# Patient Record
Sex: Female | Born: 1965
Health system: Southern US, Community
[De-identification: ages and names within clinical notes are randomized; demographics above are authoritative.]

## PROBLEM LIST (undated history)

## (undated) DIAGNOSIS — F329 Major depressive disorder, single episode, unspecified: Secondary | ICD-10-CM

## (undated) DIAGNOSIS — K219 Gastro-esophageal reflux disease without esophagitis: Secondary | ICD-10-CM

## (undated) DIAGNOSIS — K439 Ventral hernia without obstruction or gangrene: Secondary | ICD-10-CM

## (undated) DIAGNOSIS — F419 Anxiety disorder, unspecified: Secondary | ICD-10-CM

## (undated) DIAGNOSIS — J449 Chronic obstructive pulmonary disease, unspecified: Secondary | ICD-10-CM

## (undated) DIAGNOSIS — F32A Depression, unspecified: Secondary | ICD-10-CM

## (undated) DIAGNOSIS — E039 Hypothyroidism, unspecified: Secondary | ICD-10-CM

## (undated) DIAGNOSIS — L309 Dermatitis, unspecified: Secondary | ICD-10-CM

## (undated) HISTORY — DX: Anxiety disorder, unspecified: F41.9

## (undated) HISTORY — DX: Gastro-esophageal reflux disease without esophagitis: K21.9

## (undated) HISTORY — PX: TUBAL LIGATION: SHX77

## (undated) HISTORY — DX: Dermatitis, unspecified: L30.9

## (undated) HISTORY — PX: CHOLECYSTECTOMY: SHX55

## (undated) HISTORY — DX: Major depressive disorder, single episode, unspecified: F32.9

## (undated) HISTORY — DX: Depression, unspecified: F32.A

## (undated) HISTORY — DX: Hypothyroidism, unspecified: E03.9

---

## 2005-03-15 ENCOUNTER — Ambulatory Visit: Payer: Self-pay | Admitting: Internal Medicine

## 2005-04-24 ENCOUNTER — Ambulatory Visit: Payer: Self-pay | Admitting: *Deleted

## 2005-04-24 ENCOUNTER — Ambulatory Visit: Payer: Self-pay | Admitting: Internal Medicine

## 2005-05-23 ENCOUNTER — Ambulatory Visit: Payer: Self-pay | Admitting: Obstetrics & Gynecology

## 2005-05-29 ENCOUNTER — Ambulatory Visit (HOSPITAL_COMMUNITY): Admission: RE | Admit: 2005-05-29 | Discharge: 2005-05-29 | Payer: Self-pay | Admitting: Obstetrics and Gynecology

## 2005-06-20 ENCOUNTER — Ambulatory Visit: Payer: Self-pay | Admitting: Obstetrics & Gynecology

## 2005-06-26 ENCOUNTER — Ambulatory Visit (HOSPITAL_COMMUNITY): Admission: RE | Admit: 2005-06-26 | Discharge: 2005-06-26 | Payer: Self-pay | Admitting: *Deleted

## 2005-06-26 ENCOUNTER — Ambulatory Visit: Payer: Self-pay | Admitting: *Deleted

## 2005-07-10 ENCOUNTER — Ambulatory Visit (HOSPITAL_COMMUNITY): Admission: RE | Admit: 2005-07-10 | Discharge: 2005-07-10 | Payer: Self-pay | Admitting: *Deleted

## 2005-07-18 ENCOUNTER — Ambulatory Visit: Payer: Self-pay | Admitting: *Deleted

## 2005-08-09 ENCOUNTER — Ambulatory Visit: Payer: Self-pay | Admitting: Family Medicine

## 2005-09-06 ENCOUNTER — Ambulatory Visit: Payer: Self-pay | Admitting: Family Medicine

## 2005-09-26 ENCOUNTER — Ambulatory Visit: Payer: Self-pay | Admitting: *Deleted

## 2005-10-10 ENCOUNTER — Ambulatory Visit: Payer: Self-pay | Admitting: *Deleted

## 2005-10-24 ENCOUNTER — Ambulatory Visit: Payer: Self-pay | Admitting: Family Medicine

## 2005-11-14 ENCOUNTER — Ambulatory Visit: Payer: Self-pay | Admitting: Obstetrics & Gynecology

## 2005-11-21 ENCOUNTER — Inpatient Hospital Stay (HOSPITAL_COMMUNITY): Admission: AD | Admit: 2005-11-21 | Discharge: 2005-11-24 | Payer: Self-pay | Admitting: Obstetrics and Gynecology

## 2005-11-21 ENCOUNTER — Ambulatory Visit: Payer: Self-pay | Admitting: *Deleted

## 2005-11-21 ENCOUNTER — Ambulatory Visit (HOSPITAL_COMMUNITY): Admission: RE | Admit: 2005-11-21 | Discharge: 2005-11-21 | Payer: Self-pay | Admitting: *Deleted

## 2005-11-21 ENCOUNTER — Ambulatory Visit: Payer: Self-pay | Admitting: Family Medicine

## 2005-11-23 ENCOUNTER — Encounter (INDEPENDENT_AMBULATORY_CARE_PROVIDER_SITE_OTHER): Payer: Self-pay | Admitting: Specialist

## 2006-01-06 ENCOUNTER — Encounter (INDEPENDENT_AMBULATORY_CARE_PROVIDER_SITE_OTHER): Payer: Self-pay | Admitting: *Deleted

## 2006-01-06 LAB — CONVERTED CEMR LAB

## 2006-02-14 ENCOUNTER — Ambulatory Visit: Payer: Self-pay | Admitting: Family Medicine

## 2006-02-26 ENCOUNTER — Encounter: Admission: RE | Admit: 2006-02-26 | Discharge: 2006-02-26 | Payer: Self-pay | Admitting: Surgery

## 2006-04-09 ENCOUNTER — Ambulatory Visit: Payer: Self-pay | Admitting: Sports Medicine

## 2006-05-07 ENCOUNTER — Ambulatory Visit (HOSPITAL_COMMUNITY): Admission: RE | Admit: 2006-05-07 | Discharge: 2006-05-07 | Payer: Self-pay | Admitting: Surgery

## 2006-05-07 ENCOUNTER — Encounter (INDEPENDENT_AMBULATORY_CARE_PROVIDER_SITE_OTHER): Payer: Self-pay | Admitting: Specialist

## 2006-10-10 ENCOUNTER — Ambulatory Visit: Payer: Self-pay | Admitting: Family Medicine

## 2006-11-26 ENCOUNTER — Ambulatory Visit: Payer: Self-pay | Admitting: Family Medicine

## 2006-12-02 ENCOUNTER — Encounter (INDEPENDENT_AMBULATORY_CARE_PROVIDER_SITE_OTHER): Payer: Self-pay | Admitting: *Deleted

## 2006-12-02 ENCOUNTER — Encounter (INDEPENDENT_AMBULATORY_CARE_PROVIDER_SITE_OTHER): Payer: Self-pay | Admitting: Specialist

## 2006-12-02 ENCOUNTER — Ambulatory Visit: Payer: Self-pay | Admitting: Family Medicine

## 2006-12-02 LAB — CONVERTED CEMR LAB
Chlamydia, DNA Probe: NEGATIVE
GC Probe Amp, Genital: NEGATIVE
TSH: 3.692 microintl units/mL (ref 0.350–5.50)

## 2006-12-05 DIAGNOSIS — F17200 Nicotine dependence, unspecified, uncomplicated: Secondary | ICD-10-CM

## 2006-12-05 DIAGNOSIS — F339 Major depressive disorder, recurrent, unspecified: Secondary | ICD-10-CM | POA: Insufficient documentation

## 2006-12-05 DIAGNOSIS — Z720 Tobacco use: Secondary | ICD-10-CM | POA: Insufficient documentation

## 2006-12-05 DIAGNOSIS — E039 Hypothyroidism, unspecified: Secondary | ICD-10-CM | POA: Insufficient documentation

## 2006-12-06 ENCOUNTER — Encounter (INDEPENDENT_AMBULATORY_CARE_PROVIDER_SITE_OTHER): Payer: Self-pay | Admitting: *Deleted

## 2006-12-18 ENCOUNTER — Encounter (INDEPENDENT_AMBULATORY_CARE_PROVIDER_SITE_OTHER): Payer: Self-pay | Admitting: *Deleted

## 2007-01-13 ENCOUNTER — Ambulatory Visit: Payer: Self-pay | Admitting: Sports Medicine

## 2007-01-13 ENCOUNTER — Encounter (INDEPENDENT_AMBULATORY_CARE_PROVIDER_SITE_OTHER): Payer: Self-pay | Admitting: *Deleted

## 2007-01-13 DIAGNOSIS — K219 Gastro-esophageal reflux disease without esophagitis: Secondary | ICD-10-CM | POA: Insufficient documentation

## 2007-01-13 DIAGNOSIS — N951 Menopausal and female climacteric states: Secondary | ICD-10-CM | POA: Insufficient documentation

## 2007-01-13 DIAGNOSIS — F411 Generalized anxiety disorder: Secondary | ICD-10-CM | POA: Insufficient documentation

## 2007-01-13 LAB — CONVERTED CEMR LAB: FSH: 40.3 milliintl units/mL

## 2007-01-15 ENCOUNTER — Encounter (INDEPENDENT_AMBULATORY_CARE_PROVIDER_SITE_OTHER): Payer: Self-pay | Admitting: *Deleted

## 2007-02-10 ENCOUNTER — Telehealth: Payer: Self-pay | Admitting: Psychology

## 2007-02-10 ENCOUNTER — Ambulatory Visit: Payer: Self-pay | Admitting: Family Medicine

## 2007-03-21 ENCOUNTER — Encounter (INDEPENDENT_AMBULATORY_CARE_PROVIDER_SITE_OTHER): Payer: Self-pay | Admitting: *Deleted

## 2007-04-18 ENCOUNTER — Encounter (INDEPENDENT_AMBULATORY_CARE_PROVIDER_SITE_OTHER): Payer: Self-pay | Admitting: *Deleted

## 2007-05-12 ENCOUNTER — Other Ambulatory Visit: Admission: RE | Admit: 2007-05-12 | Discharge: 2007-05-12 | Payer: Self-pay | Admitting: Family Medicine

## 2007-05-12 ENCOUNTER — Ambulatory Visit: Payer: Self-pay | Admitting: Sports Medicine

## 2007-05-12 ENCOUNTER — Encounter (INDEPENDENT_AMBULATORY_CARE_PROVIDER_SITE_OTHER): Payer: Self-pay | Admitting: *Deleted

## 2007-05-12 DIAGNOSIS — G43909 Migraine, unspecified, not intractable, without status migrainosus: Secondary | ICD-10-CM | POA: Insufficient documentation

## 2007-05-12 DIAGNOSIS — R87610 Atypical squamous cells of undetermined significance on cytologic smear of cervix (ASC-US): Secondary | ICD-10-CM | POA: Insufficient documentation

## 2007-05-12 LAB — CONVERTED CEMR LAB
Chlamydia, DNA Probe: NEGATIVE
GC Probe Amp, Genital: NEGATIVE

## 2007-05-15 ENCOUNTER — Encounter (INDEPENDENT_AMBULATORY_CARE_PROVIDER_SITE_OTHER): Payer: Self-pay | Admitting: *Deleted

## 2007-05-23 ENCOUNTER — Encounter (INDEPENDENT_AMBULATORY_CARE_PROVIDER_SITE_OTHER): Payer: Self-pay | Admitting: *Deleted

## 2007-05-23 ENCOUNTER — Ambulatory Visit: Payer: Self-pay | Admitting: Family Medicine

## 2007-05-23 LAB — CONVERTED CEMR LAB: TSH: 3.344 microintl units/mL (ref 0.350–5.50)

## 2007-05-26 ENCOUNTER — Encounter (INDEPENDENT_AMBULATORY_CARE_PROVIDER_SITE_OTHER): Payer: Self-pay | Admitting: *Deleted

## 2007-06-06 ENCOUNTER — Telehealth: Payer: Self-pay | Admitting: *Deleted

## 2007-10-20 ENCOUNTER — Telehealth (INDEPENDENT_AMBULATORY_CARE_PROVIDER_SITE_OTHER): Payer: Self-pay | Admitting: *Deleted

## 2007-10-20 ENCOUNTER — Ambulatory Visit: Payer: Self-pay | Admitting: Family Medicine

## 2007-10-20 ENCOUNTER — Encounter (INDEPENDENT_AMBULATORY_CARE_PROVIDER_SITE_OTHER): Payer: Self-pay | Admitting: *Deleted

## 2007-10-20 DIAGNOSIS — M1990 Unspecified osteoarthritis, unspecified site: Secondary | ICD-10-CM | POA: Insufficient documentation

## 2007-10-24 ENCOUNTER — Encounter (INDEPENDENT_AMBULATORY_CARE_PROVIDER_SITE_OTHER): Payer: Self-pay | Admitting: *Deleted

## 2007-11-28 ENCOUNTER — Encounter (INDEPENDENT_AMBULATORY_CARE_PROVIDER_SITE_OTHER): Payer: Self-pay | Admitting: *Deleted

## 2008-04-27 ENCOUNTER — Encounter: Payer: Self-pay | Admitting: *Deleted

## 2008-04-27 ENCOUNTER — Encounter (INDEPENDENT_AMBULATORY_CARE_PROVIDER_SITE_OTHER): Payer: Self-pay | Admitting: *Deleted

## 2008-06-07 ENCOUNTER — Encounter (INDEPENDENT_AMBULATORY_CARE_PROVIDER_SITE_OTHER): Payer: Self-pay | Admitting: *Deleted

## 2008-06-10 ENCOUNTER — Ambulatory Visit: Payer: Self-pay | Admitting: Family Medicine

## 2008-06-15 ENCOUNTER — Ambulatory Visit: Payer: Self-pay | Admitting: Family Medicine

## 2008-06-15 ENCOUNTER — Encounter (INDEPENDENT_AMBULATORY_CARE_PROVIDER_SITE_OTHER): Payer: Self-pay | Admitting: Family Medicine

## 2008-06-22 ENCOUNTER — Telehealth: Payer: Self-pay | Admitting: *Deleted

## 2008-07-15 ENCOUNTER — Telehealth: Payer: Self-pay | Admitting: *Deleted

## 2008-07-16 ENCOUNTER — Encounter (INDEPENDENT_AMBULATORY_CARE_PROVIDER_SITE_OTHER): Payer: Self-pay | Admitting: Family Medicine

## 2008-07-16 ENCOUNTER — Ambulatory Visit: Payer: Self-pay | Admitting: Family Medicine

## 2008-07-16 LAB — CONVERTED CEMR LAB
HCT: 43.9 % (ref 36.0–46.0)
Hemoglobin: 14.4 g/dL (ref 12.0–15.0)
MCHC: 32.8 g/dL (ref 30.0–36.0)
MCV: 99.5 fL (ref 78.0–100.0)
Platelets: 173 10*3/uL (ref 150–400)
RBC: 4.41 M/uL (ref 3.87–5.11)
RDW: 13.7 % (ref 11.5–15.5)
WBC: 8.1 10*3/uL (ref 4.0–10.5)

## 2008-07-19 ENCOUNTER — Encounter (INDEPENDENT_AMBULATORY_CARE_PROVIDER_SITE_OTHER): Payer: Self-pay | Admitting: Family Medicine

## 2008-09-27 ENCOUNTER — Telehealth: Payer: Self-pay | Admitting: *Deleted

## 2008-09-28 ENCOUNTER — Ambulatory Visit: Payer: Self-pay | Admitting: Family Medicine

## 2008-12-01 ENCOUNTER — Telehealth: Payer: Self-pay | Admitting: Family Medicine

## 2008-12-22 ENCOUNTER — Ambulatory Visit: Payer: Self-pay | Admitting: Family Medicine

## 2009-01-25 ENCOUNTER — Ambulatory Visit: Payer: Self-pay | Admitting: Family Medicine

## 2009-01-25 ENCOUNTER — Encounter (INDEPENDENT_AMBULATORY_CARE_PROVIDER_SITE_OTHER): Payer: Self-pay | Admitting: Family Medicine

## 2009-01-27 ENCOUNTER — Ambulatory Visit: Payer: Self-pay | Admitting: Family Medicine

## 2009-01-31 ENCOUNTER — Encounter: Payer: Self-pay | Admitting: Sports Medicine

## 2009-01-31 ENCOUNTER — Ambulatory Visit: Payer: Self-pay | Admitting: Family Medicine

## 2009-03-04 ENCOUNTER — Telehealth: Payer: Self-pay | Admitting: *Deleted

## 2009-07-28 ENCOUNTER — Encounter: Payer: Self-pay | Admitting: Family Medicine

## 2009-07-28 ENCOUNTER — Ambulatory Visit: Payer: Self-pay | Admitting: Family Medicine

## 2009-07-28 DIAGNOSIS — H6060 Unspecified chronic otitis externa, unspecified ear: Secondary | ICD-10-CM

## 2009-07-28 DIAGNOSIS — H608X9 Other otitis externa, unspecified ear: Secondary | ICD-10-CM | POA: Insufficient documentation

## 2009-07-29 LAB — CONVERTED CEMR LAB
Basophils Absolute: 0 10*3/uL (ref 0.0–0.1)
Basophils Relative: 0 % (ref 0–1)
Eosinophils Absolute: 0.2 10*3/uL (ref 0.0–0.7)
Eosinophils Relative: 4 % (ref 0–5)
HCT: 46 % (ref 36.0–46.0)
Hemoglobin: 14.6 g/dL (ref 12.0–15.0)
Lymphocytes Relative: 26 % (ref 12–46)
Lymphs Abs: 1.6 10*3/uL (ref 0.7–4.0)
MCHC: 31.7 g/dL (ref 30.0–36.0)
MCV: 104.8 fL — ABNORMAL HIGH (ref 78.0–100.0)
Monocytes Absolute: 0.5 10*3/uL (ref 0.1–1.0)
Monocytes Relative: 7 % (ref 3–12)
Neutro Abs: 3.8 10*3/uL (ref 1.7–7.7)
Neutrophils Relative %: 62 % (ref 43–77)
Platelets: 197 10*3/uL (ref 150–400)
RBC: 4.39 M/uL (ref 3.87–5.11)
RDW: 13.8 % (ref 11.5–15.5)
WBC: 6.1 10*3/uL (ref 4.0–10.5)

## 2009-08-09 ENCOUNTER — Telehealth: Payer: Self-pay | Admitting: Family Medicine

## 2010-04-05 ENCOUNTER — Telehealth: Payer: Self-pay | Admitting: Family Medicine

## 2010-04-21 ENCOUNTER — Ambulatory Visit: Payer: Self-pay | Admitting: Family Medicine

## 2010-04-24 ENCOUNTER — Telehealth: Payer: Self-pay | Admitting: Family Medicine

## 2010-05-03 ENCOUNTER — Ambulatory Visit: Payer: Self-pay | Admitting: Family Medicine

## 2010-05-11 ENCOUNTER — Emergency Department (HOSPITAL_COMMUNITY): Admission: EM | Admit: 2010-05-11 | Discharge: 2010-05-11 | Payer: Self-pay | Admitting: Emergency Medicine

## 2010-05-22 ENCOUNTER — Encounter: Payer: Self-pay | Admitting: Family Medicine

## 2010-06-05 ENCOUNTER — Encounter: Payer: Self-pay | Admitting: Family Medicine

## 2010-06-09 ENCOUNTER — Encounter: Payer: Self-pay | Admitting: Family Medicine

## 2010-06-09 ENCOUNTER — Ambulatory Visit: Payer: Self-pay | Admitting: Family Medicine

## 2010-06-09 ENCOUNTER — Other Ambulatory Visit: Admission: RE | Admit: 2010-06-09 | Discharge: 2010-06-09 | Payer: Self-pay | Admitting: Family Medicine

## 2010-06-09 LAB — CONVERTED CEMR LAB
Chlamydia, DNA Probe: NEGATIVE
GC Probe Amp, Genital: NEGATIVE
Pap Smear: NEGATIVE
Whiff Test: NEGATIVE

## 2010-06-13 LAB — CONVERTED CEMR LAB
ALT: 13 units/L (ref 0–35)
AST: 17 units/L (ref 0–37)
Albumin: 3.9 g/dL (ref 3.5–5.2)
Alkaline Phosphatase: 71 units/L (ref 39–117)
BUN: 21 mg/dL (ref 6–23)
CO2: 24 meq/L (ref 19–32)
Calcium: 9 mg/dL (ref 8.4–10.5)
Chloride: 109 meq/L (ref 96–112)
Creatinine, Ser: 0.89 mg/dL (ref 0.40–1.20)
Glucose, Bld: 64 mg/dL — ABNORMAL LOW (ref 70–99)
HCT: 41.6 % (ref 36.0–46.0)
Hemoglobin: 13.3 g/dL (ref 12.0–15.0)
MCHC: 32 g/dL (ref 30.0–36.0)
MCV: 102.2 fL — ABNORMAL HIGH (ref 78.0–100.0)
Platelets: 208 10*3/uL (ref 150–400)
Potassium: 4.5 meq/L (ref 3.5–5.3)
RBC: 4.07 M/uL (ref 3.87–5.11)
RDW: 14.3 % (ref 11.5–15.5)
Sodium: 142 meq/L (ref 135–145)
TSH: 0.057 microintl units/mL — ABNORMAL LOW (ref 0.350–4.500)
Total Bilirubin: 0.2 mg/dL — ABNORMAL LOW (ref 0.3–1.2)
Total Protein: 6.5 g/dL (ref 6.0–8.3)
WBC: 5.4 10*3/uL (ref 4.0–10.5)

## 2010-06-15 ENCOUNTER — Encounter: Payer: Self-pay | Admitting: Family Medicine

## 2010-08-16 ENCOUNTER — Ambulatory Visit: Payer: Self-pay | Admitting: Family Medicine

## 2010-08-16 DIAGNOSIS — N952 Postmenopausal atrophic vaginitis: Secondary | ICD-10-CM | POA: Insufficient documentation

## 2010-08-16 DIAGNOSIS — J209 Acute bronchitis, unspecified: Secondary | ICD-10-CM | POA: Insufficient documentation

## 2010-08-29 ENCOUNTER — Ambulatory Visit: Payer: Self-pay | Admitting: Family Medicine

## 2010-09-19 ENCOUNTER — Ambulatory Visit: Payer: Self-pay

## 2010-10-13 ENCOUNTER — Telehealth: Payer: Self-pay | Admitting: Family Medicine

## 2010-11-07 NOTE — Assessment & Plan Note (Signed)
 Summary: recheck abscess/eo   Vital Signs:  Patient profile:   45 year old female Height:      63.5 inches Weight:      171.0 pounds BMI:     29.92 Temp:     98.2 degrees F Pulse rate:   76 / minute BP sitting:   107 / 70  (left arm)  Vitals Entered By: Avelina Sharps RN (January 27, 2009 2:10 PM) CC: follow up abscess Is Patient Diabetic? No Pain Assessment Patient in pain? yes     Location: right upper back Intensity: 3 Type: soreness   History of Present Illness: 62 female recently seen and had I&D abcess on back  presents for 2 day follow up of wounds.  Has not been using antibiotic nasal ointment because she hasn't bought any yet.  Has been using antibiotic soap however.  Packing was supposed to be slowly pulled out but had completely fallen out when she came.  I do not know how much packing was placed at prior I&D.  No progression of infection and no new abcesses.  Pt has Hx MRSA.  No Fevers/chills/N/V/D/C.  Habits & Providers     Tobacco Status: current     Tobacco Counseling: to quit use of tobacco products     Cigarette Packs/Day: 1.0  Allergies: No Known Drug Allergies  Review of Systems       12 point negative except as in HPI.  Physical Exam  General:  Well-developed,well-nourished,in no acute distress; alert,appropriate and cooperative throughout examination Lungs:  Normal respiratory effort, chest expands symmetrically. Lungs are clear to auscultation, no crackles or wheezes. Heart:  Normal rate and regular rhythm. S1 and S2 normal without gallop, murmur, click, rub or other extra sounds. Abdomen:  Bowel sounds positive,abdomen soft and non-tender without masses, organomegaly or hernias noted. Additional Exam:  I&D'ed abcess seen on back, less painful now for pt but still very erythematous and draining white-yellow pus when squeezed.  No tracking noted.  Informed pt that this would still need some time to drain and that I didn't want it closing so early, and  that she would need to have it re-packed.    Field block performed with  ~7 cc 1% lidocaine .  Prepped and draped in sterile fashion.  Approx 8-10 inches of 1/2 inch packing material placed easily into all 4 quadrants of wound.  Pt tolerated well.  Dressed with folded up 4x4 and tegaderm.  Handful of antibiotic ointment given to patient and she was educated on how to apply it.  She still needs to go and get the Mupirocin from the store.  She is aware and agrees.   Impression & Recommendations:  Problem # 1:  ABSCESS, SKIN (ICD-682.9) Abcess re-packed as it was still draining pus, she is to come back Monday to have  ~1/2 or 4 inches of packing material removed if wound looks good.  Her updated medication list for this problem includes:    Doxycycline  Hyclate 100 Mg Caps (Doxycycline  hyclate) .SABRA... 1 tablet by mouth two times a day x 14 days - take 30 min prior to breakfast and dinner  Orders: [facility]- Est Level  3 (00786)  Complete Medication List: 1)  Synthroid  100 Mcg Tabs (Levothyroxine  sodium) .... One tab by mouth daily 2)  Prilosec Otc 20 Mg Tbec (Omeprazole  magnesium ) .... One tab by mouth daily 3)  Voltaren 75 Mg Tbec (Diclofenac sodium) .... One tab by mouth two times a day prn 4)  Cortisporin 5-10000-1  Susp (Neomycin-polymyxin-hc) .... 4 gtt in each ear (after wicking) qid. dispense one bottle 5)  Citalopram  Hydrobromide 40 Mg Tabs (Citalopram  hydrobromide) .... Take one-half tablet daily for 2 weeks, then one tablet daily 6)  Doxycycline  Hyclate 100 Mg Caps (Doxycycline  hyclate) .SABRA.. 1 tablet by mouth two times a day x 14 days - take 30 min prior to breakfast and dinner 7)  Hibiclens 4 % Liqd (Chlorhexidine gluconate) .... Use for bathing at least twice weekly - disp 1 bottle 8)  Mupirocin 2 % Oint (Mupirocin) .... Apply to each nostril with q-tip two times a day x 5 days - disp 1 tube (qs) 9)  Tramadol Hcl 50 Mg Tabs (Tramadol hcl) .SABRA.. 1 tablet by mouth q6 hrs as needed for pain - do  not drive while taking  Patient Instructions: 1)  Great to meet you today, 2)  We repacked your wound because your previous packing fell out.   3)  I want you to continue using the antibiotic soap, and use the antibiotic ointment in your nose two times a day. 4)  Come back on MONDAY and have approximately 4 inches of your wound packing removed and the wound reinspected. 5)  Call if  you have fevers/chills. 6)  -Dr. ONEIDA.

## 2010-11-07 NOTE — Miscellaneous (Signed)
Summary: [facility] at Derm-they will not reschedule her   Clinical Lists Changes rec'd a message from the doctor's office she had an appt with. she did not keep it & is not eligible to reschedule there.Golden Circle RN  June 05, 2010 4:55 PM  Dr. Clotilde Dieter made referral,- do you think this still needs follow-up with derm? Delbert Harness MD  June 06, 2010 8:24 AM  My suspician was that this is psoriasis. If the patient is not concerned about it I would be fine with her not going. Jamie Brookes MD  June 08, 2010 12:22 PM

## 2010-11-07 NOTE — Assessment & Plan Note (Signed)
Summary: cpe with pap/kh   Vital Signs:  Patient profile:   45 year old female Height:      63.5 inches Weight:      163 pounds BMI:     28.52 Temp:     98.3 degrees F oral Pulse rate:   71 / minute BP sitting:   107 / 70  (right arm) Cuff size:   regular  Vitals Entered By: Jimmy Footman, CMA (June 09, 2010 11:33 AM) CC: cpe/ pap Is Patient Diabetic? No   Primary Care Provider:  Delbert Harness MD  CC:  cpe/ pap.  History of Present Illness: 45 yo here for annual gynecological exam  LMP: Not had for 1 year Contraception: none Regular Menses: none Hx of Anemia: in pregnancy FHx of Breast, Uterine, Cervical or Ovarian Cancer:  No Last Pap:  2008-ASCUS with HPV.  remote history of cervical biopsy- benign per patient. Hx of Abnormal Pap:  No Desires STD testing: yes- has foul smelling vaginal discharge, monogamous relationship Last Mammogram:  never had one Abnormalities on Self-exam:  No Hx of Abnormal Mammogram:  No having hot flashes, somewhat interfereing wiht sleep.  No worsening depression, vaginal dryness.  thyroid:  missed today's dose but has been compliant. No constipation, dry skin, change in hair/ nails    Habits & Providers  Alcohol-Tobacco-Diet     Tobacco Status: current     Cigarette Packs/Day: 1.0  Current Medications (verified): 1)  Synthroid 100 Mcg Tabs (Levothyroxine Sodium) .... One Tab By Mouth Daily. 2)  Citalopram Hydrobromide 40 Mg Tabs (Citalopram Hydrobromide) .... Take One Tablet Daily 3)  Vitamin B-12 2500 Mcg Subl (Cyanocobalamin) .... Once Daily 4)  Cipro Hc 0.2-1 % Susp (Ciprofloxacin-Hydrocortisone) .... 3 Drops in Each Affected Ear 2 Times A Day For 7 Days.  Give 1 Bottle 5)  Loratadine 10 Mg Tabs (Loratadine) .... Take 1 Pill Per Day ( You May To Take At Night If It Makes You Sleepy) 6)  Famotidine 20 Mg Tabs (Famotidine) .... Take 1 Pill Twice A Day For Reflux  Allergies: No Known Drug Allergies PMH-FH-SH reviewed-no changes  except otherwise noted  Social History: Daughter Octavia Heir (DOB: 2/07)  here at [facility].  Daughter (DOB 199) Melissa Dawn Nale in custody of the daughter's father.  Complicated history.  Sister living locally.  She lives in [place].  + Tobacco.  One sexual partner for past 5 years- stable.    Review of Systems      See HPI  Physical Exam  General:  Well-developed,well-nourished,in no acute distress; alert,appropriate and cooperative throughout examination Head:  Normocephalic and atraumatic without obvious abnormalities. No apparent alopecia or balding. Eyes:  No corneal or conjunctival inflammation noted Breasts:  No mass, nodules, thickening, tenderness, bulging, retraction, inflamation, nipple discharge or skin changes noted.   Lungs:  Normal respiratory effort, chest expands symmetrically. Lungs are clear to auscultation, no crackles or wheezes. Heart:  Normal rate and regular rhythm. S1 and S2 normal without gallop, murmur, click, rub or other extra sounds. Abdomen:  Bowel sounds positive,abdomen soft and non-tender without masses, organomegaly or hernias noted. Genitalia:  Pelvic Exam:        External: normal female genitalia without lesions or masses        Vagina: normal without lesions or masses        Cervix: normal without lesions or masses        Adnexa: normal bimanual exam without masses or fullness  Uterus: normal by palpation        Pap smear: performed Extremities:  no LE edema Neurologic:  alert & oriented X3, cranial nerves II-XII intact, and gait normal.   Skin:  closed comedones under arm.  None acutely inflamed.  thickened excoriated erythematous plaque on back of neck, external ears.   Psych:  Oriented X3, memory intact for recent and remote, normally interactive, good eye contact, not anxious appearing, and not depressed appearing.     Impression & Recommendations:  Problem # 1:  Gynecological examination-routine (ICD-V72.31) Hx of abnormal  pap (ASCUS with HR HPV) lost to follow-up for the past several years due to uninsured.  Advised patient if this happens again that there is [facility].  Will recheck pap today. Gave pt info for mammogram screening.  Problem # 2:  UNSPECIFIED VAGINITIS AND VULVOVAGINITIS (ICD-616.10) No evidence of BV, Yeast.  GC/Chlamydia checked.  The following medications were removed from the medication list:    Bactrim Ds 800-160 Mg Tabs (Sulfamethoxazole-trimethoprim) .Marland Kitchen... 2 tablets by mouth two times a day x 10 days    Amoxicillin 500 Mg Caps (Amoxicillin) .Marland Kitchen... Take 1 tablet every 8 hours until all tablets are gone  Orders: GC/Chlamydia-[facility] ([id number]) Wet Prep- [facility] ([id number]) [facility] - Est  40-64 yrs ([id number])  Problem # 3:  HOT FLASHES (ICD-627.2)  Patient would like to pursue herbal remedies.  Asked where a store is located.  Advised [facility] on [address]  Advised if she continues to not get goof releif of symptoms, to follow-up and we will discuss further.  Given handout today on menopause, I stressed regular exercise.  Orders: [facility] - Est  40-64 yrs (14782)  Problem # 4:  HYPOTHYROIDISM, UNSPECIFIED (ICD-244.9) Will recheck TSH.  Her updated medication list for this problem includes:    Synthroid 100 Mcg Tabs (Levothyroxine sodium) ..... One tab by mouth daily.  Orders: Comp Met-[facility] [id number]) TSH-[facility] [id number]) [facility] - Est  40-64 yrs ([id number])  Problem # 5:  Preventive Health Care (ICD-V70.0)  patient states she had a colonoscopy 5 years ago and was told she needed another in 5 years.  She does not remember name of place but she knows where it is located. She will get records to me and we will work on referral for re-screening if indiciated.  Orders: [facility] - Est  40-64 yrs ([id number])  Complete Medication List: 1)  Synthroid 100 Mcg Tabs (Levothyroxine sodium) .... One tab by mouth daily. 2)  Citalopram Hydrobromide 40 Mg Tabs (Citalopram hydrobromide) .... Take one  tablet daily 3)  Vitamin B-12 2500 Mcg Subl (Cyanocobalamin) .... Once daily 4)  Cipro Hc 0.2-1 % Susp (Ciprofloxacin-hydrocortisone) .... 3 drops in each affected ear 2 times a day for 7 days.  give 1 bottle 5)  Loratadine 10 Mg Tabs (Loratadine) .... Take 1 pill per day ( you may to take at night if it makes you sleepy) 6)  Famotidine 20 Mg Tabs (Famotidine) .... Take 1 pill twice a day for reflux  Other Orders: Pap Smear-[facility] ([id number]) CBC-[facility] (34742)  Patient Instructions: 1)  I have refilled your citalopram and Synthroi 2)  Will send you letter if lab results 3)  Make follow-up appt for 1-2 months 4)  Get records of colonoscopy 5)  see info on mammogram Prescriptions: SYNTHROID 100 MCG TABS (LEVOTHYROXINE SODIUM) One tab by mouth daily.  Needs office visit  #30 x 2   Entered and Authorized by:   Delbert Harness MD  Signed by:   Delbert Harness MD on 06/09/2010   Method used:   Electronically to        [facility] [address] (retail)       [address], [facility]       [place], [place]  [address]       Ph: [phone]       Fax: [phone]   RxID:   [id number] CITALOPRAM HYDROBROMIDE 40 MG TABS (CITALOPRAM HYDROBROMIDE) take one tablet daily  #30 x 2   Entered and Authorized by:   Delbert Harness MD   Signed by:   Delbert Harness MD on 06/09/2010   Method used:   Electronically to        [facility] [address] (retail)       [address]       [place], [place]  [address]       Ph: [phone]       Fax: [phone]   RxID:   [id number]   Laboratory Results  Date/Time Received: June 09, 2010 12:11 PM  Date/Time Reported: June 09, 2010 12:21 PM   [employer] Source: vaginal WBC/hpf: 5-10 Bacteria/hpf: 2+  Rods Clue cells/hpf: none  Negative whiff Yeast/hpf: none Trichomonas/hpf: none Comments: ...........test performed by...........Marland KitchenTerese Door, CMA     Appended Document:  cpe with pap/kh     Clinical Lists Changes  Observations: Added new observation of FAMILY HX: F: Stomach and Liver Cancer Mother: Colon Cancer Sister:  Hysterectomy-unknown reason Sister: Liver Cancer Sister- Colon Cancer currently- age 79 (06/13/2010 10:06) Added new observation of PRIMARY MD: Delbert Harness MD (06/13/2010 10:06)         Family History: F: Stomach and Liver Cancer Mother: Colon Cancer Sister:  Hysterectomy-unknown reason Sister: Liver Cancer Sister- Colon Cancer currently- age 69

## 2010-11-07 NOTE — Consult Note (Signed)
Summary: [facility]  [facility]   Imported By: Bradly Bienenstock 05/24/2010 17:27:21  _____________________________________________________________________  External Attachment:    Type:   Image     Comment:   External Document

## 2010-11-07 NOTE — Letter (Signed)
Summary: Results Follow-up Letter  [facility]  [address]   [place], [place] [address]   Phone: [phone]  Fax: [phone]    06/15/2010  [address] [place], [place]  [address]  Dear Ms. Helen Jones,   The following are the results of your recent test(s):  Test     Result     Pap Smear    Normal       Your tests for  gonorrhea, chlamydia and other bloodwork were also all normal.  Sincerely,  Delbert Harness MD [facility]           Appended Document: Results Follow-up Letter mailed.
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
## 2010-11-07 NOTE — Letter (Signed)
Summary: Handout Printed  Printed Handout:  - Abscess-Boil, Care After Surgery

## 2010-11-07 NOTE — Progress Notes (Signed)
Summary: Rx Prob   Phone Note Call from Patient Call back at [phone]   Caller: Patient Summary of Call: Pt got rx Friday for ear drops, but they are 90 dollars and pt cannot afford them.  Can something else be called in that is cheaper.  [facility] [address] [facility].  Is there something on the 4 dollar plan that can be called in. Initial call taken by: Clydell Hakim,  April 24, 2010 11:36 AM  Follow-up for Phone Call        will forward message to  Dr. Clotilde Dieter. Follow-up by: Theresia Lo RN,  April 24, 2010 11:43 AM  Additional Follow-up for Phone Call Additional follow up Details #1::        spoke with patient and worked out better plan. See office not for changes.  Additional Follow-up by: Jamie Brookes MD,  April 24, 2010 12:33 PM

## 2010-11-07 NOTE — Assessment & Plan Note (Signed)
 Summary: FU WOUND/KH   Vital Signs:  Patient profile:   45 year old Jones Height:      63.5 inches Weight:      170.9 pounds BMI:     29.91 Temp:     98.4 degrees F oral Pulse rate:   65 / minute BP sitting:   132 / 85  (left arm)  Vitals Entered By: Letitia Reusing (January 31, 2009 11:07 AM)  CC: f/u wound Is Patient Diabetic? No   History of Present Illness: Helen Jones seen by me last week for wound check comes in for follow up.  At last visit I repacked her wound with approx 1 foot of packing strip (it had fallen out too early previously).  Her wound did well and is hurting less and less each day.  No fevers/chills/n/v.  She is using the antibiotic ointment in her nares but is not yet using the hibaclens soap as she has not yet bought it.  It is still draining fluid with only minimal purulence.  Habits & Providers     Tobacco Status: current     Cigarette Packs/Day: 1.0  Allergies: No Known Drug Allergies  Review of Systems       See HPI  Physical Exam  Ears:  External ear exam shows no significant lesions or deformities.  Otoscopic examination reveals clear canals, tympanic membranes are intact bilaterally without bulging, retraction, inflammation or discharge. Hearing is grossly normal bilaterally. Lungs:  Normal respiratory effort, chest expands symmetrically. Lungs are clear to auscultation, no crackles or wheezes. Heart:  Normal rate and regular rhythm. S1 and S2 normal without gallop, murmur, click, rub or other extra sounds. Extremities:  Wound clean with minimal drainage.  Less fluctuance, improving surrounding erythema.  Only mildly TTP now.  Packing strip seen sticking out.   Impression & Recommendations:  Problem # 1:  ABSCESS, SKIN (ICD-682.9) Assessment Improved Today I pulled out approximately 5 inches of packing material and redressed wound.  Taught pt and her mother how to pull packing strip and redress wound.  They will pull the rest out in 4 days and  continue to change dressings DAILY.  She is also to use warm compresses.  Given handout with instructions on post-surgical care of abcesses.  Will call office if any fevers/chills.  Expect wound to start to close up a couple weeks after packing completely removed.  RTC as needed.  Hibaclens and antibiotic ointment to nares in an attempt to eradicate MRSA.  (Has not bought hibaclens yet because it is expensive but will have enough money to afford it on Friday).  Her updated medication list for this problem includes:    Doxycycline  Hyclate 100 Mg Caps (Doxycycline  hyclate) .SABRA... 1 tablet by mouth two times a day x 14 days - take 30 min prior to breakfast and dinner  Orders: [facility]- Est Level  3 (00786)  Complete Medication List: 1)  Synthroid  100 Mcg Tabs (Levothyroxine  sodium) .... One tab by mouth daily 2)  Prilosec Otc 20 Mg Tbec (Omeprazole  magnesium ) .... One tab by mouth daily 3)  Voltaren 75 Mg Tbec (Diclofenac sodium) .... One tab by mouth two times a day prn 4)  Cortisporin 5-10000-1 Susp (Neomycin-polymyxin-hc) .... 4 gtt in each ear (after w[place]) qid. dispense one bottle 5)  Citalopram  Hydrobromide 40 Mg Tabs (Citalopram  hydrobromide) .... Take one-half tablet daily for 2 weeks, then one tablet daily 6)  Doxycycline  Hyclate 100 Mg Caps (Doxycycline  hyclate) .SABRA.. 1 tablet by mouth two  times a day x 14 days - take 30 min prior to breakfast and dinner 7)  Hibiclens 4 % Liqd (Chlorhexidine gluconate) .... Use for bathing at least twice weekly - disp 1 bottle 8)  Mupirocin 2 % Oint (Mupirocin) .... Apply to each nostril with q-tip two times a day x 5 days - disp 1 tube (qs) 9)  Tramadol Hcl 50 Mg Tabs (Tramadol hcl) .SABRA.. 1 tablet by mouth q6 hrs as needed for pain - do not drive while taking  Patient Instructions: 1)  Great to see you today.  Your wound looks great.  I pulled about 4-5 inches out today.  There is still another 4-5 inches left in there.  I want you to change the dressings  daily as we discussed.  Use the antibiotic ointment.  I then want you to pull out the rest of the packing material in 4 days.  Continue to change the dressings daily.  Call the office if you develop any fevers/chills.  The wound will slowly close on its own over the next couple weeks.   2)  Be sure to get the hibaclens soap. 3)  -Dr. ONEIDA.

## 2010-11-07 NOTE — Assessment & Plan Note (Signed)
Summary: F/U multiple issues/KH   Vital Signs:  Patient profile:   45 year old female Weight:      164 pounds Temp:     98.1 degrees F oral Pulse rate:   60 / minute Pulse rhythm:   regular BP sitting:   105 / 66  (left arm)  Vitals Entered By: Loralee Pacas CMA (August 16, 2010 2:18 PM) CC: follow-up visit Comments cold that went away and has come back, wants to know if she can get a z-pack. she has an issue with swelling on the right side of her stomach x 2 weeks no trauma to the area.     Primary Care Alexi Geibel:  Delbert Harness MD  CC:  follow-up visit.  History of Present Illness: 45 yo here to discuss multiple complaints:  nasal congestion: 2 weeks, headaches. productive cough, dyspnea.  +purulent rhinorrhea, no fever  + smoker.  no second sickening.  left upper abdomen: soreness only when she touches it.  no pain no rash no n/v/d.  No trauma  left leg numb and achy when she sleep on that side, back hurts in the morning sometimes.  resolves withing minutes or switching sides.  No claudication, abnormal legmovements, weakness.  vaginal dryness: notices with interourse, uses lubrication, adeqauetely controls  migraines- uses goody's powder regulalry, wants to know what else she can take.    ear canal itchy:  uses bobby pins to scratch. no hearin loss, drainage.  tobacco abuse: smokes 1 ppd.  interested in quitting    Habits & Providers  Alcohol-Tobacco-Diet     Tobacco Status: current     Tobacco Counseling: to quit use of tobacco products     Cigarette Packs/Day: 1.0  Exercise-Depression-Behavior     Does Patient Exercise: no     Exercise Counseling: to improve exercise regimen     Have you felt down or hopeless? no     Have you felt little pleasure in things? no     Depression Counseling: not indicated; screening negative for depression     Seat Belt Use: always  Current Medications (verified): 1)  Synthroid 88 Mcg Tabs (Levothyroxine Sodium) .... Take One  Tablet Daily 2)  Citalopram Hydrobromide 40 Mg Tabs (Citalopram Hydrobromide) .... Take One Tablet Daily 3)  Vitamin B-12 2500 Mcg Subl (Cyanocobalamin) .... Once Daily 4)  Cipro Hc 0.2-1 % Susp (Ciprofloxacin-Hydrocortisone) .... 3 Drops in Each Affected Ear 2 Times A Day For 7 Days.  Give 1 Bottle 5)  Loratadine 10 Mg Tabs (Loratadine) .... Take 1 Pill Per Day ( You May To Take At Night If It Makes You Sleepy) 6)  Famotidine 20 Mg Tabs (Famotidine) .... Take 1 Pill Twice A Day For Reflux 7)  Azithromycin 250 Mg Tabs (Azithromycin) .... 2 Tabs On The First Day and One Tab Daily For 4 Days 8)  Hydrocortisone-Acetic Acid 1-2 % Soln (Hydrocortisone-Acetic Acid) .... Apply 5 Drops To Each Ear 3 Times Daily  Allergies: No Known Drug Allergies  Past History:  Past Surgical History: s/p BTL lap cholecystecomy PMH-FH-SH reviewed for relevance  Social History: Risk analyst Use:  always Does Patient Exercise:  no  Review of Systems      See HPI  Physical Exam  General:  Well-developed,well-nourished,in no acute distress; alert,appropriate and cooperative throughout examination Ears:  irrtated excoriated ear canals with some external dermatitis Nose:  External nasal examination shows no deformity or inflammation. Nasal mucosa are pink and moist without lesions or exudates. Mouth:  Oral mucosa and oropharynx without lesions or exudates\par Lungs:  Normal respiratory effort, chest expands symmetrically. Lungs are clear to auscultation, no crackles or wheezes. Heart:  Normal rate and regular rhythm. S1 and S2 normal without gallop, murmur, click, rub or other extra sounds. Abdomen:  Bowel sounds positive,abdomen soft  without masses, organomegaly or hernias noted. Mildy tenderin RUQ   Impression & Recommendations:  Problem # 1:  OTITIS EXTERNA, CHRONIC (ICD-380.23)  advised avoiding placing objects in ears.  Vosol for itch, may use this for external ears as well or use 1%  hydrocortisone  Orders: [facility]- Est  Level 4 (99214)  Problem # 2:  ACUTE BRONCHITIS (ICD-466.0)  Will prescribe azithromycin given 2 week duration.  Low suspicion for serious bacterial infection.  Discussed effect on smoking on frequent infections.  Patient would like to discuss smoking cessationin further detail  Her updated medication list for this problem includes:    Azithromycin 250 Mg Tabs (Azithromycin) .Marland Kitchen... 2 tabs on the first day and one tab daily for 4 days  Orders: [facility]- Est  Level 4 (04540)  Problem # 3:  VAGINITIS, ATROPHIC, POSTMENOPAUSAL (ICD-627.3)  advised lubrication for intercourse, vaginal moisturizer.  Patient to return if not improving and will discuss need for topical estrogen.  Orders: [facility]- Est  Level 4 (98119)  Problem # 4:  TOBACCO DEPENDENCE (ICD-305.1)  did not have time to counsel today.  will refer to pharm clinic.  Orders: [facility]- Est  Level 4 (99214)  Problem # 5:  MIGRAINE NOS W/O INTRACTABLE MIGRAINE (ICD-346.90)  asked patient to keep headache diary, reduce use of goody's powder and caffeine, and return for visit to discuss only headache.  Orders: [facility]- Est  Level 4 (99214)  Complete Medication List: 1)  Synthroid 88 Mcg Tabs (Levothyroxine sodium) .... Take one tablet daily 2)  Citalopram Hydrobromide 40 Mg Tabs (Citalopram hydrobromide) .... Take one tablet daily 3)  Vitamin B-12 2500 Mcg Subl (Cyanocobalamin) .... Once daily 4)  Loratadine 10 Mg Tabs (Loratadine) .... Take 1 pill per day ( you may to take at night if it makes you sleepy) 5)  Famotidine 20 Mg Tabs (Famotidine) .... Take 1 pill twice a day for reflux 6)  Azithromycin 250 Mg Tabs (Azithromycin) .... 2 tabs on the first day and one tab daily for 4 days 7)  Hydrocortisone-acetic Acid 1-2 % Soln (Hydrocortisone-acetic acid) .... Apply 5 drops to each ear 3 times daily  Patient Instructions: 1)  make appointment for Dr. Raymondo Band to talk about smoking cessation 2)  Will prescribe  azuthromycine for your cough 3)  prescribed ear drops for your itchy ear- do not put anythign in your ear- this includes bobby pins 4)  keep migraine log and make follow-up appt to discuss 5)  uise lots of lubrication- try silicone based for more vaginal dryness.  May also try vaginal lotions such as rephresh 6)  use some 1% hydrocortison cream on yoru elbow and on your ear on the outsdie Prescriptions: HYDROCORTISONE-ACETIC ACID 1-2 % SOLN (HYDROCORTISONE-ACETIC ACID) apply 5 drops to each ear 3 times daily  #1 x 1   Entered and Authorized by:   Delbert Harness MD   Signed by:   Delbert Harness MD on 08/16/2010   Method used:   Electronically to        [facility] [address] (retail)       [address]       [place], [place]  [address]       Ph: [phone]       Fax: [phone]   RxID:   [id number] AZITHROMYCIN 250 MG TABS (AZITHROMYCIN) 2 tabs on the first day and one tab daily for 4 days  #6 x 0   Entered and Authorized by:   Delbert Harness MD   Signed by:   Delbert Harness MD on 08/16/2010   Method used:   Electronically to        [facility] [address] (retail)       [address], [address]       [place], [place]  [address]       Ph: [phone]       Fax: [phone]   RxID:   [id number]    Orders Added: 1)  [facility]- Est  Level 4 [88416]

## 2010-11-07 NOTE — Assessment & Plan Note (Signed)
Summary: hearing loss?/eo   Vital Signs:  Patient profile:   45 year old female Height:      63.5 inches Weight:      162 pounds BMI:     28.35 BSA:     1.78 Temp:     98.4 degrees F Pulse rate:   59 / minute BP sitting:   101 / 65  Vitals Entered By: Jone Baseman CMA (April 21, 2010 3:49 PM) CC: ? hearing loss  Hearing Screen  20db HL: Left  500 hz: No Response 1000 hz: No Response 2000 hz: No Response 4000 hz: No Response Right  500 hz: No Response 1000 hz: No Response 2000 hz: 40db 4000 hz: No Response   Hearing Testing Entered By: Jone Baseman CMA (April 21, 2010 3:49 PM)   Primary Care Provider:  Delbert Harness MD  CC:  ? hearing loss.  History of Present Illness: [place] and was swimming while she was there. She has been back now for about a week and is having some hearing loss, some occasional Rt ear pain that is sharp and lasts seconds. She has tried taking Mucinex for her congestion and drainage but it is not helping. She failed her hearing test today in our clinic. She recounts that her Rt ear drained on her pillow [place]. She has not had any fever or chills. She sometimes hears a ringing in her ears when standing up fast ever since 4th of July. Says she was not close to fireworks.   Current Medications (verified): 1)  Synthroid 100 Mcg Tabs (Levothyroxine Sodium) .... One Tab By Mouth Daily.  Needs Office Visit 2)  Ciprodex 0.3-0.1 % Susp (Ciprofloxacin-Dexamethasone) .... 4 Drops Into Affected Ear Two Times A Day X 7 Days Disp: Quantity Sufficient 3)  Citalopram Hydrobromide 40 Mg Tabs (Citalopram Hydrobromide) .... Take One Tablet Daily 4)  Bactrim Ds 800-160 Mg Tabs (Sulfamethoxazole-Trimethoprim) .... 2 Tablets By Mouth Two Times A Day X 10 Days 5)  Vitamin B-12 2500 Mcg Subl (Cyanocobalamin) .... Once Daily 6)  Amoxicillin 500 Mg Caps (Amoxicillin) .... Take 1 Tablet Every 8 Hours  Until All Tablets Are Gone 7)  Cipro Hc 0.2-1 % Susp (Ciprofloxacin-Hydrocortisone) .... 3 Drops in Each Affected Ear 2 Times A Day For 7 Days.  Give 1 Bottle 8)  Antipyrine-Benzocaine 5.4-1.4 % Soln (Benzocaine-Antipyrine) .... Drop 2-4 Drops in Affected Ears 4 Times A Day For Pain Relief 1 Bottle  Allergies (verified): No Known Drug Allergies  Review of Systems        vitals reviewed and pertinent negatives and positives seen in HPI   Physical Exam  General:  Well-developed,well-nourished,in no acute distress; alert,appropriate and cooperative throughout examination Ears:  Hearing is decreased. She has swollen ear canals. difficult to see TM's. no drainage is seen.  Nose:  External nasal examination shows no deformity or inflammation. Nasal mucosa are pink and moist without lesions or exudates. Mouth:  Oral mucosa and oropharynx without lesions or exudates.  Teeth in good repair.   Impression & Recommendations:  Problem # 1:  OTITIS EXTERNA, CHRONIC (ICD-380.23) Assessment New Pt has very narrow ear canals. Admits to scratching her ear canals when they itch with a bobby-pin. She may be causing scaring in her canals. Planned to use Hydrocortisone drops to decrease the inflammation but it was too expensive. Changed to Cipro/Hydrocortisone drops and hoping those are cheaper. Will see the  patient back in 1 week.   Orders: [facility]- Est Level  3 (04540)  Problem # 2:  OTITIS MEDIA, SUPPURATIVE, CHRONIC (ICD-382.3) Assessment: New Pt reports drainage coming from her Rt [place] She had been swimming and has an URI. I suspect combo of AOM and otitis externa. Plan to address it from different angles. Will give Amox for 10 days, and Cipro/Hydrocortisone otic as well as antipyrine/benzocaine for the pain. Will see her back in 1 week.   Her updated medication list for this problem includes:    Bactrim Ds 800-160 Mg Tabs (Sulfamethoxazole-trimethoprim) .Marland Kitchen...  2 tablets by mouth two times a day x 10 days    Amoxicillin 500 Mg Caps (Amoxicillin) .Marland Kitchen... Take 1 tablet every 8 hours until all tablets are gone  Orders: [facility]- Est Level  3 (98119)  Complete Medication List: 1)  Synthroid 100 Mcg Tabs (Levothyroxine sodium) .... One tab by mouth daily.  needs office visit 2)  Ciprodex 0.3-0.1 % Susp (Ciprofloxacin-dexamethasone) .... 4 drops into affected ear two times a day x 7 days disp: quantity sufficient 3)  Citalopram Hydrobromide 40 Mg Tabs (Citalopram hydrobromide) .... Take one tablet daily 4)  Bactrim Ds 800-160 Mg Tabs (Sulfamethoxazole-trimethoprim) .... 2 tablets by mouth two times a day x 10 days 5)  Vitamin B-12 2500 Mcg Subl (Cyanocobalamin) .... Once daily 6)  Amoxicillin 500 Mg Caps (Amoxicillin) .... Take 1 tablet every 8 hours until all tablets are gone 7)  Cipro Hc 0.2-1 % Susp (Ciprofloxacin-hydrocortisone) .... 3 drops in each affected ear 2 times a day for 7 days.  give 1 bottle 8)  Antipyrine-benzocaine 5.4-1.4 % Soln (Benzocaine-antipyrine) .... Drop 2-4 drops in affected ears 4 times a day for pain relief 1 bottle  Other Orders: Hearing- [facility] (14782)  Patient Instructions: 1)  put the drops in the ears for the next 10 days.  2)  come back to see me on Wednesday of next week so I can check you ears to see the tympanic membranes.  Prescriptions: ANTIPYRINE-BENZOCAINE 5.4-1.4 % SOLN (BENZOCAINE-ANTIPYRINE) drop 2-4 drops in affected ears 4 times a day for pain relief 1 bottle  #1 x 0   Entered and Authorized by:   Jamie Brookes MD   Signed by:   Jamie Brookes MD on 04/24/2010   Method used:   Electronically to        [facility] [address] (retail)       [address]       [place], [place]  [address]       Ph: [phone]       Fax: [phone]   RxID:   [id number] CIPRO HC 0.2-1 % SUSP (CIPROFLOXACIN-HYDROCORTISONE) 3 drops in each affected ear 2 times a day for 7 days.  Give  1 bottle  #1 x 0   Entered and Authorized by:   Jamie Brookes MD   Signed by:   Jamie Brookes MD on 04/24/2010   Method used:   Electronically to        [facility] [address] ([facility]ail)       [address], [address]       [place], [place]  [address]       Ph: [phone]       Fax: [phone]   RxID:   [id number] AMOXICILLIN 500 MG  CAPS (AMOXICILLIN) take 1 tablet every 8 hours until all tablets are gone  #30 x 0   Entered and Authorized by:   Jamie Brookes MD   Signed by:   Jamie Brookes MD on 04/24/2010   Method used:   Electronically to        [facility] [address] ([facility]ail)       [address]       [place], [place]  [address]       Ph: [phone]       Fax: [phone]   RxID:   [id number] CITALOPRAM HYDROBROMIDE 40 MG TABS (CITALOPRAM HYDROBROMIDE) take one tablet daily  #30 x 0   Entered and Authorized by:   Jamie Brookes MD   Signed by:   Jamie Brookes MD on 04/21/2010   Method used:   Electronically to        [facility] [address] ([facility]ail)       [address], [address]       [place], [place]  [address]       Ph: [phone]       Fax: [phone]   RxID:   [id number] SYNTHROID 100 MCG TABS (LEVOTHYROXINE SODIUM) One tab by mouth daily.  Needs office visit  #30 x 0   Entered and Authorized by:   Jamie Brookes MD   Signed by:   Jamie Brookes MD on 04/21/2010   Method used:   Electronically to        [facility] [address] ([facility])       [address], [address]       [place], [place]  [address]       Ph: [phone]       Fax: [phone]   RxID:   [id number] CORTISPORIN-TC 3.12-08-08-0.5 MG/ML SUSP (NEOMYCIN-COLIST-HC-THONZONIUM) put 3 drops in each ear 4 times a day give 1 large bottle  #1 x 0   Entered and Authorized by:   Jamie Brookes MD   Signed by:   Jamie Brookes MD on 04/21/2010   Method used:    Electronically to        [facility] [address])       [address], [address]       [place], [place]  [address]       Ph: [phone]       Fax: [phone]   RxID:   [id number]

## 2010-11-07 NOTE — Progress Notes (Signed)
Summary: refill  Medications Added SYNTHROID 100 MCG TABS (LEVOTHYROXINE SODIUM) One tab by mouth daily.  Needs office visit       Phone Note Refill Request Call back at Home Phone [phone] Message from:  Patient  Refills Requested: Medication #1:  SYNTHROID 100 MCG TABS One tab by mouth daily Initial call taken by: De Nurse,  April 05, 2010 10:12 AM    New/Updated Medications: SYNTHROID 100 MCG TABS (LEVOTHYROXINE SODIUM) One tab by mouth daily.  Needs office visit Prescriptions: SYNTHROID 100 MCG TABS (LEVOTHYROXINE SODIUM) One tab by mouth daily.  Needs office visit  #30 x 0   Entered and Authorized by:   Delbert Harness MD   Signed by:   Delbert Harness MD on 04/05/2010   Method used:   Electronically to        [facility] [address] ([facility]ail)       [address], [address]       [place], [place]  [address]       Ph: [phone]       Fax: [phone]   RxID:   [id number]

## 2010-11-07 NOTE — Assessment & Plan Note (Signed)
Summary: Smoking Cessation - rx Clinic   Vital Signs:  Patient profile:   45 year old female Weight:      166.5 pounds Pulse rate:   83 / minute BP sitting:   122 / 79  (left arm)  Primary Care Provider:  Delbert Harness MD   History of Present Illness: Patient is in good spirits.  Patient started smoking when she was 45 yo and has smoked at least 1 ppd for 33 years. She has cut down from 3 ppd to 1 ppd.Patient tried Chantix in past and it helped her cut down from 3 ppd to 1/2 ppd.  Current source of stress is civil court dispute involving her daughter.  She believes that once this is out of her life, quitting smoking will be easy.  She thinks that quitting smoking will help her headaches and prevent development of COPD.   She is confident in her ability to quit smoking and is ready to quit within the next 2 weeks.          Habits & Providers  Alcohol-Tobacco-Diet     Tobacco Status: current     Cigarette Packs/Day: 2     Year Started: 1978     Pack years: 47  Current Medications (verified): 1)  Synthroid 88 Mcg Tabs (Levothyroxine Sodium) .... Take One Tablet Daily 2)  Citalopram Hydrobromide 40 Mg Tabs (Citalopram Hydrobromide) .... Take One Tablet Daily 3)  Vitamin B-12 2500 Mcg Subl (Cyanocobalamin) .... Once Daily 4)  Loratadine 10 Mg Tabs (Loratadine) .... Take 1 Pill Per Day ( You May To Take At Night If It Makes You Sleepy) 5)  Famotidine 20 Mg Tabs (Famotidine) .... Take 1 Pill Twice A Day For Reflux 6)  Hydrocortisone-Acetic Acid 1-2 % Soln (Hydrocortisone-Acetic Acid) .... Apply 5 Drops To Each Ear 3 Times Daily 7)  Bc Fast Pain Relief 650-195-33.3 Mg Pack (Aspirin-Salicylamide-Caffeine) .... Use Is 6-10 Packs Per Day  Allergies (verified): No Known Drug Allergies  Social History: Packs/Day:  2   Impression & Recommendations:  Problem # 1:  TOBACCO DEPENDENCE (ICD-305.1) Assessment Unchanged  Severe Nicotine Abuse of 33 years duration in a patient who is good  candidate for success b/c of readiness and confidence in her ability to quit.  Initiated varenicline. Counseled on purpose, proper use, and potential adverse effects, including nausea.  Cautioned regarding CNS effects.  Advised to call office if any mood changes or suicidal ideation.  Written information provided:  F/U Rx Clinic Visit:  Mid December   Total time with patient in face-to-face counseling:  25 minutes.  Patient seen with:  Linward Headland, PharmD candidate  Her updated medication list for this problem includes:    Chantix Starting Month Pak 0.5 Mg X 11 & 1 Mg X 42 Tabs (Varenicline tartrate) ..... Use as directed.    Chantix Continuing Month Pak 1 Mg Tabs (Varenicline tartrate) ..... Use as directed.  Orders: Inital Assessment Each - [facility] [id number])  Problem # 2:  MIGRAINE NOS W/O INTRACTABLE MIGRAINE (ICD-346.90) Assessment: Unchanged  Patient is currently experiencing headaches on a daily basis.  She could be suffering from combination of migraine and analgesic rebound headaches.  She currently uses 6-10 packs of BC powder daily to treat headaches.  Further work-up of headaches will be needed after patient stops smoking.  Goal is to decrease use of BC powder to prevent analgesic rebound headaches.   Her updated medication list for this problem includes:    Bc Fast  Pain Relief 650-195-33.3 Mg Pack (Aspirin-salicylamide-caffeine) ..... Use is 6-10 packs per day  Orders: Inital Assessment Each - [facility] ([address])  Complete Medication List: 1)  Synthroid 88 Mcg Tabs (Levothyroxine sodium) .... Take one tablet daily 2)  Citalopram Hydrobromide 40 Mg Tabs (Citalopram hydrobromide) .... Take one tablet daily 3)  Vitamin B-12 2500 Mcg Subl (Cyanocobalamin) .... Once daily 4)  Loratadine 10 Mg Tabs (Loratadine) .... Take 1 pill per day ( you may to take at night if it makes you sleepy) 5)  Famotidine 20 Mg Tabs (Famotidine) .... Take 1 pill twice a day for reflux 6)   Hydrocortisone-acetic Acid 1-2 % Soln (Hydrocortisone-acetic acid) .... Apply 5 drops to each ear 3 times daily 7)  Bc Fast Pain Relief 650-195-33.3 Mg Pack (Aspirin-salicylamide-caffeine) .... Use is 6-10 packs per day 8)  Chantix Starting Month Pak 0.5 Mg X 11 & 1 Mg X 42 Tabs (Varenicline tartrate) .... Use as directed. 9)  Chantix Continuing Month Pak 1 Mg Tabs (Varenicline tartrate) .... Use as directed.  Patient Instructions: 1)  Quit Date December 1st 2)  Start chantix as soon as you can pick it up.  3)  Follow up with Rx Clinic in second week fo December.  Prescriptions: CHANTIX CONTINUING MONTH PAK 1 MG TABS (VARENICLINE TARTRATE) use as directed.  #1 x 1   Entered by:   Christian Mate D   Authorized by:   Delbert Harness MD   Signed by:   Madelon Lips Pharm D on 08/29/2010   Method used:   Electronically to        [facility] [address] (retail)       [address], [address]       [place], [place]  [address]       Ph: [phone]       Fax: [phone]   RxID:   [id number] CHANTIX STARTING MONTH PAK 0.5 MG X 11 & 1 MG X 42 TABS (VARENICLINE TARTRATE) use as directed.  #1 x 0   Entered by:   Madelon Lips Pharm D   Authorized by:   Delbert Harness MD   Signed by:   Madelon Lips Pharm D on 08/29/2010   Method used:   Electronically to        [facility] [address] (retail)       [address], [address]       [place], [place]  [address]       Ph: [phone]       Fax: [phone]   RxID:   [id number] BC FAST PAIN RELIEF [phone] MG PACK (ASPIRIN-SALICYLAMIDE-CAFFEINE) Use is 6-10 packs per day  #1 x 0   Entered and Authorized by:   Christian Mate D   Signed by:   Madelon Lips Pharm D on 08/29/2010   Method used:   Historical   RxID:   [id number]    Orders Added: 1)  Inital Assessment Each - [facility] [[address]]    Tobacco Counseling   Currently uses tobacco.    Cigarettes      Year  started smoking cigarettes:      1978     Number of packs per day smoked:      2     Years smoked:            33     Packs  per year:          730     Pack Years:            58  Counseled to quit/cut down on tobacco use.   Readiness to Quit:     Very Ready Cessation Stage:     Action Target Quit Date:     09/07/2010  Quitting Barriers:      -  stress  Quitting Motivators:      -  poor health     -  children     -  healthier lifestyle  Previous Quit Attempts   Previously Tried to quit:     Yes # of Previous quit attempts:     1  Quit Methods Tried:      -  Chantix  Reason for restarting:      -  stress  Comments: to quit use of tobacco products  Smoking Cessation Plan   Readiness:     Very Ready Cessation Stage:   Action Target Quit Date:   09/07/2010 New Medications: BC FAST PAIN RELIEF 650-195-33.3 MG PACK (ASPIRIN-SALICYLAMIDE-CAFFEINE) Use is 6-10 packs per day CHANTIX STARTING MONTH PAK 0.5 MG X 11 & 1 MG X 42 TABS (VARENICLINE TARTRATE) use as directed. CHANTIX CONTINUING MONTH PAK 1 MG TABS (VARENICLINE TARTRATE) use as directed.  Medications Added this Update:  BC FAST PAIN RELIEF 650-195-33.3 MG PACK (ASPIRIN-SALICYLAMIDE-CAFFEINE) Use is 6-10 packs per day CHANTIX STARTING MONTH PAK 0.5 MG X 11 & 1 MG X 42 TABS (VARENICLINE TARTRATE) use as directed. CHANTIX CONTINUING MONTH PAK 1 MG TABS (VARENICLINE TARTRATE) use as directed.  Orders Added this Update:  Inital Assessment Each [facility] [[address]]

## 2010-11-09 NOTE — Progress Notes (Signed)
Summary: Rx   Phone Note Refill Request Call back at [phone]   Refills Requested: Medication #1:  CITALOPRAM HYDROBROMIDE 40 MG TABS take one tablet daily Initial call taken by: Knox Royalty,  October 13, 2010 1:42 PM    Prescriptions: CITALOPRAM HYDROBROMIDE 40 MG TABS (CITALOPRAM HYDROBROMIDE) take one tablet daily  #30 x 2   Entered and Authorized by:   Delbert Harness MD   Signed by:   Delbert Harness MD on 10/13/2010   Method used:   Electronically to        [facility] [address] (retail)       [address], [address]       [place], [place]  [address]       Ph: [phone]       Fax: [phone]   RxID:   [id number]

## 2011-01-11 LAB — GLUCOSE, CAPILLARY: Glucose-Capillary: 82 mg/dL (ref 70–99)

## 2011-02-03 ENCOUNTER — Other Ambulatory Visit: Payer: Self-pay | Admitting: Family Medicine

## 2011-02-03 NOTE — Telephone Encounter (Signed)
Refill request

## 2011-02-23 NOTE — Op Note (Signed)
NAMEDELAYZA, LUNGREN NO.:  [id number]   MEDICAL RECORD NO.:  [id number]          PATIENT TYPE:  INP   LOCATION:  9128                          [facility]ACILITY:  [facility]   PHYSICIAN:  Conni Elliot, M.D.DATE OF BIRTH:  11/19/1965   DATE OF PROCEDURE:  11/23/2005  DATE OF DISCHARGE:                                 OPERATIVE REPORT   PREOPERATIVE DIAGNOSIS:  Multiparity, desires permanent sterilization.   POSTOPERATIVE DIAGNOSIS:  Multiparity, desires permanent sterilization.   PROCEDURE:  Postpartum tubal ligation.   SURGEON:  Conni Elliot, M.D.   ASSISTANT:  Marc Morgans. Mayford Knife, M.D.   ANESTHESIA:  Epidural and 6 mL of local.   COMPLICATIONS:  None.   ESTIMATED BLOOD LOSS:  Minimal.   FLUIDS:  1700 mL lactated Ringer's.   INDICATIONS:  The patient is a 45 year old G19, P2-0-2-2, at 63 weeks'  gestational age, who is status post normal spontaneous vaginal delivery  yesterday.  She desires permanent sterilization.  This risks and benefits of  the procedure were discussed with the patient, including risks failure at  three to five per 1000 with increased risk for ectopic gestation if  pregnancy occurs.   FINDINGS:  Normal uterus, tubes and ovaries.   PROCEDURE:  The patient was taken to the operating room, where epidural  anesthesia was found to be adequate.  A small transverse infraumbilical skin  incision was then made with a scalpel.  The incision was carried down to the  underlying layer of fascia until the peritoneum was then identified and  entered.  The peritoneum was noted to be free of any adhesions and the  incision was then extended with Metzenbaum scissors.   The patient's right fallopian tube was then identified, brought to the  incision and grasped with Babcock clamps.  The mesosalpinx was traversed  with a 2-0 chromic on an SH needle and an approximate 3 cm section was  ligated.  Next, a free tie of 1-0 chromic was used to ligate just  inferior  to which revealed it had previously been tied off.  The Metzenbaum scissors  were used to excise that part of the tube.  Good hemostasis was noted and  the tube was returned to the abdomen.  The left fallopian tube was then  ligated in the same fashion and an approximate 3 cm segment was excised in a  similar fashion.  Excellent hemostasis was noted and the tube returned to  the abdomen.  The peritoneum was closed with an 0 chromic on a CT1.  The  fascia was closed with a 1-0 chromic on a CT1.  The subcutaneous tissue was  closed with a plain suture.  The skin was closed with 4-0 Vicryl.   The patient tolerated the procedure well.  Sponge, lap and needle counts  were correct x2.  The patient was taken to the recovery room in stable  condition.     ______________________________  Marc Morgans Mayford Knife, M.D.    ______________________________  Conni Elliot, M.D.    TLW/MEDQ  D:  11/23/2005  T:  11/23/2005  Job:  [id number]

## 2011-02-23 NOTE — Op Note (Signed)
Helen Jones, Helen Jones             ACCOUNT NO.:  [id number]   MEDICAL RECORD NO.:  [id number]          PATIENT TYPE:  OIB   LOCATION:  2550                         FACILITY:  [facility]   PHYSICIAN:  Wilmon Arms. Corliss Skains, M.D. DATE OF BIRTH:  [birth year]   DATE OF PROCEDURE:  05/07/2006  DATE OF DISCHARGE:                                 OPERATIVE REPORT   PREOPERATIVE DIAGNOSIS:  Chronic calculus cholecystitis.   POSTOPERATIVE DIAGNOSIS:  Chronic calculus cholecystitis.   PROCEDURE PERFORMED:  Laparoscopic cholecystectomy with intraoperative  cholangiogram.   SURGEON:  Wilmon Arms. Corliss Skains, M.D.   ASSISTANT:  Dr. Kendrick Ranch.   ANESTHESIA:  General endotracheal.   INDICATIONS:  The patient is a 45 year old female who presents with a 3-year  history of documented gallstones.  She did not have surgery due to the fact  that she lacked insurance.  She continues to have intermittent symptoms of  right upper quadrant pain radiating through to her back associated with  nausea.  Ultrasound confirmed marked cholelithiasis.  I strongly recommended  the patient she had elective cholecystectomy regardless of her financial  situation.   DESCRIPTION OF PROCEDURE:  The patient brought to the operating room and  placed in supine position on the operating table.  After adequate level of  general anesthesia  was obtained, the patient's abdomen was prepped with  Betadine and draped in sterile fashion.  The patient has had previous  infraumbilical incision.  This area was infiltrated 0.25% Marcaine and was  opened with a scalpel.  Dissection was carried down the fascia which was  opened vertically.  The peritoneal cavity was entered and a stay sutures of  0 Vicryl was placed around the fascial opening.  The Hasson cannula was  inserted and secured with a stay suture.  Pneumoperitoneum is obtained by  insufflating CO2 maintaining maximal pressure of 15 mmHg.  Laparoscope was  inserted and the patient was  rotated in reverse Trendelenburg position and  tilted slightly to her left.  The laparoscopic examination of the liver and  stomach showed that they were grossly normal.  A 10 mm port was placed in  the subxiphoid position, two 5 mm ports in the right upper quadrant.  The  gallbladder was grasped with a clamp and elevated over the edge of the  liver.  The gallbladder was small but seemed packed full of gallstones.  The  peritoneum around the hilum of the gallbladder was opened.  The cystic duct  was identified and was circumferentially dissected and ligated with a clip  distally.  A small opening was created on the cystic duct.  The [facility]  cholangiogram catheter was inserted and secured with a clip.  Cholangiogram  was obtained which showed good flow proximally and distally in the biliary  tree with good flow in the duodenum and no filling defects.  The catheter  was removed and the cystic duct was ligated, clipped and divided.  Two  branches of the cystic artery were also ligated, closed and divided.  Cautery was then used to remove the gallbladder from the liver bed.  The  gallbladder  was removed through the umbilical port site.  We reinspected the  gallbladder fossa.  Hemostasis was obtained with cautery.  The right upper  quadrant was then thoroughly irrigated.  The ports were removed under direct  vision as pneumoperitoneum was released.  The  umbilical fascia was closed with a stay suture.  4-0 Monocryl was used to  close skin, subcuticular fascia.  Steri-Strips and clean dressings were  applied.  The patient was then extubated and brought to recovery in stable  condition.  All sponge, instrument and needle counts correct.      Wilmon Arms. Tsuei, M.D.  Electronically Signed     MKT/MEDQ  D:  05/07/2006  T:  05/07/2006  Job:  [id number]   cc:   Angeline Slim, M.D.

## 2011-02-23 NOTE — Discharge Summary (Signed)
[name], Helen Jones             ACCOUNT NO.:  [id number]   MEDICAL RECORD NO.:  [id number]          PATIENT TYPE:  INP   LOCATION:  9128                          [facility]   PHYSICIAN:  Phil D. Okey Dupre, M.D.     DATE OF BIRTH:  1965-11-02   DATE OF ADMISSION:  11/21/2005  DATE OF DISCHARGE:  11/24/2005                                 DISCHARGE SUMMARY   ADMISSION DIAGNOSES:  1.  Intrauterine pregnancy at 36-6/7 weeks.  2.  Oligohydramnios.  3.  Induction of labor.  4.  Hypothyroidism.   DISCHARGE DIAGNOSES:  1.  Normal spontaneous vaginal delivery.  2.  Viable female with Apgars of 7 at one minute and 9 at five minutes.  3.  Blood pressure bilateral tubal ligation.  4.  Hypothyroidism.   OPERATIONS AND PROCEDURES:  BTL on November 23, 2005.   HOSPITAL COURSE:  The patient is a 45 year old gravida 4, para 1-0-2-1, at  36-6/7 weeks' estimated gestational age with a history of hypothyroidism and  oligohydramnios with Helen AFI of 5.0.  The patient was admitted for induction  of labor.  The patient was induced with Cervidil and Pitocin.  The patient  went on to normal spontaneous vaginal delivery on November 22, 2005.  The  patient had a viable female with Apgars of 7 at one minute and 9 at five  minutes.  There was no nuchal cord.  Three-vessel cord placenta was  delivered spontaneously.  There were no lacerations.  Estimated blood loss  was 350 mL.  The patient had epidural anesthesia.  The patient did undergo  bilateral tubal ligation on November 23, 2005.  The patient tolerated the  procedure well and without complication.  Mom is A positive, rubella immune,  and GBS positive.  The patient did receive penicillin in labor for GBS  prophylaxis.  Mom is bottle feeding and had a BTL for contraception.   DISCHARGE MEDICATIONS:  1.  Ibuprofen 600 mg p.o. q.6h. p.r.n. pain.  2.  Percocet 5/325, 1 p.o. q.6h. p.r.n. pain.  3.  Synthroid 100 mcg p.o. daily.   DISCHARGE ACTIVITY:   The patient is reminded nothing in vagina including sex  or tampons for 6 weeks until the postpartum visit.   DISCHARGE DIET:  Routine.   DISCHARGE LABORATORY DATA:  Hemoglobin 10.8, hematocrit 31.3.   Discharge follow up is in 6 weeks at [facility].      Benn Moulder, M.D.    ______________________________  Javier Glazier. Okey Dupre, M.D.    MR/MEDQ  D:  11/24/2005  T:  11/24/2005  Job:  [id number]

## 2011-04-13 ENCOUNTER — Other Ambulatory Visit: Payer: Self-pay | Admitting: Family Medicine

## 2011-04-13 DIAGNOSIS — F32A Depression, unspecified: Secondary | ICD-10-CM

## 2011-04-13 DIAGNOSIS — F329 Major depressive disorder, single episode, unspecified: Secondary | ICD-10-CM

## 2011-04-13 MED ORDER — CITALOPRAM HYDROBROMIDE 40 MG PO TABS: 40.0000 mg | ORAL_TABLET | Freq: Every day | ORAL | Status: DC

## 2011-04-13 NOTE — Telephone Encounter (Signed)
Consulted with Dr. Deirdre Priest and he advises ok to refill  as requested by patient .

## 2011-04-13 NOTE — Telephone Encounter (Signed)
Would like a refill of Citalopram enough to last until her MD appt next Friday.  She is out and just needs enough to last her until then.  [facility] [address], and have already called, but was told she was out of refills and needed to make an appt.

## 2011-04-20 ENCOUNTER — Encounter: Payer: Self-pay | Admitting: Family Medicine

## 2011-04-20 ENCOUNTER — Ambulatory Visit (INDEPENDENT_AMBULATORY_CARE_PROVIDER_SITE_OTHER): Payer: Self-pay | Admitting: Family Medicine

## 2011-04-20 VITALS — BP 108/75 | HR 83 | Temp 98.6°F | Ht 63.0 in | Wt 165.0 lb

## 2011-04-20 DIAGNOSIS — E663 Overweight: Secondary | ICD-10-CM

## 2011-04-20 DIAGNOSIS — F339 Major depressive disorder, recurrent, unspecified: Secondary | ICD-10-CM

## 2011-04-20 DIAGNOSIS — L259 Unspecified contact dermatitis, unspecified cause: Secondary | ICD-10-CM

## 2011-04-20 DIAGNOSIS — L309 Dermatitis, unspecified: Secondary | ICD-10-CM | POA: Insufficient documentation

## 2011-04-20 DIAGNOSIS — F3289 Other specified depressive episodes: Secondary | ICD-10-CM

## 2011-04-20 DIAGNOSIS — F32A Depression, unspecified: Secondary | ICD-10-CM

## 2011-04-20 DIAGNOSIS — F329 Major depressive disorder, single episode, unspecified: Secondary | ICD-10-CM

## 2011-04-20 DIAGNOSIS — F17200 Nicotine dependence, unspecified, uncomplicated: Secondary | ICD-10-CM

## 2011-04-20 DIAGNOSIS — E039 Hypothyroidism, unspecified: Secondary | ICD-10-CM

## 2011-04-20 DIAGNOSIS — K219 Gastro-esophageal reflux disease without esophagitis: Secondary | ICD-10-CM

## 2011-04-20 LAB — CBC
Hemoglobin: 13.8 g/dL (ref 12.0–15.0)
MCH: 31.9 pg (ref 26.0–34.0)
MCHC: 32.9 g/dL (ref 30.0–36.0)
MCV: 97 fL (ref 78.0–100.0)

## 2011-04-20 LAB — POCT GLYCOSYLATED HEMOGLOBIN (HGB A1C): Hemoglobin A1C: 5.2

## 2011-04-20 MED ORDER — FAMOTIDINE 20 MG PO TABS: 20.0000 mg | ORAL_TABLET | Freq: Every day | ORAL | Status: DC

## 2011-04-20 MED ORDER — LEVOTHYROXINE SODIUM 100 MCG PO TABS: 100.0000 ug | ORAL_TABLET | Freq: Every day | ORAL | Status: DC

## 2011-04-20 MED ORDER — CITALOPRAM HYDROBROMIDE 40 MG PO TABS: 40.0000 mg | ORAL_TABLET | Freq: Every day | ORAL | Status: DC

## 2011-04-20 NOTE — Assessment & Plan Note (Signed)
Symptoms controlled with pepcid. Will refill today.

## 2011-04-20 NOTE — Progress Notes (Signed)
  Subjective:    Patient ID: Helen Jones, female    DOB: 09/23/1966, 45 y.o.   MRN: [id number]  HPI  1. Anxiety/depression. Patient has been taking celexa for many years. She is about to lapse on her medications and needs refills. States that she "cannot be without my stress med." Denies SI/HI or other life disruption secondary to stress. No weight changes.   2. Hypothyroid. Taking her medication daily but likewise is almost out of medication. Denies palpitations, wt change, heat/cold intolerance.   3. Dry skin in ears. Has been a problem several months. Flaky dry skin in bilateral ear canals and external ear. Also some at nape of her neck. Has hx of eczema. Could not afford the ear drops prescribed in the past. Uses perfumed shampoo.   Review of Systems See HPI otherwise all other systems negative.    Objective:   Physical Exam  Vitals reviewed. Constitutional: She is oriented to person, place, and time. She appears well-developed and well-nourished. No distress.  HENT:  Head: Normocephalic and atraumatic.  Mouth/Throat: No oropharyngeal exudate.       Bilateral ext ear canals with dry flaky hyperkeratinized skin. No weeping or bleeding. Also at hairline on base of neck-generalized hyperkeratotic/dry skin, mild erythema.  Eyes: EOM are normal. Pupils are equal, round, and reactive to light.  Cardiovascular: Normal rate, regular rhythm and normal heart sounds.   No murmur heard. Pulmonary/Chest: Effort normal and breath sounds normal. No respiratory distress.  Musculoskeletal: She exhibits no edema and no tenderness.  Neurological: She is alert and oriented to person, place, and time.  Skin: She is not diaphoretic.  Psychiatric: She has a normal mood and affect.          Assessment & Plan:

## 2011-04-20 NOTE — Assessment & Plan Note (Signed)
No symptoms of dysfunction. WIll check TSH. Continue thyroxine at currently.

## 2011-04-20 NOTE — Patient Instructions (Signed)
Nice to meet you. I have refilled your medicines. Try using vaseline or cocoa butter, Eucerin or cetaphil ointment on your dry skin. You can use hydrocortisone cream on your ears.   Eczema / Atopic Dermatitis Atopic dermatitis, or eczema, is an inherited type of sensitive skin. Often people with eczema have a family history of allergies, asthma, or hay fever. It causes a red itchy rash and dry scaly skin. The itchiness may occur before the skin rash and may be very intense. It is not contagious. Eczema is generally worse during the cooler winter months and often improves with the warmth of summer. Eczema usually starts showing signs in infancy. Some children outgrow eczema, but it may last through adulthood. Flare-ups may be caused by:  Eating something or contact with something you are sensitive or allergic to.   Stress.  DIAGNOSIS The diagnosis of eczema is usually based upon symptoms and medical history. TREATMENT Eczema cannot be cured, but symptoms usually can be controlled with treatment or avoidance of allergens (things to which you are sensitive or allergic to).  Controlling the itching and scratching.   Use over-the-counter antihistamines as directed for itching. It is especially useful at night when the itching tends to be worse.   Use over-the-counter steroid creams as directed for itching.   Scratching makes the rash and itching worse and may cause impetigo (a skin infection) if fingernails are contaminated (dirty).   Keeping the skin well moisturized with creams every day. This will seal in moisture and help prevent dryness. Lotions containing alcohol and water can dry the skin and are not recommended.   Limiting exposure to allergens.   Recognizing situations that cause stress.   Developing a plan to manage stress.  HOME CARE INSTRUCTIONS  Take prescription and over-the-counter medicines as directed by your caregiver.   Do not use anything on the skin without checking  with your caregiver.   Keep baths or showers short (5 minutes) in warm (not hot) water. Use mild cleansers for bathing. You may add non-perfumed bath oil to the bath water. It is best to avoid soap and bubble bath.   Immediately after a bath or shower, when the skin is still damp, apply a moisturizing ointment to the entire body. This ointment should be a petroleum ointment. This will seal in moisture and help prevent dryness. The thicker the ointment the better. These should be unscented.   Keep fingernails cut short and wash hands often. If your child has eczema, it may be necessary to put soft gloves or mittens on your child at night.   Dress in clothes made of cotton or cotton blends. Dress lightly, as heat increases itching.   Avoid foods that may cause flare-ups. Common foods include cow's milk, peanut butter, eggs and wheat.   Keep a child with eczema away from anyone with fever blisters. The virus that causes fever blisters (herpes simplex) can cause a serious skin infection in children with eczema.  SEEK MEDICAL CARE IF:  Itching interferes with sleep.   The rash gets worse or is not better within one week following treatment.   The rash looks infected (pus or soft yellow scabs).   You or your child has an oral temperature above 102 F (38.9 C).   Your baby is older than 3 months with a rectal temperature of 100.5 F (38.1 C) or higher for more than 1 day.   The rash flares up after contact with someone who has fever blisters.  SEEK IMMEDIATE MEDICAL CARE IF:  Your baby is older than 3 months with a rectal temperature of 102 F (38.9 C) or higher.   Your baby is older than 3 months or younger with a rectal temperature of 100.4 F (38 C) or higher.  Document Released: 09/21/2000 Document Re-Released: 12/19/2009 [facility] Patient Information 2011 [place], [place].

## 2011-04-20 NOTE — Assessment & Plan Note (Signed)
Screen with a1c and LDL.

## 2011-04-20 NOTE — Assessment & Plan Note (Signed)
Improved from 3 ppd-->1ppd. Could not tolerate chantix. Not interested in pharmacologic therapy currently.

## 2011-04-20 NOTE — Assessment & Plan Note (Signed)
Continue celexa given hx of recurrent episodes. No SI/HI currently. Patient not interested in therapy at this time.

## 2011-04-20 NOTE — Assessment & Plan Note (Signed)
Bilateral ear dryness and nape of neck most consistent with chronic eczema. Advised daily petroleum-based ointment for moisture and OTC hydrocortisone cream as cheaper alternative to steroid otic drops. PATP.

## 2011-06-20 ENCOUNTER — Telehealth: Payer: Self-pay | Admitting: Family Medicine

## 2011-06-20 NOTE — Telephone Encounter (Signed)
Pt is stating that the Celexa is making her sore joints and needs something different that 's on the $4 plan.

## 2011-06-21 NOTE — Telephone Encounter (Signed)
Please notify patient she needs to make an appointment if she needs to change medications. I'm not aware that med causes joint pain.

## 2011-06-25 NOTE — Telephone Encounter (Signed)
Spoke with patient and set her up an appointment for 9/24 @ 10:15am with Dr. Cristal Ford. Patient stated that she heard some bad new this morning and is wanting medication to hold her over. After speaking with Dr. Cristal Ford, patient is going to have to come in for that visit due to her not knowing this patient.Erma Pinto

## 2011-07-02 ENCOUNTER — Ambulatory Visit (INDEPENDENT_AMBULATORY_CARE_PROVIDER_SITE_OTHER): Payer: Self-pay | Admitting: Family Medicine

## 2011-07-02 ENCOUNTER — Encounter: Payer: Self-pay | Admitting: Family Medicine

## 2011-07-02 VITALS — BP 115/77 | HR 85 | Temp 98.1°F | Ht 63.0 in | Wt 165.2 lb

## 2011-07-02 DIAGNOSIS — L219 Seborrheic dermatitis, unspecified: Secondary | ICD-10-CM | POA: Insufficient documentation

## 2011-07-02 DIAGNOSIS — F339 Major depressive disorder, recurrent, unspecified: Secondary | ICD-10-CM

## 2011-07-02 MED ORDER — KETOCONAZOLE 2 % EX SHAM: MEDICATED_SHAMPOO | CUTANEOUS | Status: AC

## 2011-07-02 MED ORDER — FLUOXETINE HCL 20 MG PO TABS: 20.0000 mg | ORAL_TABLET | Freq: Every day | ORAL | Status: DC
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
## 2011-07-02 NOTE — Assessment & Plan Note (Signed)
Moderate exacerbation. Will start with ketoconazole shampoo 3 times weekly with zinc/selenium in between. Hydrocortisone ointment on ears for itch.

## 2011-07-02 NOTE — Patient Instructions (Addendum)
I think your symptoms are due to overwhelming stress in your life. Call [facility] for therapist or call our clinic if Helen Jones is unavailable. We can try to start a different medication called prozac. Take this medicine once every day. You may not notice an effect for several weeks, but continue taking this unless you cannot tolerate it. Make an appointment in one week for follow up. Avoid taking magnesium with your thyroid medicine.  You can use ketoconazole shampoo 2-3 times weekly, then use zinc or selenium shampoo (head&shouders) on other days.  You may use hydrocortisone cream on your ears.  Seborrheic Dermatitis Seborrheic dermatitis involves pink or red skin with greasy, flaky scales. This is often found on the scalp, eyebrows, nose, bearded area, and behind or on the ears. It can also occur on the central chest. It often occurs where there are more oil (sebaceous) glands. This condition is also known as dandruff. When this condition affects a baby's scalp, it is called cradle cap. It may come and go for no known reason. It can occur in infancy or old age. CAUSES The cause is unknown. It is not the result of too little moisture or too much oil. In some people, seborrheic dermatitis flare-ups seem to be triggered by stress. It also commonly occurs in people with certain diseases such as Parkinson's disease or HIV/AIDS. SYMPTOMS  Thick scales on the scalp.   Redness on the face or in the armpits.   The skin may seem oily or dry, but moisturizers do not help.   In infants, seborrheic dermatitis appears as scaly redness that does not seem to bother the baby. In some babies, it affects only the scalp. In others, it also affects the neck creases, armpits, groin, or behind the ears.   In adults and adolescents, seborrheic dermatitis may affect only the scalp. It may look patchy or spread out, with areas of redness and flaking. Other areas commonly affected include:    Eyebrows.  Eyelids.   Forehead.   Skin behind the ears.   Outer ears.  Chest.   Armpits.   Nose creases.   Skin creases under the breasts.  Skin between the buttocks.   Groin.    Some adults and adolescents feel itching or burning in the affected areas. Others have no problems.  DIAGNOSIS Your caregiver can usually tell what the problem is by doing a physical exam. TREATMENT  Cortisone (steroid) ointments, creams, and lotions can help decrease inflammation.   Babies can be treated with baby oil to soften the scales, and then washed with baby shampoo. If this does not help, medicated shampoos should work.   Adults can also use medicated shampoos.   Your caregiver may prescribe corticosteroid cream and shampoo containing an antifungal or yeast medicine (ketoconazole). Hydrocortisone or anti-yeast cream can be rubbed directly into seborrheic dermatitis patches. Yeast does not cause seborrheic dermatitis, but it seems to add to the problem.  In infants, seborrheic dermatitis is often worst during the first year of life. It tends to disappear on its own as the child grows. However, it may return during the teenage years. In adults and adolescents, seborrheic dermatitis tends to be a long-lasting condition that comes and goes over many years. HOME CARE INSTRUCTIONS  Use prescribed medicines as directed.   In infants, do not aggressively remove the scales or flakes on the scalp with a comb or other means. This may lead to hair loss.  SEEK MEDICAL CARE IF:  The problem does not improve from the medicated shampoos, lotions, or other medicines given by your caregiver.   You have any other questions or concerns.  Document Released: 09/24/2005 Document Re-Released: 03/14/2010 [facility] Patient Information 2011 [place], [place].

## 2011-07-02 NOTE — Assessment & Plan Note (Signed)
Exacerbated with recent increase in psychosocial stress and discontinuation of celexa. PHQ-9 score is 15. Most likely her multitude of complaints are somatoform issues related to anxiety and depression instead of adverse effects of celexa since she has been taking that medication for several years. Patient preference is to switch medication, so we will start low dose prozac and follow up in one week. Counseled regarding possibility of side effects. Most important is to restart regular therapy. Pt agrees to contact [facility] to re-establish care.

## 2011-07-13 ENCOUNTER — Ambulatory Visit: Payer: Self-pay | Admitting: Family Medicine

## 2011-07-23 ENCOUNTER — Ambulatory Visit: Payer: Self-pay | Admitting: Family Medicine

## 2011-12-01 ENCOUNTER — Encounter (HOSPITAL_COMMUNITY): Payer: Self-pay | Admitting: Emergency Medicine

## 2011-12-01 ENCOUNTER — Emergency Department (HOSPITAL_COMMUNITY)
Admission: EM | Admit: 2011-12-01 | Discharge: 2011-12-01 | Disposition: A | Discharge status: Home / Self Care | Payer: Self-pay | Attending: Emergency Medicine | Admitting: Emergency Medicine

## 2011-12-01 DIAGNOSIS — Z79899 Other long term (current) drug therapy: Secondary | ICD-10-CM | POA: Insufficient documentation

## 2011-12-01 DIAGNOSIS — E039 Hypothyroidism, unspecified: Secondary | ICD-10-CM | POA: Insufficient documentation

## 2011-12-01 DIAGNOSIS — L239 Allergic contact dermatitis, unspecified cause: Secondary | ICD-10-CM

## 2011-12-01 DIAGNOSIS — F329 Major depressive disorder, single episode, unspecified: Secondary | ICD-10-CM | POA: Insufficient documentation

## 2011-12-01 DIAGNOSIS — R221 Localized swelling, mass and lump, neck: Secondary | ICD-10-CM | POA: Insufficient documentation

## 2011-12-01 DIAGNOSIS — F3289 Other specified depressive episodes: Secondary | ICD-10-CM | POA: Insufficient documentation

## 2011-12-01 DIAGNOSIS — K219 Gastro-esophageal reflux disease without esophagitis: Secondary | ICD-10-CM | POA: Insufficient documentation

## 2011-12-01 DIAGNOSIS — R220 Localized swelling, mass and lump, head: Secondary | ICD-10-CM | POA: Insufficient documentation

## 2011-12-01 DIAGNOSIS — L259 Unspecified contact dermatitis, unspecified cause: Secondary | ICD-10-CM | POA: Insufficient documentation

## 2011-12-01 DIAGNOSIS — F17200 Nicotine dependence, unspecified, uncomplicated: Secondary | ICD-10-CM | POA: Insufficient documentation

## 2011-12-01 MED ORDER — HYDROXYZINE HCL 25 MG PO TABS
50.0000 mg | ORAL_TABLET | Freq: Once | ORAL | Status: AC
  Administered 2011-12-01: 50 mg via ORAL
  Filled 2011-12-01: qty 2

## 2011-12-01 MED ORDER — HYDROXYZINE HCL 25 MG PO TABS: 25.0000 mg | ORAL_TABLET | Freq: Four times a day (QID) | ORAL | Status: AC

## 2011-12-01 MED ORDER — PREDNISONE 20 MG PO TABS: ORAL_TABLET | ORAL | Status: AC

## 2011-12-01 MED ORDER — FAMOTIDINE 20 MG PO TABS
20.0000 mg | ORAL_TABLET | Freq: Once | ORAL | Status: AC
  Administered 2011-12-01: 20 mg via ORAL
  Filled 2011-12-01: qty 1

## 2011-12-01 MED ORDER — FAMOTIDINE 20 MG PO TABS: 20.0000 mg | ORAL_TABLET | Freq: Two times a day (BID) | ORAL | Status: DC

## 2011-12-01 MED ORDER — PREDNISONE 20 MG PO TABS
60.0000 mg | ORAL_TABLET | Freq: Once | ORAL | Status: AC
  Administered 2011-12-01: 60 mg via ORAL
  Filled 2011-12-01: qty 3

## 2011-12-01 NOTE — Discharge Instructions (Signed)
Contact Dermatitis Contact dermatitis is a reaction to certain substances that touch the skin. Contact dermatitis can be either irritant contact dermatitis or allergic contact dermatitis. Irritant contact dermatitis does not require previous exposure to the substance for a reaction to occur. Allergic contact dermatitis only occurs if you have been exposed to the substance before. Upon a repeat exposure, your body reacts to the substance.   CAUSES   Many substances can cause contact dermatitis. Irritant dermatitis is most commonly caused by repeated exposure to mildly irritating substances, such as:  Makeup.     Soaps.    Detergents.    Bleaches.    Acids.    Metal salts, such as nickel.  Allergic contact dermatitis is most commonly caused by exposure to:  Poisonous plants.     Chemicals (deodorants, shampoos).     Jewelry.    Latex.    Neomycin in triple antibiotic cream.     Preservatives in products, including clothing.  SYMPTOMS   The area of skin that is exposed may develop:  Dryness or flaking.     Redness.    Cracks.    Itching.    Pain or a burning sensation.     Blisters.  With allergic contact dermatitis, there may also be swelling in areas such as the eyelids, mouth, or genitals.   DIAGNOSIS   Your caregiver can usually tell what the problem is by doing a physical exam. In cases where the cause is uncertain and an allergic contact dermatitis is suspected, a patch skin test may be performed to help determine the cause of your dermatitis. TREATMENT Treatment includes protecting the skin from further contact with the irritating substance by avoiding that substance if possible. Barrier creams, powders, and gloves may be helpful. Your caregiver may also recommend:  Steroid creams or ointments applied 2 times daily. For best results, soak the rash area in cool water for 20 minutes. Then apply the medicine. Cover the area with a plastic wrap. You can store the  steroid cream in the refrigerator for a "chilly" effect on your rash. That may decrease itching. Oral steroid medicines may be needed in more severe cases.     Antibiotics or antibacterial ointments if a skin infection is present.     Antihistamine lotion or an antihistamine taken by mouth to ease itching.     Lubricants to keep moisture in your skin.     Burow's solution to reduce redness and soreness or to dry a weeping rash. Mix one packet or tablet of solution in 2 cups cool water. Dip a clean washcloth in the mixture, wring it out a bit, and put it on the affected area. Leave the cloth in place for 30 minutes. Do this as often as possible throughout the day.     Taking several cornstarch or baking soda baths daily if the area is too large to cover with a washcloth.  Harsh chemicals, such as alkalis or acids, can cause skin damage that is like a burn. You should flush your skin for 15 to 20 minutes with cold water after such an exposure. You should also seek immediate medical care after exposure. Bandages (dressings), antibiotics, and pain medicine may be needed for severely irritated skin.   HOME CARE INSTRUCTIONS  Avoid the substance that caused your reaction.     Keep the area of skin that is affected away from hot water, soap, sunlight, chemicals, acidic substances, or anything else that would irritate your skin.       Do not scratch the rash. Scratching may cause the rash to become infected.     You may take cool baths to help stop the itching.     Only take over-the-counter or prescription medicines as directed by your caregiver.     See your caregiver for follow-up care as directed to make sure your skin is healing properly.  SEEK MEDICAL CARE IF:    Your condition is not better after 3 days of treatment.     You seem to be getting worse.     You see signs of infection such as swelling, tenderness, redness, soreness, or warmth in the affected area.     You have any problems  related to your medicines.  Document Released: 09/21/2000 Document Revised: 06/06/2011 Document Reviewed: 02/27/2011 [employer] Patient Information 2012 [employer]. 

## 2011-12-01 NOTE — ED Notes (Signed)
Pt. Stated, I dyed my hair 2 weeks ago and then i started putting DAWN on it to lighten it and i started breaking out around my hair and my neck. Starts with ears and goes all the around to the crown of my head

## 2011-12-01 NOTE — ED Provider Notes (Signed)
History     CSN: [id number]  Arrival date & time 12/01/11  1349   First MD Initiated Contact with Patient 12/01/11 1432      Chief Complaint  Patient presents with  . Allergic Reaction    (Consider location/radiation/quality/duration/timing/severity/associated sxs/prior treatment) Patient is a 46 y.o. female presenting with allergic reaction. The history is provided by the patient. No language interpreter was used.  Allergic Reaction The primary symptoms are  rash. The primary symptoms do not include shortness of breath, cough, abdominal pain, nausea, vomiting, dizziness, palpitations or angioedema. The current episode started more than 2 days ago (1 week ago). The problem has been gradually worsening. This is a new problem.  The rash began more than 1 week ago. The rash appears on the scalp and neck. The pain associated with the rash is moderate. The rash is associated with blisters, itching and weeping.  Associated with: soap and hair dye. Significant symptoms also include itching. Significant symptoms that are not present include rhinorrhea.    Past Medical History  Diagnosis Date  . Depression   . GERD (gastroesophageal reflux disease)   . Hypothyroid   . Eczema   . Anxiety     Past Surgical History  Procedure Date  . Tubal ligation   . Cholecystectomy     No family history on file.  History  Substance Use Topics  . Smoking status: Current Everyday Smoker -- 1.0 packs/day  . Smokeless tobacco: Not on file  . Alcohol Use: No    OB History    Grav Para Term Preterm Abortions TAB SAB Ect Mult Living                  Review of Systems  Constitutional: Negative for fever, chills, activity change, appetite change and fatigue.  HENT: Positive for facial swelling. Negative for congestion, sore throat, rhinorrhea, neck pain and neck stiffness.   Respiratory: Negative for cough and shortness of breath.   Cardiovascular: Negative for chest pain and palpitations.    Gastrointestinal: Negative for nausea, vomiting and abdominal pain.  Genitourinary: Negative for dysuria, urgency, frequency and flank pain.  Musculoskeletal: Negative for myalgias, back pain and arthralgias.  Skin: Positive for itching, rash and wound.  Neurological: Negative for dizziness, weakness, light-headedness, numbness and headaches.  All other systems reviewed and are negative.    Allergies  Review of patient's allergies indicates no known allergies.  Home Medications   Current Outpatient Rx  Name Route Sig Dispense Refill  . ASPIRIN-SALICYLAMIDE-CAFFEINE 650-195-33.3 MG PO PACK Oral Take 6-10 packets by mouth daily.      Marland Kitchen DIPHENHYDRAMINE HCL 25 MG PO TABS Oral Take 25 mg by mouth every 6 (six) hours as needed. For allergies    . FAMOTIDINE 20 MG PO TABS Oral Take 20 mg by mouth daily.    Marland Kitchen LEVOTHYROXINE SODIUM 100 MCG PO TABS Oral Take 100 mcg by mouth daily.    Marland Kitchen FISH OIL 1200 MG PO CAPS Oral Take 1,200 mg by mouth 2 (two) times daily.    Marland Kitchen PYRIDOXINE HCL 100 MG PO TABS Oral Take 100 mg by mouth daily.      Marland Kitchen FAMOTIDINE 20 MG PO TABS Oral Take 1 tablet (20 mg total) by mouth 2 (two) times daily. 30 tablet 0  . HYDROXYZINE HCL 25 MG PO TABS Oral Take 1 tablet (25 mg total) by mouth every 6 (six) hours. 30 tablet 0  . PREDNISONE 20 MG PO TABS  3 Tabs PO  Days 1-3, then 2 tabs PO Days 4-6, then 1 tab PO Day 7-9, then Half Tab PO Day 10-12 20 tablet 0    BP 115/73  Pulse 85  Temp(Src) 98.4 F (36.9 C) (Oral)  Resp 20  SpO2 100%  Physical Exam  Nursing note and vitals reviewed. Constitutional: She is oriented to person, place, and time. She appears well-developed and well-nourished. No distress.  HENT:  Head: Normocephalic and atraumatic.  Mouth/Throat: Oropharynx is clear and moist. No oropharyngeal exudate.  Eyes: Conjunctivae and EOM are normal. Pupils are equal, round, and reactive to light.  Neck: Normal range of motion.  Cardiovascular: Normal rate, regular  rhythm, normal heart sounds and intact distal pulses.  Exam reveals no gallop and no friction rub.   No murmur heard. Pulmonary/Chest: Effort normal and breath sounds normal. No respiratory distress. She has no wheezes.  Abdominal: Soft. Bowel sounds are normal. There is no tenderness.  Musculoskeletal: Normal range of motion. She exhibits no tenderness.  Neurological: She is alert and oriented to person, place, and time. No cranial nerve deficit.  Skin: Skin is warm and dry. Rash noted.       Erythematous macular papular rash to the posterior neck, occiput, postauricular areas bilaterally. There is some associated swelling and weeping. No evidence of infection    ED Course  Procedures (including critical care time)  Labs Reviewed - No data to display No results found.   1. Allergic dermatitis       MDM  Allergic dermatitis secondary to hair dye and dawn dish soap. Instructed to refrain from any substances. Will be placed on a prolonged prednisone taper, Vistaril for her right wrist, Pepcid. Instructed to followup with her primary care physician in one week for reassessment.        Dayton Bailiff, MD 12/01/11 1515

## 2011-12-13 ENCOUNTER — Emergency Department (HOSPITAL_COMMUNITY)
Admission: EM | Admit: 2011-12-13 | Discharge: 2011-12-13 | Disposition: A | Discharge status: Home / Self Care | Payer: Self-pay | Attending: Emergency Medicine | Admitting: Emergency Medicine

## 2011-12-13 ENCOUNTER — Encounter (HOSPITAL_COMMUNITY): Payer: Self-pay

## 2011-12-13 DIAGNOSIS — F341 Dysthymic disorder: Secondary | ICD-10-CM | POA: Insufficient documentation

## 2011-12-13 DIAGNOSIS — Z79899 Other long term (current) drug therapy: Secondary | ICD-10-CM | POA: Insufficient documentation

## 2011-12-13 DIAGNOSIS — E039 Hypothyroidism, unspecified: Secondary | ICD-10-CM | POA: Insufficient documentation

## 2011-12-13 DIAGNOSIS — L0291 Cutaneous abscess, unspecified: Secondary | ICD-10-CM

## 2011-12-13 DIAGNOSIS — L408 Other psoriasis: Secondary | ICD-10-CM

## 2011-12-13 DIAGNOSIS — J701 Chronic and other pulmonary manifestations due to radiation: Secondary | ICD-10-CM | POA: Insufficient documentation

## 2011-12-13 DIAGNOSIS — L02219 Cutaneous abscess of trunk, unspecified: Secondary | ICD-10-CM | POA: Insufficient documentation

## 2011-12-13 MED ORDER — LIDOCAINE HCL 2 % IJ SOLN
10.0000 mL | Freq: Once | INTRAMUSCULAR | Status: AC
  Administered 2011-12-13: 200 mg via INTRADERMAL

## 2011-12-13 MED ORDER — OXYCODONE-ACETAMINOPHEN 5-325 MG PO TABS
2.0000 | ORAL_TABLET | Freq: Once | ORAL | Status: AC
  Administered 2011-12-13: 2 via ORAL
  Filled 2011-12-13: qty 2

## 2011-12-13 MED ORDER — SULFAMETHOXAZOLE-TRIMETHOPRIM 800-160 MG PO TABS: 1.0000 | ORAL_TABLET | Freq: Two times a day (BID) | ORAL | Status: AC

## 2011-12-13 MED ORDER — OXYCODONE-ACETAMINOPHEN 5-325 MG PO TABS: ORAL_TABLET | ORAL | Status: AC

## 2011-12-13 NOTE — ED Notes (Signed)
Pt presents with onset of multiple abscesses, skin lesions after being placed on medication on 2/24.  Pt was seen then for possible reaction to hair dye.  Pt reports after beginning medication, multiple lesions noted to generalized areas.

## 2011-12-13 NOTE — ED Notes (Signed)
PA at the bedside to I&D

## 2011-12-13 NOTE — ED Provider Notes (Signed)
History     CSN: [id number]  Arrival date & time 12/13/11  0940   None     Chief Complaint  Patient presents with  . Abscess    (Consider location/radiation/quality/duration/timing/severity/associated sxs/prior treatment) Patient is a 46 y.o. female presenting with abscess.  Abscess    Patient presents to ER complaining of a few day hx of gradual onset skin infections across her back stating that she was seen in ER on 2/24 after having an allergic reaction on her scalp after washing her hair with dawn detergent and was prescribed prednisone, atatax, and pepcid and states that a few days after starting medications she noticed pustules on her back. Patient states she takes no medications on a regular basis. Symptoms were gradual onset and associated with "some white pus drainage from some of the popped bumps." she denies associated fevers, chills, nausea, vomiting or abdominal pain. Patient states she has been taking OTC pain meds for discomfort with lesions with some relief. Patient states she has hx of MRSA skin infection in the past.   Past Medical History  Diagnosis Date  . Depression   . GERD (gastroesophageal reflux disease)   . Hypothyroid   . Eczema   . Anxiety     Past Surgical History  Procedure Date  . Tubal ligation   . Cholecystectomy     No family history on file.  History  Substance Use Topics  . Smoking status: Current Everyday Smoker -- 1.0 packs/day  . Smokeless tobacco: Not on file  . Alcohol Use: No    OB History    Grav Para Term Preterm Abortions TAB SAB Ect Mult Living                  Review of Systems  All other systems reviewed and are negative.    Allergies  Review of patient's allergies indicates no known allergies.  Home Medications   Current Outpatient Rx  Name Route Sig Dispense Refill  . ASPIRIN-SALICYLAMIDE-CAFFEINE 650-195-33.3 MG PO PACK Oral Take 6-10 packets by mouth daily.      Marland Kitchen DIPHENHYDRAMINE HCL 25 MG PO TABS Oral  Take 25 mg by mouth every 6 (six) hours as needed. For allergies    . FAMOTIDINE 20 MG PO TABS Oral Take 20 mg by mouth 2 (two) times daily.     Marland Kitchen HYDROXYZINE HCL 25 MG PO TABS Oral Take 25 mg by mouth every 6 (six) hours as needed.    Marland Kitchen LEVOTHYROXINE SODIUM 100 MCG PO TABS Oral Take 100 mcg by mouth daily.    Marland Kitchen FISH OIL 1200 MG PO CAPS Oral Take 1,200 mg by mouth 2 (two) times daily.    Marland Kitchen PREDNISONE 20 MG PO TABS Oral Take 20 mg by mouth daily. Take 3 tablets for 3 days, then take 2 tablets for 3 days, then take 1 tablet for 3 days and then take 1/2 a tablet for 3 days.    Marland Kitchen PYRIDOXINE HCL 100 MG PO TABS Oral Take 100 mg by mouth daily.      . OXYCODONE-ACETAMINOPHEN 5-325 MG PO TABS  Take 1-2 tabs every 4 hours as needed for pain. 15 tablet 0  . SULFAMETHOXAZOLE-TRIMETHOPRIM 800-160 MG PO TABS Oral Take 1 tablet by mouth 2 (two) times daily. 14 tablet 0    BP 112/65  Pulse 96  Temp(Src) 99.4 F (37.4 C) (Oral)  Resp 20  SpO2 98%  Physical Exam  Nursing note and vitals reviewed. Constitutional: She is oriented  to person, place, and time. She appears well-developed and well-nourished. No distress.  HENT:  Head: Normocephalic and atraumatic.  Eyes: Conjunctivae are normal.  Neck: Normal range of motion. Neck supple.  Cardiovascular: Normal rate, regular rhythm, normal heart sounds and intact distal pulses.  Exam reveals no gallop and no friction rub.   No murmur heard. Pulmonary/Chest: Effort normal and breath sounds normal. No respiratory distress. She has no wheezes. She has no rales. She exhibits no tenderness.  Abdominal: Soft. Bowel sounds are normal. She exhibits no distension and no mass. There is no tenderness. There is no rebound and no guarding.  Musculoskeletal: Normal range of motion. She exhibits no edema and no tenderness.  Neurological: She is alert and oriented to person, place, and time.  Skin: Skin is warm and dry. No rash noted. She is not diaphoretic. There is  erythema.       Multiple scattered pustules along posterior back with mild TTP and an area of faint erythema of right lower back with moderate TTP and 3x3 area of induration. No crepitous.   Psychiatric: She has a normal mood and affect.    ED Course  Procedures (including critical care time)  PO percocet  INCISION AND DRAINAGE Performed by: Jenness Corner Consent: Verbal consent obtained. Risks and benefits: risks, benefits and alternatives were discussed Type: abscess  Body area: right lower back  Anesthesia: local infiltration  Local anesthetic: lidocaine 2% with epinephrine  Anesthetic total: 10 ml  Complexity: complex Blunt dissection to break up loculations  Drainage: purulent  Drainage amount: small amount of blood mixed purulent drainage  Packing material: none  Patient tolerance: Patient tolerated the procedure well with no immediate complications.     Labs Reviewed - No data to display No results found.   1. Abscess of skin   2. Psoriasis with pustules       MDM  Numerous pustules and one abscess that was drained without complication but no signs of cellulitis. denies fevers, chills, n/v. Given the diffuse area of pustules and patient stating hx of MRSA, she is agreeable to following up with a dermatologist for further management but to return to ER for changing or worsening of symptoms.         Jenness Corner, [place] 12/13/11 1157

## 2011-12-13 NOTE — ED Provider Notes (Signed)
Medical screening examination/treatment/procedure(s) were performed by non-physician practitioner and as supervising physician I was immediately available for consultation/collaboration.   Gerhard Munch, MD 12/13/11 [phone]

## 2011-12-13 NOTE — Discharge Instructions (Signed)
Apply warm compresses to areas of redness throughout the day for 15 minutes at a time. Take antibiotic and completion. Take percocet as directed, as needed for pain but do not drive or operate machinery with pain medication use. Alternate with ibuprofen use for additional relief. Follow up with Dermatology in the next week for recheck of skin infection as well as recheck of inflamed scalp. However, return to emergency department for emergent changing or worsening symptoms to include but no limited to increasing redness, drainage, fevers.  Abscess An abscess (boil or furuncle) is an infected area that contains a collection of pus.  SYMPTOMS Signs and symptoms of an abscess include pain, tenderness, redness, or hardness. You may feel a moveable soft area under your skin. An abscess can occur anywhere in the body.  TREATMENT  A surgical cut (incision) may be made over your abscess to drain the pus. Gauze may be packed into the space or a drain may be looped through the abscess cavity (pocket). This provides a drain that will allow the cavity to heal from the inside outwards. The abscess may be painful for a few days, but should feel much better if it was drained.  Your abscess, if seen early, may not have localized and may not have been drained. If not, another appointment may be required if it does not get better on its own or with medications. HOME CARE INSTRUCTIONS   Only take over-the-counter or prescription medicines for pain, discomfort, or fever as directed by your caregiver.   Take your antibiotics as directed if they were prescribed. Finish them even if you start to feel better.   Keep the skin and clothes clean around your abscess.   If the abscess was drained, you will need to use gauze dressing to collect any draining pus. Dressings will typically need to be changed 3 or more times a day.   The infection may spread by skin contact with others. Avoid skin contact as much as possible.    Practice good hygiene. This includes regular hand washing, cover any draining skin lesions, and do not share personal care items.   If you participate in sports, do not share athletic equipment, towels, whirlpools, or personal care items. Shower after every practice or tournament.   If a draining area cannot be adequately covered:   Do not participate in sports.   Children should not participate in day care until the wound has healed or drainage stops.   If your caregiver has given you a follow-up appointment, it is very important to keep that appointment. Not keeping the appointment could result in a much worse infection, chronic or permanent injury, pain, and disability. If there is any problem keeping the appointment, you must call back to this facility for assistance.  SEEK MEDICAL CARE IF:   You develop increased pain, swelling, redness, drainage, or bleeding in the wound site.   You develop signs of generalized infection including muscle aches, chills, fever, or a general ill feeling.   You have an oral temperature above 102 F (38.9 C).  MAKE SURE YOU:   Understand these instructions.   Will watch your condition.   Will get help right away if you are not doing well or get worse.  Document Released: 07/04/2005 Document Revised: 09/13/2011 Document Reviewed: 04/27/2008 [facility] Patient Information 2012 [place], [place].

## 2011-12-25 ENCOUNTER — Encounter: Payer: Self-pay | Admitting: Family Medicine

## 2011-12-25 ENCOUNTER — Ambulatory Visit (INDEPENDENT_AMBULATORY_CARE_PROVIDER_SITE_OTHER): Payer: Self-pay | Admitting: Family Medicine

## 2011-12-25 VITALS — BP 122/88 | HR 81 | Temp 98.7°F | Ht 63.0 in | Wt 157.0 lb

## 2011-12-25 DIAGNOSIS — A4902 Methicillin resistant Staphylococcus aureus infection, unspecified site: Secondary | ICD-10-CM

## 2011-12-25 DIAGNOSIS — L259 Unspecified contact dermatitis, unspecified cause: Secondary | ICD-10-CM

## 2011-12-25 DIAGNOSIS — L309 Dermatitis, unspecified: Secondary | ICD-10-CM

## 2011-12-25 NOTE — Assessment & Plan Note (Addendum)
Pt presents for follow-up of skin infection on day 4 of 10 day course of oral doxycycline initiated by [facility] ED.  Nodules seem to be improving as there is minimal tenderness and drainage.  Pt was instructed to finish antibiotic.  She will follow-up with her PCP, Dr. Cristal Ford, on April 1st to reassess symptoms.  Pt was given a note stating that she may return to work.  Instructed patient to keep clean and dry dressings on her wounds until they heal completely.  Patient reports improvement in area of soft tissue infection since starting on doxycycline.  Still with some fluctuant areas that appear ready to drain spontaneously.  No worrisome signs for systemic infection.  To complete 10-day course of oral doxy, may consider I&D if fail to drain spontaneously.  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 Note given to her for return to work effective immediately.  JB

## 2011-12-25 NOTE — Assessment & Plan Note (Addendum)
Pt was given clobetasol propionate cream for affected areas (mainly scalp and face) by [facility] last week.  Pt reports improvement since application of cream.  Pt will follow-up with [facility] in one week.  Contacted Nelva Bush, Child psychotherapist, to possibly arrange dermatology appt in [place] as a direct referral may not be useful since patient has no insurance.  Reviewed note and agree with above. Patient lacks insurance coverage; [employer] will not make it more likely for her to see Dermatology in [place].  Will route to CSW for additional consideration of additional resources.  Patient states she believes she would be eligible for Medicaid from financial standpoint. JB

## 2011-12-25 NOTE — Progress Notes (Signed)
  Subjective:    Patient ID: Helen Jones, female    DOB: 1966/07/11, 46 y.o.   MRN: [id number]  HPI Helen Jones is a 46 yo F with PMHx of eczema and GERD who presents for evaluation of MRSA and eczema.  For her MRSA, pt was started on a 10 day course of oral doxycycline on 3/16 in the ED.  Pt reports that abscesses are improving since starting the antibiotics.  She denies pain, numbness, or tingling, but reports pruritis around the areas.  She reports that the lesions are not draining or bleeding.  Denies f/n/v/d.    Pt has a history of eczema.  Current exacerbation was provoked by hair dye and other new products.  She reports that she saw [facility] last week and was given clobetasol propionate cream for affected areas.  She reports that her scalp is improving- denies discharge or bleeding.  Reports pruritis.   She reports that she is not allowed to return to work without a note from her doctor.   Patient seen and examined by me, accompanied by medical student Gerlene Fee MS3 [facility].  Patient seen as acute visit for follow up of recent MRSA skin infection over L lower back, as well as atopic dermatitis/eczema on scalp and R earlobe.  States that she has been improving since starting doxycycline from ED; was seen by dermatologist at [facility] and has been noticing improvement in scalp/earlobe pruritus and flaking since using it.  ROS: Denies fevers or chills; does have some pruritus over the area of soft tissue infection.    Review of Systems  All other systems reviewed and are negative.      Objective:   Physical Exam  Constitutional: She appears well-developed and well-nourished.  HENT:  Right Ear: There is tenderness.       Right external ear is very erythematous and tender to touch. Scalp is erythematous and scaly with no crusting, non-bloody, no drainage.  Neck: Normal range of motion.  Cardiovascular: Normal rate, regular rhythm and normal heart sounds.  Exam reveals no gallop  and no friction rub.   No murmur heard. Pulmonary/Chest: Effort normal and breath sounds normal. No respiratory distress. She has no wheezes. She has no rales.  Skin: Skin is warm and dry.       Pt has several non-tender, non-bloody suppurative nodules most concentrated on left lower back and flank and right shoulder and chest.  Lesions blanch.   Blood pressure 122/88, pulse 81, temperature 98.7 F (37.1 C), temperature source Oral, height 5\' 3"  (1.6 m), weight 157 lb (71.215 kg).      Assessment & Plan:

## 2011-12-25 NOTE — Patient Instructions (Signed)
Thank you for visiting [place] today.   Continue taking all of your antibiotics until they are gone. Please attend your appointment with Dr. Cristal Ford in April.

## 2012-01-07 ENCOUNTER — Ambulatory Visit (INDEPENDENT_AMBULATORY_CARE_PROVIDER_SITE_OTHER): Payer: Self-pay | Admitting: Family Medicine

## 2012-01-07 ENCOUNTER — Encounter: Payer: Self-pay | Admitting: Family Medicine

## 2012-01-07 VITALS — BP 121/77 | HR 90 | Temp 98.5°F | Ht 63.0 in | Wt 157.8 lb

## 2012-01-07 DIAGNOSIS — A4902 Methicillin resistant Staphylococcus aureus infection, unspecified site: Secondary | ICD-10-CM

## 2012-01-07 DIAGNOSIS — L309 Dermatitis, unspecified: Secondary | ICD-10-CM

## 2012-01-07 DIAGNOSIS — L259 Unspecified contact dermatitis, unspecified cause: Secondary | ICD-10-CM

## 2012-01-07 MED ORDER — FLUOCINOLONE ACETONIDE 0.01 % EX OIL: TOPICAL_OIL | CUTANEOUS | Status: DC

## 2012-01-07 NOTE — Patient Instructions (Signed)
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 Looks like infection has cleared, now just the reaction to allergies is left.  You may use clobetasol ointment on the itchy areas on legs/back. Monitor for signs of infection such as swelling, pain, oozing and go to doctor if this returns. You can start using steroid oil on scalp for itching. Make an appointment in 1-2 weeks for check up.  Community-Associated MRSA CA-MRSA stands for community-associated methicillin-resistant Staphylococcus aureus. MRSA is a type of bacteria that is resistant to some common antibiotics. It can cause infections in the skin and many other places in the body. Staphylococcus aureus, often called "staph," is a bacteria that normally lives on the skin or in the nose. Staph on the surface of the skin or in the nose does not cause problems. However, if the staph enters the body through a cut, wound, or break in the skin, an infection can happen. Up until recently, infections with the MRSA type of staph mainly occurred in hospitals and other healthcare settings. There are now increasing problems with MRSA infections in the community as well. Infections with MRSA may be very serious or even life-threatening. CA-MRSA is becoming more common. It is known to spread in crowded settings, in jails and prisons, and in situations where there is close skin-to-skin contact, such as during sporting events or in locker rooms. MRSA can be spread through shared items, such as children's toys, razors, towels, or sports equipment.  CAUSES All staph, including MRSA, are normally harmless unless they enter the body through a scratch, cut, or wound, such as with surgery. All staph, including MRSA, can be spread from person-to-person by touching contaminated objects or through direct contact.  MRSA now causes illness in people who have not been in hospitals or other healthcare facilities. Cases of MRSA diseases in the community have been associated with:     Recent antibiotic use.   Sharing contaminated towels or clothes.   Having active skin diseases.   Participating in contact sports.   Living in crowded settings.   Intravenous (IV) drug use.   Community-associated MRSA infections are usually skin infections, but may cause other severe illnesses.   Staph bacteria are one of the most common causes of skin infection. However, they are also a common cause of pneumonia, bone or joint infections, and bloodstream infections.  DIAGNOSIS Diagnosis of MRSA is done by cultures of fluid samples that may come from:  Swabs taken from cuts or wounds in infected areas.   Nasal swabs.   Saliva or deep cough specimens from the lungs (sputum).   Urine.   Blood.  Many people are "colonized" with MRSA but have no signs of infection. This means that people carry the MRSA germ on their skin or in their nose and may never develop MRSA infection.  TREATMENT  Treatment varies and is based on how serious, how deep, or how extensive the infection is. For example:  Some skin infections, such as a small boil or abscess, may be treated by draining yellowish-white fluid (pus) from the site of the infection.   Deeper or more widespread soft tissue infections are usually treated with surgery to drain pus and with antibiotic medicine given by vein or by mouth. This may be recommended even if you are pregnant.   Serious infections may require a hospital stay.  If antibiotics are given, they may be needed for several weeks. PREVENTION Because many people are colonized with staph, including MRSA, preventing the spread of the bacteria  from person-to-person is most important. The best way to prevent the spread of bacteria and other germs is through proper hand washing or by using alcohol-based hand disinfectants. The following are other ways to help prevent MRSA infection within community settings.   Wash your hands frequently with soap and water for at least 15  seconds. Otherwise, use alcohol-based hand disinfectants when soap and water is not available.   Make sure people who live with you wash their hands often, too.   Do not share personal items. For example, avoid sharing razors and other personal hygiene items, towels, clothing, and athletic equipment.   Wash and dry your clothes and bedding at the warmest temperatures recommended on the labels.   Keep wounds covered. Pus from infected sores may contain MRSA and other bacteria. Keep cuts and abrasions clean and covered with germ-free (sterile), dry bandages until they are healed.   If you have a wound that appears infected, ask your caregiver if a culture for MRSA and other bacteria should be done.   If you are breastfeeding, talk to your caregiver about MRSA. You may be asked to temporarily stop breastfeeding.  HOME CARE INSTRUCTIONS   Take your antibiotics as directed. Finish them even if you start to feel better.   Avoid close contact with those around you as much as possible. Do not use towels, razors, toothbrushes, bedding, or other items that will be used by others.   To fight the infection, follow your caregiver's instructions for wound care. Wash your hands before and after changing your bandages.   If you have an intravascular device, such as a catheter, make sure you know how to care for it.   Be sure to tell any healthcare providers that you have MRSA so they are aware of your infection.  SEEK IMMEDIATE MEDICAL CARE IF:  The infection appears to be getting worse. Signs include:   Increased warmth, redness, or tenderness around the wound site.   A red line that extends from the infection site.   A dark color in the area around the infection.   Wound drainage that is tan, yellow, or green.   A bad smell coming from the wound.   You feel sick to your stomach (nauseous) and throw up (vomit) or cannot keep medicine down.   You have a fever.   Your baby is older than 3  months with a rectal temperature of 102 F (38.9 C) or higher.   Your baby is 15 months old or younger with a rectal temperature of 100.4 F (38 C) or higher.   You have difficulty breathing.  MAKE SURE YOU:   Understand these instructions.   Will watch your condition.   Will get help right away if you are not doing well or get worse.  Document Released: 12/28/2005 Document Revised: 09/13/2011 Document Reviewed: 12/28/2010 [facility] Patient Information 2012 [place], [place].

## 2012-01-08 ENCOUNTER — Telehealth: Payer: Self-pay | Admitting: Clinical

## 2012-01-08 NOTE — Telephone Encounter (Signed)
Clinical Child psychotherapist (CSW) received referral to assist pt in applying for Medicaid. CSW unable to reach pt on the phone numbers listed on her face sheet. CSW will try again tomorrow.  Theresia Bough, MSW, Theresia Majors [phone]

## 2012-01-08 NOTE — Telephone Encounter (Signed)
Message copied by Theresia Bough on Tue Jan 08, 2012  3:02 PM ------      Message from: Durwin Reges      Created: Tue Jan 08, 2012 12:52 PM      Regarding: ?medicaid       This patient is uninsured and may need help applying for medicaid if eligible (she isn't certain and neither am I). She has been told she would be eligible if she didn't live with her boyfriend.             Ultimate need is for dermatology referral which wouldn't be feasible with orange card or without insurance.             If you have any advice, I could also pass that along to her. Thanks so much!

## 2012-01-08 NOTE — Assessment & Plan Note (Addendum)
It appears as though infection has cleared-residual skin changes are atopic/psoriatic. No further signs of infection as the abscess, pustules, pain have resolved with doxycycline x 10 days. Have discussed hand washing with antibacterial soap, protection of skin. Patient to monitor for increased pain, swelling, fever and f/u if symptoms return.

## 2012-01-08 NOTE — Telephone Encounter (Signed)
Clinical Child psychotherapist (CSW) spoke with pt regarding applying for Medicaid. Pt states she previously had Medicaid however now does not because her daughter is now on dtr father insurance and was told she would not qualify for Medicaid. CSW informed pt that she was most likely receiving Medicaid through her daughter and most likely will not qualify now unless she has a disability. Pt was understanding and appreciative of CSW information. CSW provided pt with contact numbers to the [facility] of [employer].gov to explore low cost insurance agencies. No further CSW needs addressed.  Theresia Bough, MSW, Theresia Majors [phone]

## 2012-01-08 NOTE — Progress Notes (Signed)
  Subjective:    Patient ID: Helen Jones, female    DOB: November 24, 1965, 46 y.o.   MRN: [id number]  HPI  1. MRSA boil/folliculitis. Evaluated by [facility], Dermatology at [facility] and at [facility] 2 weeks ago.  Boil on left back was incised/drained in ED on 3/7. Has now finished a prolonged course of doxycycline one week ago. Patient's pain and oozing/weeping have resolved. No new lesions. Denies tenderness, fever, malaise, fatigue, nausea.   2. Allergic dermatitis/eczema. Initially presented with severely pruritic, thickened crusty skin on scalp, face, torso which have improved from previously with steroid ointment. Believed to be allergic reaction to hair dye and dawn soap she was using to shampoo her hair when seen at [facility]. Also has several scattered plaques on back and arms. [facility] prescribed clobetasol ointment that cleared up several patches including on her face and ears. Most bothersome now are some similar patches on anterior thigh and low back surrounding the area of previous folliculitis. Severely itchy. Denies pain, oozing, weeping, bleeding. Patient cannot afford the $80 copay to return to [facility] derm clinic for follow up.   Review of Systems See HPI otherwise negative. Lives with boyfriend in [place] currently. Smoking cigarettes, but has cut down.     Objective:   Physical Exam  Vitals reviewed. Constitutional: She is oriented to person, place, and time. She appears well-developed and well-nourished. No distress.  HENT:  Right Ear: External ear normal.  Left Ear: External ear normal.  Mouth/Throat: Oropharynx is clear and moist.       Scaly, dry thickened patches surrounding hairline bilateral temporal areas and scattered on scalp. Patient observed to scratch areas and pick at them.   Neck: Neck supple.  Cardiovascular: Normal rate and regular rhythm.   Pulmonary/Chest: Effort normal.  Musculoskeletal: She exhibits no edema.  Neurological: She is alert and oriented to person,  place, and time. Coordination normal.  Skin:       Left lumbar back has large thickened erythematous scaly plaque ~10x10 cm. Mild crust noted. No pustules or vesicles. Healing incision site, no underlying abscess palpated. Tiny area of non-tender scar tissue adjacent to incision site. Excoriations noted.  Bilateral anterior thighs with small erythematous maculopapular lesions, coalescing to form larger plaque. Excoriations. Nontender, no pustules.   Psychiatric: She has a normal mood and affect.      Assessment & Plan:

## 2012-01-08 NOTE — Assessment & Plan Note (Signed)
Severe itching with plaque formation. This may be caused by allergic dermatitis vs severe atopy vs psoriasis. Since this has resolved in some areas with clobetasol, have advised patient to use on her back and thighs which are causing the greatest pruritis currently. For scalp, will try topical fluocinolone oil qhs. Will contact SW to assess if patient has medicaid eligibility, as dermatology f/u may not be financially feasible currently. Follow up in one week at [facility].

## 2012-01-22 ENCOUNTER — Ambulatory Visit (INDEPENDENT_AMBULATORY_CARE_PROVIDER_SITE_OTHER): Payer: Self-pay | Admitting: Family Medicine

## 2012-01-22 DIAGNOSIS — L309 Dermatitis, unspecified: Secondary | ICD-10-CM

## 2012-01-22 DIAGNOSIS — B86 Scabies: Secondary | ICD-10-CM

## 2012-01-22 DIAGNOSIS — L259 Unspecified contact dermatitis, unspecified cause: Secondary | ICD-10-CM

## 2012-01-22 MED ORDER — FLUOCINOLONE ACETONIDE 0.01 % EX OIL: TOPICAL_OIL | CUTANEOUS | Status: DC

## 2012-01-22 MED ORDER — HYDROXYZINE HCL 25 MG PO TABS: 25.0000 mg | ORAL_TABLET | Freq: Four times a day (QID) | ORAL | Status: DC | PRN

## 2012-01-22 MED ORDER — PERMETHRIN 5 % EX CREA: TOPICAL_CREAM | Freq: Once | CUTANEOUS | Status: AC

## 2012-01-22 NOTE — Patient Instructions (Signed)
I think your skin is dry and reactive due to chlorine in water. Stop using the extra shock treatments. I think you have scabies also. Maybe from hotel or other contact. Use permethrin ointment on body, leave on 8 hours Decontaminate house linens after washing. Use hot water. Make an appointment for follow-up if not improved in few days.  Scabies Scabies are small bugs (mites) that burrow under the skin and cause red bumps and severe itching. These bugs can only be seen with a microscope. Scabies are highly contagious. They can spread easily from person to person by direct contact. They are also spread through sharing clothing or linens that have the scabies mites living in them. It is not unusual for an entire family to become infected through shared towels, clothing, or bedding.  HOME CARE INSTRUCTIONS   Your caregiver may prescribe a cream or lotion to kill the mites. If this cream is prescribed; massage the cream into the entire area of the body from the neck to the bottom of both feet. Also massage the cream into the scalp and face if your child is less than 9 year old. Avoid the eyes and mouth.   Leave the cream on for 8 to12 hours. Do not wash your hands after application. Your child should bathe or shower after the 8 to 12 hour application period. Sometimes it is helpful to apply the cream to your child at right before bedtime.   One treatment is usually effective and will eliminate approximately 95% of infestations. For severe cases, your caregiver may decide to repeat the treatment in 1 week. Everyone in your household should be treated with one application of the cream.   New rashes or burrows should not appear after successful treatment within 24 to 48 hours; however the itching and rash may last for 2 to 4 weeks after successful treatment. If your symptoms persist longer than this, see your caregiver.   Your caregiver also may prescribe a medication to help with the itching or to help  the rash go away more quickly.   Scabies can live on clothing or linens for up to 3 days. Your entire child's recently used clothing, towels, stuffed toys, and bed linens should be washed in hot water and then dried in a dryer for at least 20 minutes on high heat. Items that cannot be washed should be enclosed in a plastic bag for at least 3 days.   To help relieve itching, bathe your child in a cool bath or apply cool washcloths to the affected areas.   Your child may return to school after treatment with the prescribed cream.  SEEK MEDICAL CARE IF:   The itching persists longer than 4 weeks after treatment.   The rash spreads or becomes infected (the area has red blisters or yellow-tan crust).  Document Released: 09/24/2005 Document Revised: 09/13/2011 Document Reviewed: 02/02/2009 [facility] Patient Information 2012 [place], [place].

## 2012-01-23 NOTE — Assessment & Plan Note (Signed)
Appears to have new onset pruritic rash consistent with scabies after a vacation and exposure to hotel. Will treat as such with permethrin cream. Counseled regarding household decontamination. Followup if not improved in one week. Hydroxyzine when necessary for itching.
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
## 2012-01-23 NOTE — Assessment & Plan Note (Signed)
The largest eczematous/psoriatic-appearing component has improved with steroid treatment clobetasol. No sign overlying superinfection currently. Suspect patient's flare recently has been due to contact with hair dye and repeated chlorine shock treatments of water. Would like for patient to return to Dermatologist given severity and large area of involvement, however she is limited financially.

## 2012-04-21 ENCOUNTER — Other Ambulatory Visit: Payer: Self-pay | Admitting: Family Medicine

## 2012-04-21 MED ORDER — FAMOTIDINE 20 MG PO TABS: 20.0000 mg | ORAL_TABLET | Freq: Two times a day (BID) | ORAL | Status: DC

## 2012-04-23 ENCOUNTER — Other Ambulatory Visit: Payer: Self-pay | Admitting: *Deleted

## 2012-04-24 MED ORDER — LEVOTHYROXINE SODIUM 100 MCG PO TABS: 100.0000 ug | ORAL_TABLET | Freq: Every day | ORAL | Status: DC

## 2012-09-23 ENCOUNTER — Encounter: Payer: Self-pay | Admitting: Family Medicine

## 2012-09-23 ENCOUNTER — Ambulatory Visit (INDEPENDENT_AMBULATORY_CARE_PROVIDER_SITE_OTHER): Payer: Self-pay | Admitting: Family Medicine

## 2012-09-23 VITALS — BP 100/68 | HR 87 | Temp 99.6°F | Ht 63.0 in | Wt 164.0 lb

## 2012-09-23 DIAGNOSIS — J988 Other specified respiratory disorders: Secondary | ICD-10-CM | POA: Insufficient documentation

## 2012-09-23 DIAGNOSIS — B9789 Other viral agents as the cause of diseases classified elsewhere: Secondary | ICD-10-CM

## 2012-09-23 MED ORDER — ONDANSETRON HCL 4 MG PO TABS: 4.0000 mg | ORAL_TABLET | Freq: Three times a day (TID) | ORAL | Status: DC | PRN

## 2012-09-23 MED ORDER — ACETAMINOPHEN-CODEINE #3 300-30 MG PO TABS: 1.0000 | ORAL_TABLET | Freq: Every evening | ORAL | Status: DC | PRN

## 2012-09-23 NOTE — Assessment & Plan Note (Signed)
History and exam consistent with Viral URI.  No concerning findings on lung exam.  Will rx cough medication and zofran, discussed symptomatic care.

## 2012-09-23 NOTE — Progress Notes (Signed)
  Subjective:    Patient ID: Helen Jones, female    DOB: 07/15/1966, 46 y.o.   MRN: [id number]  HPI  Helen Jones comes in with cough, subjective fevers, nasal congestion, nausea and vomiting that started on Friday.  Her daughter is also sick.  She has been taking some Claritin for it with minimal improvement. She denies any chest pain or shortness of breath, denies wheezing.    Review of Systems Pertinent items in HPI    Objective:   Physical Exam BP 100/68  Pulse 87  Temp 99.6 F (37.6 C) (Oral)  Ht 5\' 3"  (1.6 m)  Wt 164 lb (74.39 kg)  BMI 29.05 kg/m2 General appearance: alert, cooperative and no distress Ears: normal TM's and external ear canals both ears Nose: mild congestion Throat: lips, mucosa, and tongue normal; teeth and gums normal Neck: no adenopathy and supple, symmetrical, trachea midline Lungs: clear to auscultation bilaterally Heart: regular rate and rhythm, S1, S2 normal, no murmur, click, rub or gallop Extremities: extremities normal, atraumatic, no cyanosis or edema       Assessment & Plan:

## 2012-09-23 NOTE — Patient Instructions (Signed)
I am sorry you are not feeling well.  You have a viral illness, please try the zofran for nausea and tylenol with codeine for cough.  Be sure to get lots of rest and drink plenty of fluids.

## 2013-04-25 ENCOUNTER — Other Ambulatory Visit: Payer: Self-pay | Admitting: Family Medicine

## 2013-05-31 ENCOUNTER — Emergency Department: Payer: Self-pay | Admitting: Emergency Medicine

## 2013-05-31 ENCOUNTER — Ambulatory Visit: Payer: Self-pay | Admitting: Orthopedic Surgery

## 2013-07-27 ENCOUNTER — Telehealth: Payer: Self-pay | Admitting: Family Medicine

## 2013-07-27 MED ORDER — FAMOTIDINE 20 MG PO TABS: ORAL_TABLET | ORAL | Status: DC

## 2013-07-27 NOTE — Telephone Encounter (Signed)
Fax received for refill authorization. Reordered through [employer]. - RBG

## 2013-09-25 ENCOUNTER — Other Ambulatory Visit: Payer: Self-pay | Admitting: Family Medicine

## 2013-09-25 MED ORDER — FAMOTIDINE 20 MG PO TABS: ORAL_TABLET | ORAL | Status: DC

## 2013-12-25 ENCOUNTER — Other Ambulatory Visit (HOSPITAL_COMMUNITY)
Admission: RE | Admit: 2013-12-25 | Discharge: 2013-12-25 | Disposition: A | Discharge status: Home / Self Care | Payer: No Typology Code available for payment source | Source: Ambulatory Visit | Attending: Family Medicine | Admitting: Family Medicine

## 2013-12-25 ENCOUNTER — Encounter: Payer: Self-pay | Admitting: Family Medicine

## 2013-12-25 ENCOUNTER — Encounter: Payer: Self-pay | Admitting: Gastroenterology

## 2013-12-25 ENCOUNTER — Ambulatory Visit (INDEPENDENT_AMBULATORY_CARE_PROVIDER_SITE_OTHER): Payer: No Typology Code available for payment source | Admitting: Family Medicine

## 2013-12-25 VITALS — BP 137/93 | HR 76 | Temp 97.6°F | Ht 62.5 in | Wt 164.6 lb

## 2013-12-25 DIAGNOSIS — R350 Frequency of micturition: Secondary | ICD-10-CM

## 2013-12-25 DIAGNOSIS — E039 Hypothyroidism, unspecified: Secondary | ICD-10-CM

## 2013-12-25 DIAGNOSIS — Z01419 Encounter for gynecological examination (general) (routine) without abnormal findings: Secondary | ICD-10-CM | POA: Insufficient documentation

## 2013-12-25 DIAGNOSIS — R1310 Dysphagia, unspecified: Secondary | ICD-10-CM

## 2013-12-25 DIAGNOSIS — N898 Other specified noninflammatory disorders of vagina: Secondary | ICD-10-CM

## 2013-12-25 DIAGNOSIS — L219 Seborrheic dermatitis, unspecified: Secondary | ICD-10-CM

## 2013-12-25 DIAGNOSIS — F17200 Nicotine dependence, unspecified, uncomplicated: Secondary | ICD-10-CM

## 2013-12-25 DIAGNOSIS — L259 Unspecified contact dermatitis, unspecified cause: Secondary | ICD-10-CM

## 2013-12-25 DIAGNOSIS — K219 Gastro-esophageal reflux disease without esophagitis: Secondary | ICD-10-CM

## 2013-12-25 DIAGNOSIS — J309 Allergic rhinitis, unspecified: Secondary | ICD-10-CM | POA: Insufficient documentation

## 2013-12-25 DIAGNOSIS — Z124 Encounter for screening for malignant neoplasm of cervix: Secondary | ICD-10-CM

## 2013-12-25 DIAGNOSIS — F339 Major depressive disorder, recurrent, unspecified: Secondary | ICD-10-CM

## 2013-12-25 DIAGNOSIS — L309 Dermatitis, unspecified: Secondary | ICD-10-CM

## 2013-12-25 LAB — POCT URINALYSIS DIPSTICK
BILIRUBIN UA: NEGATIVE
GLUCOSE UA: NEGATIVE
Ketones, UA: NEGATIVE
Leukocytes, UA: NEGATIVE
NITRITE UA: NEGATIVE
Protein, UA: NEGATIVE
RBC UA: NEGATIVE
SPEC GRAV UA: 1.01
UROBILINOGEN UA: 0.2
pH, UA: 6

## 2013-12-25 LAB — POCT WET PREP (WET MOUNT): Clue Cells Wet Prep Whiff POC: NEGATIVE

## 2013-12-25 LAB — LDL CHOLESTEROL, DIRECT: LDL DIRECT: 153 mg/dL — AB

## 2013-12-25 MED ORDER — BUPROPION HCL ER (XL) 150 MG PO TB24: 150.0000 mg | ORAL_TABLET | Freq: Every day | ORAL | Status: DC

## 2013-12-25 MED ORDER — CETIRIZINE HCL 10 MG PO TABS: 10.0000 mg | ORAL_TABLET | Freq: Every day | ORAL | Status: DC

## 2013-12-25 MED ORDER — OMEPRAZOLE 20 MG PO CPDR: 20.0000 mg | DELAYED_RELEASE_CAPSULE | Freq: Every day | ORAL | Status: DC

## 2013-12-25 MED ORDER — LEVOTHYROXINE SODIUM 100 MCG PO TABS: ORAL_TABLET | ORAL | Status: DC

## 2013-12-25 NOTE — Assessment & Plan Note (Signed)
Has not been treated for years, but recognizes that she has had a depressed mood recently, exacerbated by social stressors including father of her 50 year old daughter. Start wellbutrin, reviewed SEs and warning about SI. F/u 1 month.

## 2013-12-25 NOTE — Progress Notes (Signed)
Patient ID: Helen Jones, female   DOB: [birth year], 48 y.o.   MRN: [id number]   Subjective:  HPI:   Helen Jones is a 48 y.o. female with a history of hypothyroidism, tobacco use, allergic rhinitis here for follow up and vaginal discharge.   She reports a scant dark vaginal discharge for the past several weeks. It does not look like blood and does not have an odor. She does not douche. She is post-menopausal and not sexually active. She denies itching or pain.    She has been out of her synthroid for several months and could not afford to refill it. Her insurance recently became effective, so she is now able to afford medications.She desires refills of many medications. She has taken zantac for reflux for decades. Her reflux has been getting worse and she has had episodes of food getting caught in her chest after swallowing without pain or difficulty. She denies weight loss, but thinks she has had night sweats. She has never had an EGD.   She has an extensive smoking history and desires to quit smoking. She is motivated by her sister who recently had a heart attack. She has dyspnea on exertion after walking to the mailbox and back but denies chest pain. She does not exercise. She denies orthopnea, PND, edema. She has a chronic cough in the AM productive of white sputum. She thinks she will be able to quit this time because of her motivation.   She has many stressors in her life including her 108 year old daughter who lives with her father  Review of Systems:  Per HPI. All other systems reviewed and are negative.    Past Medical History: Patient Active Problem List   Diagnosis Date Noted  . Viral respiratory illness 09/23/2012  . Scabies 01/22/2012  . MRSA (methicillin resistant Staphylococcus aureus) infection 12/25/2011  . Seborrheic dermatitis 07/02/2011  . Overweight 04/20/2011  . Eczema 04/20/2011  . VAGINITIS, ATROPHIC, POSTMENOPAUSAL 08/16/2010  . OSTEOARTHRITIS, SACROILIAC  JOINT 10/20/2007  . MIGRAINE NOS W/O INTRACTABLE MIGRAINE 05/12/2007  . ABFND, PAP SMEAR, CERVIX, AS-CUS 05/12/2007  . ANXIETY STATE NOS 01/13/2007  . GERD 01/13/2007  . HOT FLASHES 01/13/2007  . HYPOTHYROIDISM, UNSPECIFIED 12/05/2006  . DEPRESSION, MAJOR, RECURRENT 12/05/2006  . TOBACCO DEPENDENCE 12/05/2006    Medications: reviewed and updated Current Outpatient Prescriptions  Medication Sig Dispense Refill  . buPROPion (WELLBUTRIN XL) 150 MG 24 hr tablet Take 1 tablet (150 mg total) by mouth daily.  30 tablet  5  . cetirizine (ZYRTEC) 10 MG tablet Take 1 tablet (10 mg total) by mouth daily.  30 tablet  11  . diphenhydrAMINE (BENADRYL) 25 MG tablet Take 25 mg by mouth every 6 (six) hours as needed. For allergies      . fluocinolone (FLUOCINOLONE ACETONIDE SCALP) 0.01 % external oil Apply topically to scalp at night. Cover with shower cap and wash in the morning.  120 mL  1  . levothyroxine (SYNTHROID, LEVOTHROID) 100 MCG tablet TAKE ONE TABLET BY MOUTH EVERY DAY  90 tablet  3  . omeprazole (PRILOSEC) 20 MG capsule Take 1 capsule (20 mg total) by mouth daily.  30 capsule  3   No current facility-administered medications for this visit.    Objective:  Physical Exam: BP 137/93  Pulse 76  Temp(Src) 97.6 F (36.4 C) (Oral)  Ht 5' 2.5" (1.588 m)  Wt 164 lb 9.6 oz (74.662 kg)  BMI 29.61 kg/m2  Gen:  48 y.o. female  in NAD HEENT: MMM, oropharynx clear, anicteric sclerae CV: RRR, no MRG, no JVD Resp: Non-labored, CTAB, no wheezes noted Abd: Soft, NTND, BS present, no guarding or organomegaly MSK: No edema noted, full ROM Neuro: Alert and oriented, speech normal Psych: Depressed mood and congruent affect, no SI, HI. Well-groomed.  Pelvic exam: normal external genitalia, vulva, vagina, cervix, uterus and adnexa, PAP: Pap smear done today, exam chaperoned by Helen Jones.    Assessment:     SHOLONDA JOBST is a 48 y.o. female here for follow up, smoking cessation, pap  smear, and dysphagia.     Plan:     See problem list for problem-specific plans.

## 2013-12-25 NOTE — Assessment & Plan Note (Signed)
Has been on pepcid and zantac in the past, but still with symptoms and now dysphagia. Reviewed lifestyle modifications and starting PPI.

## 2013-12-25 NOTE — Assessment & Plan Note (Signed)
Has not taken synthroid in 3 months. Will restart at previous dose and recheck TSH in about 6 weeks. No symptoms.

## 2013-12-25 NOTE — Patient Instructions (Signed)
Thank you for coming in today!  We are checking some labs today, and I will call you if they are abnormal. If you do not hear from me by phone or letter in 2 weeks, please call us as I may have been unable to reach you.   As you leave, make an appointment to follow up with me in 1 month to discuss smoking cessation. We will do fasting labs at that appointment, so please do not eat 8 hours before the appointment.  - Bring all medications in a bag to your visits. - START taking zytrec everyday for allergies - RESTART synthroid daily for hypothyroidism - START taking wellbutrin daily to help with smoking cessation - KEEP using the prescription cream on your ears.  Take care and seek immediate care sooner if you develop any concerns.  Please feel free to call with any questions or concerns at any time, at [phone]. - Dr. Bonner Puna

## 2013-12-25 NOTE — Assessment & Plan Note (Signed)
Start zytrec.

## 2013-12-25 NOTE — Assessment & Plan Note (Signed)
Motivated to quit today. Did not have time to fully counsel, but encouraged efforts and started wellbutrin, which should also help with depressed mood. F/u in 1 month for OV to discuss quitting.

## 2014-01-06 ENCOUNTER — Telehealth: Payer: Self-pay | Admitting: Family Medicine

## 2014-01-06 MED ORDER — ATORVASTATIN CALCIUM 40 MG PO TABS: 40.0000 mg | ORAL_TABLET | Freq: Every day | ORAL | Status: DC

## 2014-01-06 NOTE — Telephone Encounter (Signed)
-   LDL elevated, so will start statin.  - Pap smear normal.

## 2014-02-09 ENCOUNTER — Encounter: Payer: Self-pay | Admitting: Family Medicine

## 2014-02-09 ENCOUNTER — Ambulatory Visit (INDEPENDENT_AMBULATORY_CARE_PROVIDER_SITE_OTHER): Payer: No Typology Code available for payment source | Admitting: Family Medicine

## 2014-02-09 VITALS — BP 123/79 | HR 77 | Temp 98.1°F | Ht 63.0 in | Wt 168.7 lb

## 2014-02-09 DIAGNOSIS — E039 Hypothyroidism, unspecified: Secondary | ICD-10-CM

## 2014-02-09 DIAGNOSIS — Z23 Encounter for immunization: Secondary | ICD-10-CM

## 2014-02-09 DIAGNOSIS — E559 Vitamin D deficiency, unspecified: Secondary | ICD-10-CM

## 2014-02-09 DIAGNOSIS — F17200 Nicotine dependence, unspecified, uncomplicated: Secondary | ICD-10-CM

## 2014-02-09 LAB — TSH: TSH: 0.902 u[IU]/mL (ref 0.350–4.500)

## 2014-02-09 MED ORDER — NICOTINE 7 MG/24HR TD PT24: 7.0000 mg | MEDICATED_PATCH | Freq: Every day | TRANSDERMAL | Status: DC

## 2014-02-09 NOTE — Progress Notes (Signed)
Patient ID: Helen Jones, female   DOB: [birth year], 48 y.o.   MRN: [id number]   Subjective:  HPI:   Helen Jones is a 48 y.o. female with a history of hypothyroidism, GERD, hyperlipidemia and cigarette smoking here for follow up.  [name] reports improved symptoms of GERD while taking the PPI but still feels that food intermittently gets stuck in her chest. No trouble swallowing or pain swallowing. No weight loss, fever, chills, night sweats.   She has a 70 pack-year history (2 ppd x 35 years) but has been slowly cutting down since 8 years ago when her daughter was born 68 years ago. Currently smoking 10 cigarettes per day. She has tried to quit cold [place], with chantix, and with nicotine gum, but has never quit for more than 1 day. She thinks she has a better than 50/50 chance of quitting with assistance. She is most interested in the nicotine patch.   No one else smokes in her family and she smokes outside the house away from her children.   Denies history of seizures.  Review of Systems:  Per HPI. All other systems reviewed and are negative.    Past Medical History: Patient Active Problem List   Diagnosis Date Noted  . Allergic rhinitis 12/25/2013  . Viral respiratory illness 09/23/2012  . Scabies 01/22/2012  . MRSA (methicillin resistant Staphylococcus aureus) infection 12/25/2011  . Seborrheic dermatitis 07/02/2011  . Overweight 04/20/2011  . Eczema 04/20/2011  . VAGINITIS, ATROPHIC, POSTMENOPAUSAL 08/16/2010  . OSTEOARTHRITIS, SACROILIAC JOINT 10/20/2007  . MIGRAINE NOS W/O INTRACTABLE MIGRAINE 05/12/2007  . ABFND, PAP SMEAR, CERVIX, AS-CUS 05/12/2007  . ANXIETY STATE NOS 01/13/2007  . GERD 01/13/2007  . HOT FLASHES 01/13/2007  . HYPOTHYROIDISM, UNSPECIFIED 12/05/2006  . DEPRESSION, MAJOR, RECURRENT 12/05/2006  . TOBACCO DEPENDENCE 12/05/2006    Medications: reviewed and updated Current Outpatient Prescriptions  Medication Sig Dispense Refill  .  atorvastatin (LIPITOR) 40 MG tablet Take 1 tablet (40 mg total) by mouth daily.  90 tablet  3  . buPROPion (WELLBUTRIN XL) 150 MG 24 hr tablet Take 1 tablet (150 mg total) by mouth daily.  30 tablet  5  . cetirizine (ZYRTEC) 10 MG tablet Take 1 tablet (10 mg total) by mouth daily.  30 tablet  11  . levothyroxine (SYNTHROID, LEVOTHROID) 100 MCG tablet TAKE ONE TABLET BY MOUTH EVERY DAY  90 tablet  3  . omeprazole (PRILOSEC) 20 MG capsule Take 1 capsule (20 mg total) by mouth daily.  30 capsule  3  . diphenhydrAMINE (BENADRYL) 25 MG tablet Take 25 mg by mouth every 6 (six) hours as needed. For allergies      . fluocinolone (FLUOCINOLONE ACETONIDE SCALP) 0.01 % external oil Apply topically to scalp at night. Cover with shower cap and wash in the morning.  120 mL  1  . nicotine (NICODERM CQ - DOSED IN MG/24 HR) 7 mg/24hr patch Place 1 patch (7 mg total) onto the skin daily.  28 patch  1   No current facility-administered medications for this visit.    Objective:  Physical Exam: BP 123/79  Pulse 77  Temp(Src) 98.1 F (36.7 C) (Oral)  Ht 5\' 3"  (1.6 m)  Wt 168 lb 11.2 oz (76.522 kg)  BMI 29.89 kg/m2  Gen: Pleasant 48 y.o. female in NAD HEENT: MMM, EOMI, PERRL, anicteric sclerae CV: RRR, no MRG, no JVD Resp: Non-labored, CTAB, no wheezes noted, no clubbing or acrocyanosis.  Abd: Soft, NTND, BS present, no guarding  or organomegaly MSK: No edema noted, full ROM Neuro: Alert and oriented, speech normal    Assessment:     JERICA CREEGAN is a 48 y.o. female here for assistance with tobacco cessation.     Plan:     See problem list for problem-specific plans.

## 2014-02-09 NOTE — Assessment & Plan Note (Signed)
Many unsuccessful attempts but no ongoing triggers. Initiated nicoderm CQ patches 7mg . Suspect this is slightly under-dosed but pt will start and try to wean cigarettes/day slowly. Continuing wellbutrin. Patient counseled on potential adverse effects, including insomnia, and potential change in mood. Close follow up.

## 2014-02-09 NOTE — Patient Instructions (Signed)
Thank you for coming in today!  We are checking some labs today, and I will call you if they are abnormal. If you do not hear from me by phone or letter in 2 weeks, please call us as I may have been unable to reach you.   - Get the procedure done on Thursday.  - Start Lipitor 40mg  daily.  - Start applying the patch once a day and cut down your cigarette intake as you can.  - As you leave, make an appointment to follow up with me in 3-4 weeks.   Take care and seek immediate care sooner if you develop any concerns.  Please feel free to call with any questions or concerns at any time, at [phone]. - Dr. Bonner Puna

## 2014-02-10 LAB — VITAMIN D 25 HYDROXY (VIT D DEFICIENCY, FRACTURES): VIT D 25 HYDROXY: 24 ng/mL — AB (ref 30–89)

## 2014-02-11 ENCOUNTER — Ambulatory Visit (INDEPENDENT_AMBULATORY_CARE_PROVIDER_SITE_OTHER): Payer: No Typology Code available for payment source | Admitting: Gastroenterology

## 2014-02-11 ENCOUNTER — Encounter: Payer: Self-pay | Admitting: Gastroenterology

## 2014-02-11 ENCOUNTER — Telehealth: Payer: Self-pay | Admitting: Family Medicine

## 2014-02-11 VITALS — BP 120/80 | HR 86 | Ht 63.0 in | Wt 171.6 lb

## 2014-02-11 DIAGNOSIS — R1319 Other dysphagia: Secondary | ICD-10-CM

## 2014-02-11 DIAGNOSIS — E559 Vitamin D deficiency, unspecified: Secondary | ICD-10-CM | POA: Insufficient documentation

## 2014-02-11 DIAGNOSIS — K219 Gastro-esophageal reflux disease without esophagitis: Secondary | ICD-10-CM

## 2014-02-11 DIAGNOSIS — Z1211 Encounter for screening for malignant neoplasm of colon: Secondary | ICD-10-CM

## 2014-02-11 MED ORDER — VITAMIN D (ERGOCALCIFEROL) 1.25 MG (50000 UNIT) PO CAPS: 50000.0000 [IU] | ORAL_CAPSULE | ORAL | Status: DC

## 2014-02-11 NOTE — Telephone Encounter (Signed)
TSH wnl Vit D <30  Rx ergocalciferol 50k units q week x 12 weeks. Recheck levels in 3 months.

## 2014-02-11 NOTE — Progress Notes (Signed)
    History of Present Illness: This is a 48 year old female is chronic GERD. She relates occasional nighttime reflux and regurgitation. Symptoms have been under good control on daily Prilosec. She has had intermittent solid food dysphagia for about the past year. She notes problems with meats and breads. The symptoms have not improved on Prilosec. She states she had a colonoscopy in 1998 [facility] was unclear as to the reason for the exam and findings. Denies weight loss, abdominal pain, constipation, diarrhea, change in stool caliber, melena, hematochezia, nausea, vomiting, chest pain.  Review of Systems: Pertinent positive and negative review of systems were noted in the above HPI section. All other review of systems were otherwise negative.  Current Medications, Allergies, Past Medical History, Past Surgical History, Family History and Social History were reviewed in [employer] record.  Physical Exam: General: Well developed , well nourished, no acute distress Head: Normocephalic and atraumatic Eyes:  sclerae anicteric, EOMI Ears: Normal auditory acuity Mouth: No deformity or lesions, dentures Neck: Supple, no masses or thyromegaly Lungs: Clear throughout to auscultation Heart: Regular rate and rhythm; no murmurs, rubs or bruits Abdomen: Soft, non tender and non distended. No masses, hepatosplenomegaly or hernias noted. Normal Bowel sounds Musculoskeletal: Symmetrical with no gross deformities  Skin: No lesions on visible extremities Pulses:  Normal pulses noted Extremities: No clubbing, cyanosis, edema or deformities noted Neurological: Alert oriented x 4, grossly nonfocal Cervical Nodes:  No significant cervical adenopathy Inguinal Nodes: No significant inguinal adenopathy Psychological:  Alert and cooperative. Normal mood and affect  Assessment and Recommendations:  1. Dysphagia and GERD. Suspect an esophageal stricture. Continue omeprazole and antireflux  measures. Schedule BA esophagram and EGD. The risks, benefits, and alternatives to endoscopy with possible biopsy and possible dilation were discussed with the patient and they consent to proceed.   2. CRC, average risk. Screening colonoscopy at age 29.

## 2014-02-11 NOTE — Assessment & Plan Note (Signed)
Rx ergocalciferol 50k units q week x 12 weeks. Recheck levels in 3 months.

## 2014-02-11 NOTE — Patient Instructions (Signed)
You will be due for a recall colonoscopy in 08/2014. We will send you a reminder in the mail when it gets closer to that time.  You have been scheduled for an endoscopy with propofol. Please follow written instructions given to you at your visit today. If you use inhalers (even only as needed), please bring them with you on the day of your procedure. Your physician has requested that you go to www.startemmi.com and enter the access code given to you at your visit today. This web site gives a general overview about your procedure. However, you should still follow specific instructions given to you by our office regarding your preparation for the procedure.  You have been scheduled for a Barium Esophogram with tablet at [facility] (1st floor of the hospital) on 02/19/14 at 9:30 am. Please arrive 15 minutes prior to your appointment for registration. Make certain not to have anything to eat or drink 6 hours prior to your test. If you need to reschedule for any reason, please contact radiology at [phone] to do so. __________________________________________________________________ A barium swallow is an examination that concentrates on views of the esophagus. This tends to be a double contrast exam (barium and two liquids which, when combined, create a gas to distend the wall of the oesophagus) or single contrast (non-ionic iodine based). The study is usually tailored to your symptoms so a good history is essential. Attention is paid during the study to the form, structure and configuration of the esophagus, looking for functional disorders (such as aspiration, dysphagia, achalasia, motility and reflux) EXAMINATION You may be asked to change into a gown, depending on the type of swallow being performed. A radiologist and radiographer will perform the procedure. The radiologist will advise you of the type of contrast selected for your procedure and direct you during the exam. You will be asked to  stand, sit or lie in several different positions and to hold a small amount of fluid in your mouth before being asked to swallow while the imaging is performed .In some instances you may be asked to swallow barium coated marshmallows to assess the motility of a solid food bolus. The exam can be recorded as a digital or video fluoroscopy procedure. POST PROCEDURE It will take 1-2 days for the barium to pass through your system. To facilitate this, it is important, unless otherwise directed, to increase your fluids for the next 24-48hrs and to resume your normal diet.  This test typically takes about 30 minutes to perform. __________________________________________________________________________________  Diet for Gastroesophageal Reflux Disease, Adult Reflux (acid reflux) is when acid from your stomach flows up into the esophagus. When acid comes in contact with the esophagus, the acid causes irritation and soreness (inflammation) in the esophagus. When reflux happens often or so severely that it causes damage to the esophagus, it is called gastroesophageal reflux disease (GERD). Nutrition therapy can help ease the discomfort of GERD. FOODS OR DRINKS TO AVOID OR LIMIT  Smoking or chewing tobacco. Nicotine is one of the most potent stimulants to acid production in the gastrointestinal tract.  Caffeinated and decaffeinated coffee and black tea.  Regular or low-calorie carbonated beverages or energy drinks (caffeine-free carbonated beverages are allowed).   Strong spices, such as black pepper, white pepper, red pepper, cayenne, curry powder, and chili powder.  Peppermint or spearmint.  Chocolate.  High-fat foods, including meats and fried foods. Extra added fats including oils, butter, salad dressings, and nuts. Limit these to less than 8 tsp per  day.  Fruits and vegetables if they are not tolerated, such as citrus fruits or tomatoes.  Alcohol.  Any food that seems to aggravate your  condition. If you have questions regarding your diet, call your caregiver or a registered dietitian. OTHER THINGS THAT MAY HELP GERD INCLUDE:   Eating your meals slowly, in a relaxed setting.  Eating 5 to 6 small meals per day instead of 3 large meals.  Eliminating food for a period of time if it causes distress.  Not lying down until 3 hours after eating a meal.  Keeping the head of your bed raised 6 to 9 inches (15 to 23 cm) by using a foam wedge or blocks under the legs of the bed. Lying flat may make symptoms worse.  Being physically active. Weight loss may be helpful in reducing reflux in overweight or obese adults.  Wear loose fitting clothing EXAMPLE MEAL PLAN This meal plan is approximately 2,000 calories based on [email or web address] meal planning guidelines. Breakfast   cup cooked oatmeal.  1 cup strawberries.  1 cup low-fat milk.  1 oz almonds. Snack  1 cup cucumber slices.  6 oz yogurt (made from low-fat or fat-free milk). Lunch  2 slice whole-wheat bread.  2 oz sliced [place].  2 tsp mayonnaise.  1 cup blueberries.  1 cup snap peas. Snack  6 whole-wheat crackers.  1 oz string cheese. Dinner   cup brown rice.  1 cup mixed veggies.  1 tsp olive oil.  3 oz grilled fish. Document Released: 09/24/2005 Document Revised: 12/17/2011 Document Reviewed: 08/10/2011 [facility] Patient Information 2014 [place], [place].  CC: Vance Gather, MD

## 2014-02-18 ENCOUNTER — Encounter: Payer: Self-pay | Admitting: Gastroenterology

## 2014-02-19 ENCOUNTER — Ambulatory Visit (HOSPITAL_COMMUNITY): Admission: RE | Admit: 2014-02-19 | Payer: No Typology Code available for payment source | Source: Ambulatory Visit

## 2014-03-02 ENCOUNTER — Ambulatory Visit: Payer: No Typology Code available for payment source | Admitting: Family Medicine

## 2014-04-02 ENCOUNTER — Encounter: Payer: No Typology Code available for payment source | Admitting: Gastroenterology

## 2014-06-15 ENCOUNTER — Encounter: Payer: Self-pay | Admitting: Gastroenterology

## 2014-12-15 ENCOUNTER — Telehealth: Payer: Self-pay | Admitting: Family Medicine

## 2014-12-15 NOTE — Telephone Encounter (Signed)
Helen Jones needs to follow up for a general medical exam now that she is 8.

## 2015-01-10 ENCOUNTER — Encounter: Payer: Self-pay | Admitting: Gastroenterology

## 2015-01-28 NOTE — Consult Note (Signed)
PATIENT NAME:  Helen Jones, LOCOCO MR#:  [id number] DATE OF BIRTH:  November 07, 1965  DATE OF CONSULTATION:  05/31/2013  CONSULTING PHYSICIAN:  Mali E. Zeinab Rodwell, MD  REASON FOR CONSULTATION:  Left wrist pain.   HISTORY OF PRESENT ILLNESS:  This is a 49 year old female who fell and sustained an injury to her left wrist. She presented to the Emergency Room with pain and deformity. She rated her pain as an 8 out of 10, sharp in nature, exacerbated by movement of the wrist and relieved by rest. She did not injure her other 3 extremities.   PAST MEDICAL HISTORY:  GERD, hypothyroidism.   PAST SURGICAL HISTORY:  Cholecystectomy, tubal ligation.   MEDICATIONS:  None.   ALLERGIES:  No known drug allergies.   SOCIAL HISTORY:  The patient denies any alcohol or tobacco use.   FAMILY HISTORY:  Noncontributory.   REVIEW OF SYSTEMS:  No chest pain. No shortness of breath.   PHYSICAL EXAMINATION: GENERAL:  The patient is well-appearing, well-nourished, no acute distress.  EXTREMITIES: Examination of the left wrist reveals overlying skin to be intact, without erythema or ecchymosis. There is some soft tissue swelling. There is deformity present. She has tenderness to palpation at her wrist. There is gross instability at the fracture site. She is able to wiggle her fingers. Some decreased range of motion. She does have good strength. She has intact sensation distally. Good distal pulses. Examination of the right wrist was performed in a similar manner, and was free of any abnormalities.   IMAGES:  X-rays of the left wrist were obtained and reviewed, which reveal evidence of a displaced and angulated extra-articular distal radius fracture.   IMPRESSION:   A 49 year old female with a left distal radius fracture.   PLAN:  The patient will undergo closed reduction in the Emergency Department. She will follow up with Orthopedics in the office for further care.   PROCEDURE:  The patient was seen and identified.  Timeout was performed. The patient was given sedation in the form of propofol by the Emergency Room physicians. Reduction maneuver was utilized to improve the alignment of the distal radius fracture. A sugar tong splint was applied. Post-reduction radiographs reveal improved height and inclination. However, there is still some residual displacement and dorsal angulation. The patient can be nonweightbearing to the left upper extremity, and she will follow up with Orthopedics next week for recommendations on nonoperative versus operative treatment.     ____________________________ Mali E. Lyrika Souders, MD ces:mr D: 05/31/2013 16:15:21 ET T: 05/31/2013 18:50:33 ET JOB#: [id number]  cc: Mali E. Jonique Kulig, MD, <Dictator> Mali E Shaneca Orne MD ELECTRONICALLY SIGNED 05/31/2013 19:18

## 2015-02-04 ENCOUNTER — Encounter: Payer: Self-pay | Admitting: Family Medicine

## 2015-02-04 ENCOUNTER — Ambulatory Visit (INDEPENDENT_AMBULATORY_CARE_PROVIDER_SITE_OTHER): Payer: No Typology Code available for payment source | Admitting: Family Medicine

## 2015-02-04 VITALS — BP 123/80 | HR 78 | Temp 98.5°F | Wt 158.0 lb

## 2015-02-04 DIAGNOSIS — J309 Allergic rhinitis, unspecified: Secondary | ICD-10-CM | POA: Diagnosis not present

## 2015-02-04 MED ORDER — FLUTICASONE PROPIONATE 50 MCG/ACT NA SUSP: 2.0000 | Freq: Every day | NASAL | Status: DC

## 2015-02-04 MED ORDER — FEXOFENADINE HCL 180 MG PO TABS: 180.0000 mg | ORAL_TABLET | Freq: Every day | ORAL | Status: DC

## 2015-02-04 NOTE — Progress Notes (Signed)
    Subjective   Helen Jones is a 49 y.o. female that presents for a same day visit  1. Congestion: Symptoms started three weeks ago with chest congestion but head congestion started this week. Symptoms include sneezing, rhinorrhea and coughing. She has been taking Mucinex and benadryl, however benadryl has been making her sleepy. She has been using generic acetaminophen-diphenhydramine-phenylephrine which has been helping No fevers. She has some chills and fatigue. No sick contacts.   ROS Per HPI  History  Substance Use Topics  . Smoking status: Current Every Day Smoker -- 0.50 packs/day    Types: Cigarettes  . Smokeless tobacco: Not on file  . Alcohol Use: No    No Known Allergies  Objective   BP 123/80 mmHg  Pulse 78  Temp(Src) 98.5 F (36.9 C) (Oral)  Wt 158 lb (71.668 kg)  General: well appearing female, in no distress HEENT: TMs not clearly visualized secondary to anatomy. Nares patent. Oropharynx clear and mildly erythematous. No cervical lymphadenopathy  Assessment and Plan   Meds ordered this encounter  Medications  . fexofenadine (ALLEGRA) 180 MG tablet    Sig: Take 1 tablet (180 mg total) by mouth daily.    Dispense:  90 tablet    Refill:  3  . fluticasone (FLONASE) 50 MCG/ACT nasal spray    Sig: Place 2 sprays into both nostrils daily.    Dispense:  16 g    Refill:  6    Allergic rhinitis: possible also has a mild viral infection, however, symptoms more likely related to allergies/post nasal drip  Allegra  Flonase

## 2015-02-04 NOTE — Patient Instructions (Signed)
Thank you for coming to see me today. It was a pleasure. Today we talked about:   Allergic rhinitis: I think your symptoms are consistent with allergies. Please Stop taking your over the counter medication and instead start taking the Allegra and Flonase.   Please make an appointment to see Dr. Bonner Puna for follow-up.  If you have any questions or concerns, please do not hesitate to call the office at [phone].  Sincerely,  Cordelia Poche, MD   Fluticasone nasal spray (Flonase) What is this medicine? FLUTICASONE (floo TIK a sone) is a corticosteroid. This medicine is used to treat the symptoms of allergies like sneezing, itchy red eyes, and itchy, runny, or stuffy nose. This medicine may be used for other purposes; ask your health care provider or pharmacist if you have questions. COMMON BRAND NAME(S): Flonase, Flonase Allergy Relief, Veramyst What should I tell my health care provider before I take this medicine? They need to know if you have any of these conditions: -infection, like tuberculosis, herpes, or fungal infection -recent surgery on nose or sinuses -taking corticosteroid by mouth -an unusual or allergic reaction to fluticasone, steroids, other medicines, foods, dyes, or preservatives -pregnant or trying to get pregnant -breast-feeding How should I use this medicine? This medicine is for use in the nose. Follow the directions on your product or prescription label. This medicine works best if used at regular intervals. Do not use more often than directed. Make sure that you are using your nasal spray correctly. After 6 months of daily use without a prescription, talk to your doctor or health care professional before using it for a longer time. Ask your doctor or health care professional if you have any questions. Talk to your pediatrician regarding the use of this medicine in children. While this drug may be used for children as young as 42 years old for selected conditions,  precautions do apply. After 2 months of daily use without a prescription in a child, talk to your pediatrician before using it for a longer time. Overdosage: If you think you have taken too much of this medicine contact a poison control center or emergency room at once. NOTE: This medicine is only for you. Do not share this medicine with others. What if I miss a dose? If you miss a dose, use it as soon as you remember. If it is almost time for your next dose, use only that dose and continue with your regular schedule. Do not use double or extra doses. What may interact with this medicine? -ketoconazole -metyrapone -some medicines for HIV -vaccines This list may not describe all possible interactions. Give your health care provider a list of all the medicines, herbs, non-prescription drugs, or dietary supplements you use. Also tell them if you smoke, drink alcohol, or use illegal drugs. Some items may interact with your medicine. What should I watch for while using this medicine? Visit your doctor or health care professional for regular checks on your progress. Some symptoms may improve within 12 hours after starting use. Check with your doctor or health care professional if there is no improvement in your condition after 3 weeks of use. Do not come in contact with people who have chickenpox or the measles while you are taking this medicine. If you do, call your doctor right away. What side effects may I notice from receiving this medicine? Side effects that you should report to your doctor or health care professional as soon as possible: -allergic reactions like skin  rash, itching or hives, swelling of the face, lips, or tongue -changes in vision -flu-like symptoms -white patches or sores in the mouth or nose Side effects that usually do not require medical attention (report to your doctor or health care professional if they continue or are bothersome): -burning or irritation inside the nose or  throat -cough -headache -nosebleed -unusual taste or smell This list may not describe all possible side effects. Call your doctor for medical advice about side effects. You may report side effects to FDA at 1-800-FDA-1088. Where should I keep my medicine? Keep out of the reach of children. Store at room temperature between 15 and 30 degrees C (59 and 86 degrees F). Throw away any unused medicine after the expiration date. NOTE: This sheet is a summary. It may not cover all possible information. If you have questions about this medicine, talk to your doctor, pharmacist, or health care provider.  2015, [employer] Standard. (2014-01-14 15:55:20)

## 2015-02-04 NOTE — Assessment & Plan Note (Signed)
   Allegra 180mg  daily  Flonase

## 2015-02-16 ENCOUNTER — Telehealth: Payer: Self-pay | Admitting: Family Medicine

## 2015-02-16 DIAGNOSIS — Z1239 Encounter for other screening for malignant neoplasm of breast: Secondary | ICD-10-CM

## 2015-02-16 NOTE — Telephone Encounter (Signed)
I called a couple times and left a voice message for her to call back. When she does, please let her know the following:   Please schedule an appointment for general examination. We need to follow your blood pressure, vitamin D levels, and general health. I have also ordered a mammogram for you to get done at [facility] now that you're 48. This is very important to screen for breast cancer.   Thank you.

## 2015-03-02 ENCOUNTER — Ambulatory Visit: Payer: No Typology Code available for payment source | Admitting: Family Medicine

## 2015-03-03 ENCOUNTER — Other Ambulatory Visit: Payer: Self-pay | Admitting: Family Medicine

## 2015-03-17 ENCOUNTER — Ambulatory Visit (HOSPITAL_COMMUNITY): Payer: No Typology Code available for payment source

## 2015-03-18 ENCOUNTER — Ambulatory Visit (HOSPITAL_COMMUNITY): Payer: No Typology Code available for payment source

## 2015-03-25 ENCOUNTER — Ambulatory Visit (HOSPITAL_COMMUNITY): Payer: No Typology Code available for payment source | Attending: Family Medicine

## 2015-05-26 ENCOUNTER — Other Ambulatory Visit: Payer: Self-pay | Admitting: Family Medicine

## 2015-05-26 NOTE — Telephone Encounter (Signed)
Please have pt schedule an appointment with me. It's been > 1 year and we need to check her thyroid.

## 2015-05-27 ENCOUNTER — Encounter: Payer: Self-pay | Admitting: *Deleted

## 2015-05-27 NOTE — Telephone Encounter (Signed)
Mailed letter to pt today to schedule FU appt with PCP. Braxton Weisbecker, CMA.

## 2015-07-13 ENCOUNTER — Encounter: Payer: No Typology Code available for payment source | Admitting: Family Medicine

## 2015-07-18 ENCOUNTER — Ambulatory Visit (INDEPENDENT_AMBULATORY_CARE_PROVIDER_SITE_OTHER): Payer: No Typology Code available for payment source | Admitting: Family Medicine

## 2015-07-18 ENCOUNTER — Encounter: Payer: Self-pay | Admitting: Family Medicine

## 2015-07-18 ENCOUNTER — Other Ambulatory Visit: Payer: Self-pay | Admitting: Family Medicine

## 2015-07-18 VITALS — BP 129/86 | HR 71 | Temp 98.1°F | Wt 153.6 lb

## 2015-07-18 DIAGNOSIS — E663 Overweight: Secondary | ICD-10-CM | POA: Diagnosis not present

## 2015-07-18 DIAGNOSIS — Z1239 Encounter for other screening for malignant neoplasm of breast: Secondary | ICD-10-CM | POA: Diagnosis not present

## 2015-07-18 DIAGNOSIS — Z5181 Encounter for therapeutic drug level monitoring: Secondary | ICD-10-CM

## 2015-07-18 DIAGNOSIS — Z1159 Encounter for screening for other viral diseases: Secondary | ICD-10-CM

## 2015-07-18 DIAGNOSIS — Z1231 Encounter for screening mammogram for malignant neoplasm of breast: Secondary | ICD-10-CM

## 2015-07-18 DIAGNOSIS — Z1211 Encounter for screening for malignant neoplasm of colon: Secondary | ICD-10-CM

## 2015-07-18 DIAGNOSIS — E7800 Pure hypercholesterolemia, unspecified: Secondary | ICD-10-CM

## 2015-07-18 DIAGNOSIS — E039 Hypothyroidism, unspecified: Secondary | ICD-10-CM

## 2015-07-18 DIAGNOSIS — E559 Vitamin D deficiency, unspecified: Secondary | ICD-10-CM

## 2015-07-18 DIAGNOSIS — F17200 Nicotine dependence, unspecified, uncomplicated: Secondary | ICD-10-CM

## 2015-07-18 LAB — COMPREHENSIVE METABOLIC PANEL
ALBUMIN: 4.5 g/dL (ref 3.6–5.1)
ALK PHOS: 93 U/L (ref 33–130)
ALT: 12 U/L (ref 6–29)
AST: 17 U/L (ref 10–35)
BILIRUBIN TOTAL: 0.2 mg/dL (ref 0.2–1.2)
BUN: 19 mg/dL (ref 7–25)
CHLORIDE: 105 mmol/L (ref 98–110)
CO2: 27 mmol/L (ref 20–31)
CREATININE: 0.94 mg/dL (ref 0.50–1.05)
Calcium: 10.1 mg/dL (ref 8.6–10.4)
Glucose, Bld: 77 mg/dL (ref 65–99)
Potassium: 4.5 mmol/L (ref 3.5–5.3)
SODIUM: 141 mmol/L (ref 135–146)
TOTAL PROTEIN: 7.5 g/dL (ref 6.1–8.1)

## 2015-07-18 LAB — CBC
HCT: 43.5 % (ref 36.0–46.0)
Hemoglobin: 14.8 g/dL (ref 12.0–15.0)
MCH: 32.5 pg (ref 26.0–34.0)
MCHC: 34 g/dL (ref 30.0–36.0)
MCV: 95.6 fL (ref 78.0–100.0)
MPV: 12 fL (ref 8.6–12.4)
PLATELETS: 247 10*3/uL (ref 150–400)
RBC: 4.55 MIL/uL (ref 3.87–5.11)
RDW: 13.1 % (ref 11.5–15.5)
WBC: 7.3 10*3/uL (ref 4.0–10.5)

## 2015-07-18 LAB — POCT GLYCOSYLATED HEMOGLOBIN (HGB A1C): HEMOGLOBIN A1C: 5.2

## 2015-07-18 LAB — HEPATITIS C ANTIBODY: HCV AB: NEGATIVE

## 2015-07-18 MED ORDER — ROSUVASTATIN CALCIUM 10 MG PO TABS: 10.0000 mg | ORAL_TABLET | Freq: Every day | ORAL | Status: DC

## 2015-07-18 NOTE — Progress Notes (Signed)
Subjective: Helen Jones is a 49 y.o. female accompanied by her daughter, presenting for annual physical exam  She has no complaints at this time. Reports she stopped taking lipitor because it made her muscles hurt. She tried taking it every other day but still had problems. She has had no problems taking synthroid and reports 100% compliance. She took all the vitamin D capsules but has not had these in some time.   She reports wheezing intermittently for the past year or so and some associated non-exertional chest tightness though denies this at this time. She takes no inhalers and has not had pulmonary function testing, though has a long smoking history.   - ROS: Denies fever, chills, weight loss, changes in vision or hearing, headache, cough, sore throat, chest pain, palpitations, shortness of breath, abdominal pain, nausea, vomiting, changes in bowel habits, blood in stool, change in bladder habits, myalgias, arthralgias, and rash.  - She smokes roughly 1/2 ppd (sometimes more, sometimes less depending on her stress level) and is somewhat interested in quitting though she would like to table discussions for interventions til a follow up visit. She has tried chantix twice before with some improvement in cravings but no complete cessation. She has also tried nicotine patches.  - Pap is up todate (ASC-US on problem list but normal pap smears documented 2007, 2011, 2015. Next due with cotesting in 2018.  - Mammogram is due, has scheduled this twice but no-showed - Colonoscopy is due  Objective: BP 129/86 mmHg  Pulse 71  Temp(Src) 98.1 F (36.7 C) (Oral)  Wt 153 lb 9.6 oz (69.673 kg) Gen: Overweight, pleasant, well-appearing 49 y.o.female in NAD HEENT: Normocephalic, sclerae/conjunctivae clear, PERRL, MMM, posterior oropharynx clear, fair dentition Neck: neck supple, no masses appreciated; thyroid not enlarged  Pulm: Non-labored; CTAB, + prolonged expiration and faint, scattered wheezes.   CV: Regular rate, no murmur appreciated; distal pulses intact/symmetric; no LE edema GI: + BS; soft, non-tender, non-distended, no HSM, no hernia GU: Deferred Lymph: No cervical, supraclavicular, or axillary lymphadenopathy Skin: No rashes, wounds, ulcers MSK: Normal gait and station; no digital clubbing/cyanosis, no frank joint deformity/effusion, full active ROM, no point muscle/bony tenderness in spine Neuro: CN II-XII without deficits, sensation intact to light touch, patellar DTRs 2+ bilaterally Psych: A&Ox3, mood euthymic with broad affect, intact recent and remote memory, good judgment, good insight, speech clear and coherent  Assessment/Plan: Helen Jones is a 49 y.o. female here for annual healthcare maintenance.  Hypothyroidism Recheck TSH. Denies typical symptoms of sub or supra therapeutic dosing.   TOBACCO DEPENDENCE Still interested in quitting and reports she will schedule a follow up to discuss interventions, though she has tried wellbutrin, chantix, and NRT.   Vitamin D deficiency Recheck vitamin D after repletion doses last year. Also reviewed intake of fatty fishes.

## 2015-07-18 NOTE — Patient Instructions (Addendum)
Thank you for coming in today!  We are checking some labs today, and we'll call you if they are abnormal. If you do not hear from me by phone or letter in 2 weeks, please call us as we may have been unable to reach you.   - Bring all medications in a bag to your visits. - Schedule your mammogram and colonoscopy.  - Consider stopping smoking and we'll talk about this at your next visit.  - START taking crestor once a da for cholesterol. - Schedule another visit to get pulmonary function testing performed in our pharmacy clinic.   Our clinic's number is [phone]. Feel free to call any time with questions or concerns. We will answer any questions after hours with our 24-hour emergency line at that number as well.   - Dr. Bonner Puna  Health Maintenance, Female Adopting a healthy lifestyle and getting preventive care can go a long way to promote health and wellness. Talk with your health care provider about what schedule of regular examinations is right for you. This is a good chance for you to check in with your provider about disease prevention and staying healthy. In between checkups, there are plenty of things you can do on your own. Experts have done a lot of research about which lifestyle changes and preventive measures are most likely to keep you healthy. Ask your health care provider for more information. WEIGHT AND DIET  Eat a healthy diet  Be sure to include plenty of vegetables, fruits, low-fat dairy products, and lean protein.  Do not eat a lot of foods high in solid fats, added sugars, or salt.  Get regular exercise. This is one of the most important things you can do for your health.  Most adults should exercise for at least 150 minutes each week. The exercise should increase your heart rate and make you sweat (moderate-intensity exercise).  Most adults should also do strengthening exercises at least twice a week. This is in addition to the moderate-intensity exercise.  Maintain  a healthy weight  Body mass index (BMI) is a measurement that can be used to identify possible weight problems. It estimates body fat based on height and weight. Your health care provider can help determine your BMI and help you achieve or maintain a healthy weight.  For females 12 years of age and older:   A BMI below 18.5 is considered underweight.  A BMI of 18.5 to 24.9 is normal.  A BMI of 25 to 29.9 is considered overweight.  A BMI of 30 and above is considered obese.  Watch levels of cholesterol and blood lipids  You should start having your blood tested for lipids and cholesterol at 51 years of age, then have this test every 5 years.  You may need to have your cholesterol levels checked more often if:  Your lipid or cholesterol levels are high.  You are older than 51 years of age.  You are at high risk for heart disease.  CANCER SCREENING   Lung Cancer  Lung cancer screening is recommended for adults 57-34 years old who are at high risk for lung cancer because of a history of smoking.  A yearly low-dose CT scan of the lungs is recommended for people who:  Currently smoke.  Have quit within the past 15 years.  Have at least a 30-pack-year history of smoking. A pack year is smoking an average of one pack of cigarettes a day for 1 year.  Yearly screening should  continue until it has been 15 years since you quit.  Yearly screening should stop if you develop a health problem that would prevent you from having lung cancer treatment.  Breast Cancer  Practice breast self-awareness. This means understanding how your breasts normally appear and feel.  It also means doing regular breast self-exams. Let your health care provider know about any changes, no matter how small.  If you are in your 20s or 30s, you should have a clinical breast exam (CBE) by a health care provider every 1-3 years as part of a regular health exam.  If you are 56 or older, have a CBE every  year. Also consider having a breast X-ray (mammogram) every year.  If you have a family history of breast cancer, talk to your health care provider about genetic screening.  If you are at high risk for breast cancer, talk to your health care provider about having an MRI and a mammogram every year.  Breast cancer gene (BRCA) assessment is recommended for women who have family members with BRCA-related cancers. BRCA-related cancers include:  Breast.  Ovarian.  Tubal.  Peritoneal cancers.  Results of the assessment will determine the need for genetic counseling and BRCA1 and BRCA2 testing. Cervical Cancer Your health care provider may recommend that you be screened regularly for cancer of the pelvic organs (ovaries, uterus, and vagina). This screening involves a pelvic examination, including checking for microscopic changes to the surface of your cervix (Pap test). You may be encouraged to have this screening done every 3 years, beginning at age 10.  For women ages 28-65, health care providers may recommend pelvic exams and Pap testing every 3 years, or they may recommend the Pap and pelvic exam, combined with testing for human papilloma virus (HPV), every 5 years. Some types of HPV increase your risk of cervical cancer. Testing for HPV may also be done on women of any age with unclear Pap test results.  Other health care providers may not recommend any screening for nonpregnant women who are considered low risk for pelvic cancer and who do not have symptoms. Ask your health care provider if a screening pelvic exam is right for you.  If you have had past treatment for cervical cancer or a condition that could lead to cancer, you need Pap tests and screening for cancer for at least 20 years after your treatment. If Pap tests have been discontinued, your risk factors (such as having a new sexual partner) need to be reassessed to determine if screening should resume. Some women have medical  problems that increase the chance of getting cervical cancer. In these cases, your health care provider may recommend more frequent screening and Pap tests. Colorectal Cancer  This type of cancer can be detected and often prevented.  Routine colorectal cancer screening usually begins at 51 years of age and continues through 51 years of age.  Your health care provider may recommend screening at an earlier age if you have risk factors for colon cancer.  Your health care provider may also recommend using home test kits to check for hidden blood in the stool.  A small camera at the end of a tube can be used to examine your colon directly (sigmoidoscopy or colonoscopy). This is done to check for the earliest forms of colorectal cancer.  Routine screening usually begins at age 27.  Direct examination of the colon should be repeated every 5-10 years through 51 years of age. However, you may need to  be screened more often if early forms of precancerous polyps or small growths are found. Skin Cancer  Check your skin from head to toe regularly.  Tell your health care provider about any new moles or changes in moles, especially if there is a change in a mole's shape or color.  Also tell your health care provider if you have a mole that is larger than the size of a pencil eraser.  Always use sunscreen. Apply sunscreen liberally and repeatedly throughout the day.  Protect yourself by wearing long sleeves, pants, a wide-brimmed hat, and sunglasses whenever you are outside. HEART DISEASE, DIABETES, AND HIGH BLOOD PRESSURE   High blood pressure causes heart disease and increases the risk of stroke. High blood pressure is more likely to develop in:  People who have blood pressure in the high end of the normal range (130-139/85-89 mm Hg).  People who are overweight or obese.  People who are African American.  If you are 73-22 years of age, have your blood pressure checked every 3-5 years. If you  are 56 years of age or older, have your blood pressure checked every year. You should have your blood pressure measured twice--once when you are at a hospital or clinic, and once when you are not at a hospital or clinic. Record the average of the two measurements. To check your blood pressure when you are not at a hospital or clinic, you can use:  An automated blood pressure machine at a pharmacy.  A home blood pressure monitor.  If you are between 57 years and 25 years old, ask your health care provider if you should take aspirin to prevent strokes.  Have regular diabetes screenings. This involves taking a blood sample to check your fasting blood sugar level.  If you are at a normal weight and have a low risk for diabetes, have this test once every three years after 51 years of age.  If you are overweight and have a high risk for diabetes, consider being tested at a younger age or more often. PREVENTING INFECTION  Hepatitis B  If you have a higher risk for hepatitis B, you should be screened for this virus. You are considered at high risk for hepatitis B if:  You were born in a country where hepatitis B is common. Ask your health care provider which countries are considered high risk.  Your parents were born in a high-risk country, and you have not been immunized against hepatitis B (hepatitis B vaccine).  You have HIV or AIDS.  You use needles to inject street drugs.  You live with someone who has hepatitis B.  You have had sex with someone who has hepatitis B.  You get hemodialysis treatment.  You take certain medicines for conditions, including cancer, organ transplantation, and autoimmune conditions. Hepatitis C  Blood testing is recommended for:  Everyone born from [birth year] through 1967.  Anyone with known risk factors for hepatitis C. Sexually transmitted infections (STIs)  You should be screened for sexually transmitted infections (STIs) including gonorrhea and chlamydia  if:  You are sexually active and are younger than 51 years of age.  You are older than 51 years of age and your health care provider tells you that you are at risk for this type of infection.  Your sexual activity has changed since you were last screened and you are at an increased risk for chlamydia or gonorrhea. Ask your health care provider if you are at risk.  If you  do not have HIV, but are at risk, it may be recommended that you take a prescription medicine daily to prevent HIV infection. This is called pre-exposure prophylaxis (PrEP). You are considered at risk if:  You are sexually active and do not regularly use condoms or know the HIV status of your partner(s).  You take drugs by injection.  You are sexually active with a partner who has HIV. Talk with your health care provider about whether you are at high risk of being infected with HIV. If you choose to begin PrEP, you should first be tested for HIV. You should then be tested every 3 months for as long as you are taking PrEP.  PREGNANCY   If you are premenopausal and you may become pregnant, ask your health care provider about preconception counseling.  If you may become pregnant, take 400 to 800 micrograms (mcg) of folic acid every day.  If you want to prevent pregnancy, talk to your health care provider about birth control (contraception). OSTEOPOROSIS AND MENOPAUSE   Osteoporosis is a disease in which the bones lose minerals and strength with aging. This can result in serious bone fractures. Your risk for osteoporosis can be identified using a bone density scan.  If you are 40 years of age or older, or if you are at risk for osteoporosis and fractures, ask your health care provider if you should be screened.  Ask your health care provider whether you should take a calcium or vitamin D supplement to lower your risk for osteoporosis.  Menopause may have certain physical symptoms and risks.  Hormone replacement therapy  may reduce some of these symptoms and risks. Talk to your health care provider about whether hormone replacement therapy is right for you.  HOME CARE INSTRUCTIONS   Schedule regular health, dental, and eye exams.  Stay current with your immunizations.   Do not use any tobacco products including cigarettes, chewing tobacco, or electronic cigarettes.  If you are pregnant, do not drink alcohol.  If you are breastfeeding, limit how much and how often you drink alcohol.  Limit alcohol intake to no more than 1 drink per day for nonpregnant women. One drink equals 12 ounces of beer, 5 ounces of wine, or 1 ounces of hard liquor.  Do not use street drugs.  Do not share needles.  Ask your health care provider for help if you need support or information about quitting drugs.  Tell your health care provider if you often feel depressed.  Tell your health care provider if you have ever been abused or do not feel safe at home.   This information is not intended to replace advice given to you by your health care provider. Make sure you discuss any questions you have with your health care provider.   Document Released: 04/09/2011 Document Revised: 10/15/2014 Document Reviewed: 08/26/2013 Elsevier Interactive Patient Education [employer].  Colonoscopy A colonoscopy is an exam to look at the entire large intestine (colon). This exam can help find problems such as tumors, polyps, inflammation, and areas of bleeding. The exam takes about 1 hour.  LET [facility] CARE PROVIDER KNOW ABOUT:   Any allergies you have.  All medicines you are taking, including vitamins, herbs, eye drops, creams, and over-the-counter medicines.  Previous problems you or members of your family have had with the use of anesthetics.  Any blood disorders you have.  Previous surgeries you have had.  Medical conditions you have. RISKS AND COMPLICATIONS  Generally, this  is a safe procedure. However, as with any  procedure, complications can occur. Possible complications include:  Bleeding.  Tearing or rupture of the colon wall.  Reaction to medicines given during the exam.  Infection (rare). BEFORE THE PROCEDURE   Ask your health care provider about changing or stopping your regular medicines.  You may be prescribed an oral bowel prep. This involves drinking a large amount of medicated liquid, starting the day before your procedure. The liquid will cause you to have multiple loose stools until your stool is almost clear or light green. This cleans out your colon in preparation for the procedure.  Do not eat or drink anything else once you have started the bowel prep, unless your health care provider tells you it is safe to do so.  Arrange for someone to drive you home after the procedure. PROCEDURE   You will be given medicine to help you relax (sedative).  You will lie on your side with your knees bent.  A long, flexible tube with a light and camera on the end (colonoscope) will be inserted through the rectum and into the colon. The camera sends video back to a computer screen as it moves through the colon. The colonoscope also releases carbon dioxide gas to inflate the colon. This helps your health care provider see the area better.  During the exam, your health care provider may take a small tissue sample (biopsy) to be examined under a microscope if any abnormalities are found.  The exam is finished when the entire colon has been viewed. AFTER THE PROCEDURE   Do not drive for 24 hours after the exam.  You may have a small amount of blood in your stool.  You may pass moderate amounts of gas and have mild abdominal cramping or bloating. This is caused by the gas used to inflate your colon during the exam.  Ask when your test results will be ready and how you will get your results. Make sure you get your test results.   This information is not intended to replace advice given to you  by your health care provider. Make sure you discuss any questions you have with your health care provider.   Document Released: 09/21/2000 Document Revised: 07/15/2013 Document Reviewed: 06/01/2013 Elsevier Interactive Patient Education [employer].     Why follow it? Research shows. . Those who follow the Mediterranean diet have a reduced risk of heart disease  . The diet is associated with a reduced incidence of Parkinson's and Alzheimer's diseases . People following the diet may have longer life expectancies and lower rates of chronic diseases  . The Dietary Guidelines for Americans recommends the Mediterranean diet as an eating plan to promote health and prevent disease  What Is the Mediterranean Diet?  . Healthy eating plan based on typical foods and recipes of Mediterranean-style cooking . The diet is primarily a plant based diet; these foods should make up a majority of meals   Starches - Plant based foods should make up a majority of meals - They are an important sources of vitamins, minerals, energy, antioxidants, and fiber - Choose whole grains, foods high in fiber and minimally processed items  - Typical grain sources include wheat, oats, barley, corn, brown rice, bulgar, farro, millet, polenta, couscous  - Various types of beans include chickpeas, lentils, fava beans, black beans, white beans   Fruits  Veggies - Large quantities of antioxidant rich fruits & veggies; 6 or more servings  -  Vegetables can be eaten raw or lightly drizzled with oil and cooked  - Vegetables common to the traditional Mediterranean Diet include: artichokes, arugula, beets, broccoli, brussel sprouts, cabbage, carrots, celery, collard greens, cucumbers, eggplant, kale, leeks, lemons, lettuce, mushrooms, okra, onions, peas, peppers, potatoes, pumpkin, radishes, rutabaga, shallots, spinach, sweet potatoes, turnips, zucchini - Fruits common to the Mediterranean Diet include: apples, apricots, avocados,  cherries, clementines, dates, figs, grapefruits, grapes, melons, nectarines, oranges, peaches, pears, pomegranates, strawberries, tangerines  Fats - Replace butter and margarine with healthy oils, such as olive oil, canola oil, and tahini  - Limit nuts to no more than a handful a day  - Nuts include walnuts, almonds, pecans, pistachios, pine nuts  - Limit or avoid candied, honey roasted or heavily salted nuts - Olives are central to the [employer] - can be eaten whole or used in a variety of dishes   Meats Protein - Limiting red meat: no more than a few times a month - When eating red meat: choose lean cuts and keep the portion to the size of deck of cards - Eggs: approx. 0 to 4 times a week  - Fish and lean poultry: at least 2 a week  - Healthy protein sources include, chicken, [place], lean beef, lamb - Increase intake of seafood such as tuna, salmon, trout, mackerel, shrimp, scallops - Avoid or limit high fat processed meats such as sausage and bacon  Dairy - Include moderate amounts of low fat dairy products  - Focus on healthy dairy such as fat free yogurt, skim milk, low or reduced fat cheese - Limit dairy products higher in fat such as whole or 2% milk, cheese, ice cream  Alcohol - Moderate amounts of red wine is ok  - No more than 5 oz daily for women (all ages) and men older than age 45  - No more than 10 oz of wine daily for men younger than 56  Other - Limit sweets and other desserts  - Use herbs and spices instead of salt to flavor foods  - Herbs and spices common to the traditional Mediterranean Diet include: basil, bay leaves, chives, cloves, cumin, fennel, garlic, lavender, marjoram, mint, oregano, parsley, pepper, rosemary, sage, savory, sumac, tarragon, thyme   It's not just a diet, it's a lifestyle:  . The Mediterranean diet includes lifestyle factors typical of those in the region  . Foods, drinks and meals are best eaten with others and savored . Daily physical  activity is important for overall good health . This could be strenuous exercise like running and aerobics . This could also be more leisurely activities such as walking, housework, yard-work, or taking the stairs . Moderation is the key; a balanced and healthy diet accommodates most foods and drinks . Consider portion sizes and frequency of consumption of certain foods   Meal Ideas & Options:  . Breakfast:  o Whole wheat toast or whole wheat English muffins with peanut butter & hard boiled egg o Steel cut oats topped with apples & cinnamon and skim milk  o Fresh fruit: banana, strawberries, melon, berries, peaches  o Smoothies: strawberries, bananas, greek yogurt, peanut butter o Low fat greek yogurt with blueberries and granola  o Egg white omelet with spinach and mushrooms o Breakfast couscous: whole wheat couscous, apricots, skim milk, cranberries  . Sandwiches:  o Hummus and grilled vegetables (peppers, zucchini, squash) on whole wheat bread   o Grilled chicken on whole wheat pita with lettuce, tomatoes, cucumbers or tzatziki  o Tuna salad on whole wheat bread: tuna salad made with greek yogurt, olives, red peppers, capers, green onions o Garlic rosemary lamb pita: lamb sauted with garlic, rosemary, salt & pepper; add lettuce, cucumber, greek yogurt to pita - flavor with lemon juice and black pepper  . Seafood:  o Mediterranean grilled salmon, seasoned with garlic, basil, parsley, lemon juice and black pepper o Shrimp, lemon, and spinach whole-grain pasta salad made with low fat greek yogurt  o Seared scallops with lemon orzo  o Seared tuna steaks seasoned salt, pepper, coriander topped with tomato mixture of olives, tomatoes, olive oil, minced garlic, parsley, green onions and cappers  . Meats:  o Herbed greek chicken salad with kalamata olives, cucumber, feta  o Red bell peppers stuffed with spinach, bulgur, lean ground beef (or lentils) & topped with feta   o Kebabs: skewers of  chicken, tomatoes, onions, zucchini, squash  o [place] burgers: made with red onions, mint, dill, lemon juice, feta cheese topped with roasted red peppers . Vegetarian o Cucumber salad: cucumbers, artichoke hearts, celery, red onion, feta cheese, tossed in olive oil & lemon juice  o Hummus and whole grain pita points with a greek salad (lettuce, tomato, feta, olives, cucumbers, red onion) o Lentil soup with celery, carrots made with vegetable broth, garlic, salt and pepper  o Tabouli salad: parsley, bulgur, mint, scallions, cucumbers, tomato, radishes, lemon juice, olive oil, salt and pepper.

## 2015-07-18 NOTE — Assessment & Plan Note (Signed)
Recheck vitamin D after repletion doses last year. Also reviewed intake of fatty fishes.

## 2015-07-18 NOTE — Assessment & Plan Note (Signed)
Recheck TSH. Denies typical symptoms of sub or supra therapeutic dosing.

## 2015-07-18 NOTE — Assessment & Plan Note (Signed)
Still interested in quitting and reports she will schedule a follow up to discuss interventions, though she has tried wellbutrin, chantix, and NRT.

## 2015-07-19 ENCOUNTER — Telehealth: Payer: Self-pay | Admitting: *Deleted

## 2015-07-19 ENCOUNTER — Telehealth: Payer: Self-pay | Admitting: Gastroenterology

## 2015-07-19 ENCOUNTER — Encounter: Payer: Self-pay | Admitting: Family Medicine

## 2015-07-19 DIAGNOSIS — E7800 Pure hypercholesterolemia, unspecified: Secondary | ICD-10-CM | POA: Insufficient documentation

## 2015-07-19 LAB — TSH: TSH: 5.663 u[IU]/mL — ABNORMAL HIGH (ref 0.350–4.500)

## 2015-07-19 LAB — VITAMIN D 25 HYDROXY (VIT D DEFICIENCY, FRACTURES): Vit D, 25-Hydroxy: 27 ng/mL — ABNORMAL LOW (ref 30–100)

## 2015-07-19 MED ORDER — VITAMIN D (ERGOCALCIFEROL) 1.25 MG (50000 UNIT) PO CAPS: 50000.0000 [IU] | ORAL_CAPSULE | ORAL | Status: DC

## 2015-07-19 NOTE — Telephone Encounter (Signed)
Completed and returned form to [facility]

## 2015-07-19 NOTE — Telephone Encounter (Signed)
Prior Authorization received from [employer] for [employer].  PA form placed in provider box for completion. Derl Barrow, RN

## 2015-07-19 NOTE — Telephone Encounter (Signed)
Colonoscopy triage °

## 2015-07-20 NOTE — Telephone Encounter (Signed)
PA faxed to [facility] for reviewing.  Derl Barrow, RN

## 2015-07-21 ENCOUNTER — Telehealth: Payer: Self-pay | Admitting: Family Medicine

## 2015-07-21 MED ORDER — PRAVASTATIN SODIUM 40 MG PO TABS: 40.0000 mg | ORAL_TABLET | Freq: Every day | ORAL | Status: DC

## 2015-07-21 NOTE — Telephone Encounter (Signed)
Called pt, LM with Darryl [place] for pt to give [place] a call. If pt calls, please give her the information below or have her speak with Nehemiah Settle or April. Ottis Stain, CMA

## 2015-07-21 NOTE — Telephone Encounter (Signed)
Prior Authorization for Crestor 10 mg denied via [place].  Patient must have tried and failed two statins first.  Only one statin was listed as an allergic/intolerance to atorvastatin (Liptor) 40 mg.  Alternatives are: pravastatin, simvastatin or lovastatin. Derl Barrow, RN

## 2015-07-21 NOTE — Telephone Encounter (Signed)
Received communication from [employer] [place] of rejection of request for crestor generic. Use of this requires failure of 2 (not just lipitor as in her case) statins. I am concerned for drug interactions with simvastatin and will therefore try pravastatin for a month a follow up with her concerning myalgias.

## 2015-07-21 NOTE — Telephone Encounter (Signed)
Pt is returning Shari's call. I read her Dr. Hadley Pen message. She is going to pick up the Pravastatin. Please call patient since she might have additional questions. jw

## 2015-07-22 ENCOUNTER — Ambulatory Visit: Payer: No Typology Code available for payment source | Admitting: Pharmacist

## 2015-07-22 ENCOUNTER — Other Ambulatory Visit: Payer: Self-pay | Admitting: Family Medicine

## 2015-07-22 NOTE — Telephone Encounter (Signed)
Left message for pt to call us back. Wanted to check to see if she had any further questions. Ottis Stain, CMA

## 2015-07-26 NOTE — Telephone Encounter (Signed)
LVM for pt to return my call.

## 2015-07-28 ENCOUNTER — Ambulatory Visit: Payer: No Typology Code available for payment source | Admitting: Pharmacist

## 2015-07-29 ENCOUNTER — Ambulatory Visit: Payer: No Typology Code available for payment source | Admitting: Family Medicine

## 2015-07-29 ENCOUNTER — Ambulatory Visit: Admission: RE | Admit: 2015-07-29 | Payer: No Typology Code available for payment source | Source: Ambulatory Visit

## 2015-08-01 ENCOUNTER — Other Ambulatory Visit (HOSPITAL_COMMUNITY)
Admission: RE | Admit: 2015-08-01 | Discharge: 2015-08-01 | Disposition: A | Discharge status: Home / Self Care | Payer: No Typology Code available for payment source | Source: Ambulatory Visit | Attending: Family Medicine | Admitting: Family Medicine

## 2015-08-01 ENCOUNTER — Ambulatory Visit (INDEPENDENT_AMBULATORY_CARE_PROVIDER_SITE_OTHER): Payer: No Typology Code available for payment source | Admitting: Family Medicine

## 2015-08-01 ENCOUNTER — Encounter: Payer: Self-pay | Admitting: Pharmacist

## 2015-08-01 ENCOUNTER — Encounter: Payer: Self-pay | Admitting: Family Medicine

## 2015-08-01 ENCOUNTER — Ambulatory Visit (INDEPENDENT_AMBULATORY_CARE_PROVIDER_SITE_OTHER): Payer: No Typology Code available for payment source | Admitting: Pharmacist

## 2015-08-01 VITALS — BP 124/77 | HR 69 | Temp 98.0°F | Ht 63.0 in | Wt 157.0 lb

## 2015-08-01 DIAGNOSIS — N898 Other specified noninflammatory disorders of vagina: Secondary | ICD-10-CM

## 2015-08-01 DIAGNOSIS — F17200 Nicotine dependence, unspecified, uncomplicated: Secondary | ICD-10-CM

## 2015-08-01 DIAGNOSIS — Z113 Encounter for screening for infections with a predominantly sexual mode of transmission: Secondary | ICD-10-CM | POA: Insufficient documentation

## 2015-08-01 DIAGNOSIS — E039 Hypothyroidism, unspecified: Secondary | ICD-10-CM | POA: Diagnosis not present

## 2015-08-01 DIAGNOSIS — K219 Gastro-esophageal reflux disease without esophagitis: Secondary | ICD-10-CM

## 2015-08-01 LAB — POCT URINALYSIS DIPSTICK
Bilirubin, UA: NEGATIVE
GLUCOSE UA: NEGATIVE
Ketones, UA: NEGATIVE
LEUKOCYTES UA: NEGATIVE
NITRITE UA: NEGATIVE
Protein, UA: NEGATIVE
RBC UA: NEGATIVE
Spec Grav, UA: 1.02
UROBILINOGEN UA: 0.2
pH, UA: 6

## 2015-08-01 LAB — POCT WET PREP (WET MOUNT): Clue Cells Wet Prep Whiff POC: NEGATIVE

## 2015-08-01 MED ORDER — LEVOTHYROXINE SODIUM 112 MCG PO TABS: 112.0000 ug | ORAL_TABLET | Freq: Every day | ORAL | Status: DC

## 2015-08-01 MED ORDER — OMEPRAZOLE 20 MG PO CPDR: 20.0000 mg | DELAYED_RELEASE_CAPSULE | Freq: Every day | ORAL | Status: DC

## 2015-08-01 NOTE — Progress Notes (Signed)
S:    Patient arrives in good spirits, ambulating without assistance.    Presents for lung function evaluation.  Patient reports breathing has been worsening with exertion - walking to the mailbox or playing with children.  She is not currently diagnosed with COPD, but endorses 36 year smoking history of 0.5 - 2 packs per day.  She has attempted smoking cessation in the past, but no attempts have lasted more than a week.  She has used bupropion, varanicline, and patches before.  She is interested in smoking cessation today with plans to quit in the next 2 weeks.  O: mMRC score= 2 (endorses having to stop or slow down when walking with others and shortness of breath walking up hills)  See "scanned report" or Documentation Flowsheet (discrete results - PFTs) for  Spirometry results. Patient provided good effort while attempting spirometry.  FEV1 = 2.53 (99% predicted) FVC = 4.11 (134% predicted) FEV1/FVC ration = 0.62 (undetermined)  Lung Age = 48  A/P: Spirometry evaluation with Pre-Bronchodilator reveals near normal lung function.  Patient has been experiencing shortness of breath with exertion for few months and is not currently diagnosed with COPD nor receiving treatment.  Patient encouraged to increase exercise to improve shortness of breath and anxiety symptoms as well as tobacco cessation. Reviewed results of pulmonary function tests.  Pt verbalized understanding of results and education.   Moderate longstanding tobacco abuse in a atient expressing interest in smoking cessation.  Plans to call 1-800-QUIT-NOW for counseling help.  Patient encouraged to return to clinic in 3 weeks if unable to quit smoking on her own for assessment of pharmacotherapy options including combination therapy of patches and gum/lozenge.   Written pt instructions provided.  F/U Clinic visit 3 weeks if unsuccessful with quit attempt.   Total time in face to face counseling 20 minutes.  Patient seen with Viann Fish,  PharmD candidate.

## 2015-08-01 NOTE — Assessment & Plan Note (Signed)
Wet prep negative. Urinalysis negative. Suspect physiologic discharge. Discussed regular hygiene without feminine care products. Will call with results from GC/Chl though feel this is unlikely to be positive.

## 2015-08-01 NOTE — Progress Notes (Signed)
Patient ID: Helen Jones, female   DOB: 05-26-1966, 49 y.o.   MRN: [id number] Reviewed: Agree with Dr. Graylin Shiver documentation and management.

## 2015-08-01 NOTE — Patient Instructions (Signed)
-   Stop taking synthroid 180mcg, and start taking the slightly higher dose, 162mcg, every day. Please schedule a follow up visit in 6 - 8 weeks to have your level rechecked or call us at [phone] if you experience any new symptoms especially chest pain/shortness of breath.   - I have refilled your GERD medication  - We will discuss the results of these tests from today later.

## 2015-08-01 NOTE — Assessment & Plan Note (Signed)
Symptoms worse when off PPI. Cautioned pt on SEs of long term use. Will continue for now.

## 2015-08-01 NOTE — Assessment & Plan Note (Addendum)
Increase synthroid to 141mcg. She is strongly cautioned to stop this if she develops any angina. Recheck 6 - 8 weeks.

## 2015-08-01 NOTE — Assessment & Plan Note (Signed)
Spirometry evaluation with Pre-Bronchodilator reveals near normal lung function.  Patient has been experiencing shortness of breath with exertion for few months and is not currently diagnosed with COPD nor receiving treatment.  Patient encouraged to increase exercise to improve shortness of breath and anxiety symptoms as well as tobacco cessation. Reviewed results of pulmonary function tests.  Pt verbalized understanding of results and education.   Moderate longstanding tobacco abuse in a atient expressing interest in smoking cessation.  Plans to call 1-800-QUIT-NOW for counseling help.  Patient encouraged to return to clinic in 3 weeks if unable to quit smoking on her own for assessment of pharmacotherapy options including combination therapy of patches and gum/lozenge.   Written pt instructions provided.  F/U Clinic visit 3 weeks if unsuccessful with quit attempt.

## 2015-08-01 NOTE — Progress Notes (Signed)
Subjective: Helen Jones is a 49 y.o. female presenting for vaginal discharge.  Reports dark vaginal discharge with odor and occasional sharp pains in the lower abdomen. Some dysuria and 2 - 3 episodes of nocturia/night, no fever, nobleeding, in monogamous relationship with a female, last intercourse 3 weeks ago, no change in symptoms before/after. No douching. LMP "years ago" and denies bleeding.   - Taking vitamin D, pravastatin (denies soreness). Reports missing absolutely no doses of synthroid and no changes in symptoms. - Smoker: will schedule another appt to discuss cessation.   Objective: BP 124/77 mmHg  Pulse 69  Temp(Src) 98 F (36.7 C)  Ht 5\' 3"  (1.6 m)  Wt 157 lb (71.215 kg)  BMI 27.82 kg/m2 Gen: Well-appearing 49 y.o. female in no distress. Skin: No rash on palms or soles.  Pelvic: External genitalia within normal limits. Vaginal mucosa pink, moist, atrophic rugae.  Nonfriable cervix without lesions, no bleeding noted. Scant white/gray malodorous discharge noted.  Bimanual exam revealed normal, nongravid uterus. No cervical motion tenderness. No adnexal masses bilaterally. No inguinal lymphadenopathy.   Jazmin Hartsell, CMA present throughout duration of exam.   TSH 5.663* 07/18/2015   Assessment/Plan: Helen Jones is a 49 y.o. female here for vaginal discharge. GERD Symptoms worse when off PPI. Cautioned pt on SEs of long term use. Will continue for now.   Hypothyroidism Increase synthroid to 119mcg. She is strongly cautioned to stop this if she develops any angina. Recheck 6 - 8 weeks.   Vaginal discharge Wet prep negative. Urinalysis negative. Suspect physiologic discharge. Discussed regular hygiene without feminine care products. Will call with results from GC/Chl though feel this is unlikely to be positive.

## 2015-08-01 NOTE — Patient Instructions (Signed)
Great to see you today.    The lung function test showed good lung function.   Please work on quitting smoking in the next few weeks.   Please use the 1800-quit-now support to help you.   Follow up in 3 weeks with the pharmacy clinic if you need more assistance with medication to help you quit.

## 2015-08-02 LAB — GC/CHLAMYDIA PROBE AMP (~~LOC~~) NOT AT ARMC
CHLAMYDIA, DNA PROBE: NEGATIVE
Neisseria Gonorrhea: NEGATIVE

## 2015-08-04 ENCOUNTER — Telehealth: Payer: Self-pay

## 2015-08-04 ENCOUNTER — Other Ambulatory Visit: Payer: Self-pay

## 2015-08-04 NOTE — Telephone Encounter (Signed)
Gastroenterology Pre-Procedure Review  Request Date: 09/27/15 Requesting Physician: Dr. Vance Gather   PATIENT REVIEW QUESTIONS: The patient responded to the following health history questions as indicated:    1. Are you having any GI issues? yes (Constipation) 2. Do you have a personal history of Polyps? no 3. Do you have a family history of Colon Cancer or Polyps? yes (mother polyps) 4. Diabetes Mellitus? no 5. Joint replacements in the past 12 months?no 6. Major health problems in the past 3 months?no 7. Any artificial heart valves, MVP, or defibrillator?no    MEDICATIONS & ALLERGIES:    Patient reports the following regarding taking any anticoagulation/antiplatelet therapy:   Plavix, Coumadin, Eliquis, Xarelto, Lovenox, Pradaxa, Brilinta, or Effient? no Aspirin? yes (BC Powder)  Patient confirms/reports the following medications:  Current Outpatient Prescriptions  Medication Sig Dispense Refill  . fexofenadine (ALLEGRA) 180 MG tablet Take 1 tablet (180 mg total) by mouth daily. 90 tablet 3  . fluticasone (FLONASE) 50 MCG/ACT nasal spray Place 2 sprays into both nostrils daily. 16 g 6  . levothyroxine (SYNTHROID, LEVOTHROID) 112 MCG tablet Take 1 tablet (112 mcg total) by mouth daily. 90 tablet 0  . omeprazole (PRILOSEC) 20 MG capsule Take 1 capsule (20 mg total) by mouth daily. 30 capsule 5  . pravastatin (PRAVACHOL) 40 MG tablet Take 1 tablet (40 mg total) by mouth daily. 30 tablet 0  . Vitamin D, Ergocalciferol, (DRISDOL) 50000 UNITS CAPS capsule Take 1 capsule (50,000 Units total) by mouth every 7 (seven) days. 12 capsule 0   No current facility-administered medications for this visit.    Patient confirms/reports the following allergies:  No Known Allergies  No orders of the defined types were placed in this encounter.    AUTHORIZATION INFORMATION Primary Insurance: 1D#: Group #:  Secondary Insurance: 1D#: Group #:  SCHEDULE INFORMATION: Date:  09/27/15 Time: Location: [facility]

## 2015-08-04 NOTE — Telephone Encounter (Signed)
Pt scheduled for colonoscopy at [facility] on 09/27/15. Instructs/rx mailed

## 2015-09-20 ENCOUNTER — Other Ambulatory Visit: Payer: Self-pay | Admitting: Family Medicine

## 2015-09-27 ENCOUNTER — Ambulatory Visit: Payer: No Typology Code available for payment source | Admitting: Certified Registered Nurse Anesthetist

## 2015-09-27 ENCOUNTER — Encounter: Payer: Self-pay | Admitting: *Deleted

## 2015-09-27 ENCOUNTER — Ambulatory Visit
Admission: RE | Admit: 2015-09-27 | Discharge: 2015-09-27 | Disposition: A | Discharge status: Home / Self Care | Payer: No Typology Code available for payment source | Source: Ambulatory Visit | Attending: Gastroenterology | Admitting: Gastroenterology

## 2015-09-27 ENCOUNTER — Encounter
Admission: RE | Disposition: A | Discharge status: Home / Self Care | Payer: Self-pay | Source: Ambulatory Visit | Attending: Gastroenterology

## 2015-09-27 DIAGNOSIS — D123 Benign neoplasm of transverse colon: Secondary | ICD-10-CM | POA: Insufficient documentation

## 2015-09-27 DIAGNOSIS — K5730 Diverticulosis of large intestine without perforation or abscess without bleeding: Secondary | ICD-10-CM | POA: Diagnosis not present

## 2015-09-27 DIAGNOSIS — D122 Benign neoplasm of ascending colon: Secondary | ICD-10-CM | POA: Insufficient documentation

## 2015-09-27 DIAGNOSIS — K641 Second degree hemorrhoids: Secondary | ICD-10-CM | POA: Insufficient documentation

## 2015-09-27 DIAGNOSIS — F17200 Nicotine dependence, unspecified, uncomplicated: Secondary | ICD-10-CM | POA: Diagnosis not present

## 2015-09-27 DIAGNOSIS — Z79899 Other long term (current) drug therapy: Secondary | ICD-10-CM | POA: Diagnosis not present

## 2015-09-27 DIAGNOSIS — D125 Benign neoplasm of sigmoid colon: Secondary | ICD-10-CM | POA: Diagnosis not present

## 2015-09-27 DIAGNOSIS — F418 Other specified anxiety disorders: Secondary | ICD-10-CM | POA: Diagnosis not present

## 2015-09-27 DIAGNOSIS — E039 Hypothyroidism, unspecified: Secondary | ICD-10-CM | POA: Insufficient documentation

## 2015-09-27 DIAGNOSIS — K219 Gastro-esophageal reflux disease without esophagitis: Secondary | ICD-10-CM | POA: Diagnosis not present

## 2015-09-27 DIAGNOSIS — Z800 Family history of malignant neoplasm of digestive organs: Secondary | ICD-10-CM | POA: Insufficient documentation

## 2015-09-27 DIAGNOSIS — Z1211 Encounter for screening for malignant neoplasm of colon: Secondary | ICD-10-CM | POA: Diagnosis present

## 2015-09-27 HISTORY — PX: COLONOSCOPY WITH PROPOFOL: SHX5780

## 2015-09-27 SURGERY — COLONOSCOPY WITH PROPOFOL
Anesthesia: General

## 2015-09-27 MED ORDER — PROPOFOL 10 MG/ML IV BOLUS
INTRAVENOUS | Status: DC | PRN
  Administered 2015-09-27 (×6): 20 mg via INTRAVENOUS

## 2015-09-27 MED ORDER — SODIUM CHLORIDE 0.9 % IV SOLN
INTRAVENOUS | Status: DC
  Administered 2015-09-27: 1000 mL via INTRAVENOUS

## 2015-09-27 MED ORDER — LACTATED RINGERS IV SOLN
INTRAVENOUS | Status: DC | PRN
  Administered 2015-09-27: 08:00:00 via INTRAVENOUS

## 2015-09-27 NOTE — Anesthesia Preprocedure Evaluation (Signed)
Anesthesia Evaluation  Patient identified by MRN, date of birth, ID band Patient awake    Reviewed: Allergy & Precautions, NPO status , Patient's Chart, lab work & pertinent test results, reviewed documented beta blocker date and time   Airway Mallampati: II  TM Distance: >3 FB     Dental  (+) Chipped   Pulmonary Current Smoker,           Cardiovascular      Neuro/Psych  Headaches, PSYCHIATRIC DISORDERS Anxiety Depression    GI/Hepatic GERD  ,  Endo/Other  Hypothyroidism   Renal/GU      Musculoskeletal  (+) Arthritis ,   Abdominal   Peds  Hematology   Anesthesia Other Findings   Reproductive/Obstetrics                             Anesthesia Physical Anesthesia Plan  ASA: II  Anesthesia Plan: General   Post-op Pain Management:    Induction: Intravenous  Airway Management Planned:   Additional Equipment:   Intra-op Plan:   Post-operative Plan:   Informed Consent: I have reviewed the patients History and Physical, chart, labs and discussed the procedure including the risks, benefits and alternatives for the proposed anesthesia with the patient or authorized representative who has indicated his/her understanding and acceptance.     Plan Discussed with: CRNA  Anesthesia Plan Comments:         Anesthesia Quick Evaluation

## 2015-09-27 NOTE — Transfer of Care (Signed)
Immediate Anesthesia Transfer of Care Note  Patient: Helen Jones  Procedure(s) Performed: Procedure(s): COLONOSCOPY WITH PROPOFOL (N/A)  Patient Location: PACU  Anesthesia Type:General  Level of Consciousness: awake, alert  and oriented  Airway & Oxygen Therapy: Patient Spontanous Breathing and Patient connected to nasal cannula oxygen  Post-op Assessment: Report given to RN and Post -op Vital signs reviewed and stable  Post vital signs: Reviewed and stable  Last Vitals:  Filed Vitals:   09/27/15 0844 09/27/15 0845  BP:  84/47  Pulse:  81  Temp: 35.9 C   Resp:  23    Complications: No apparent anesthesia complications

## 2015-09-27 NOTE — Anesthesia Postprocedure Evaluation (Signed)
Anesthesia Post Note  Patient: Helen Jones  Procedure(s) Performed: Procedure(s) (LRB): COLONOSCOPY WITH PROPOFOL (N/A)  Patient location during evaluation: Endoscopy Anesthesia Type: General Level of consciousness: awake Pain management: pain level controlled Vital Signs Assessment: post-procedure vital signs reviewed and stable Respiratory status: spontaneous breathing Cardiovascular status: blood pressure returned to baseline Anesthetic complications: no    Last Vitals:  Filed Vitals:   09/27/15 0900 09/27/15 0910  BP: 97/61 102/66  Pulse: 75 71  Temp:    Resp: 13 15    Last Pain: There were no vitals filed for this visit.               Marcianne Ozbun S

## 2015-09-27 NOTE — Op Note (Signed)
[facility] Gastroenterology Patient Name: Helen Jones Procedure Date: 09/27/2015 8:12 AM MRN: [id number] Account #: [id number] Date of Birth: 11/16/1965 Admit Type: Outpatient Age: 49 Room: [facility] ENDO ROOM 4 Gender: Female Note Status: Finalized Procedure:         Colonoscopy Indications:       Screening for colorectal malignant neoplasm Providers:         [name], MD Referring MD:      Meredith Leeds. Bonner Puna (Referring MD) Medicines:         Propofol per Anesthesia Complications:     No immediate complications. Procedure:         Pre-Anesthesia Assessment:                    - Prior to the procedure, a History and Physical was                     performed, and patient medications and allergies were                     reviewed. The patient's tolerance of previous anesthesia                     was also reviewed. The risks and benefits of the procedure                     and the sedation options and risks were discussed with the                     patient. All questions were answered, and informed consent                     was obtained. Prior Anticoagulants: The patient has taken                     no previous anticoagulant or antiplatelet agents. ASA                     Grade Assessment: II - A patient with mild systemic                     disease. After reviewing the risks and benefits, the                     patient was deemed in satisfactory condition to undergo                     the procedure.                    After obtaining informed consent, the colonoscope was                     passed under direct vision. Throughout the procedure, the                     patient's blood pressure, pulse, and oxygen saturations                     were monitored continuously. The Colonoscope was                     introduced through the anus and advanced to the the cecum,  identified by appendiceal orifice and ileocecal valve. The              colonoscopy was performed without difficulty. The patient                     tolerated the procedure well. The quality of the bowel                     preparation was excellent. Findings:      The perianal and digital rectal examinations were normal.      A 4 mm polyp was found in the ascending colon. The polyp was sessile.       The polyp was removed with a cold snare. Resection and retrieval were       complete.      A 6 mm polyp was found in the transverse colon. The polyp was sessile.       The polyp was removed with a cold snare. Resection and retrieval were       complete.      A 9 mm polyp was found in the sigmoid colon. The polyp was pedunculated.       The polyp was removed with a hot snare. Resection and retrieval were       complete. To prevent bleeding after the polypectomy, one hemostatic clip       was successfully placed (MRI compatible). There was no bleeding at the       end of the procedure.      Multiple small-mouthed diverticula were found in the sigmoid colon.      Non-bleeding internal hemorrhoids were found during retroflexion. The       hemorrhoids were Grade II (internal hemorrhoids that prolapse but reduce       spontaneously). Impression:        - One 4 mm polyp in the ascending colon. Resected and                     retrieved.                    - One 6 mm polyp in the transverse colon. Resected and                     retrieved.                    - One 9 mm polyp in the sigmoid colon. Resected and                     retrieved. MRI-compatible clip was placed.                    - Diverticulosis in the sigmoid colon.                    - Non-bleeding internal hemorrhoids. Recommendation:    - Repeat colonoscopy in 5 years if polyp adenoma and 10                     years if hyperplastic Procedure Code(s): --- Professional ---                    [id number], Colonoscopy, flexible; with removal of tumor(s),                     polyp(s), or other  lesion(s)  by snare technique Diagnosis Code(s): --- Professional ---                    Z12.11, Encounter for screening for malignant neoplasm of                     colon                    D12.2, Benign neoplasm of ascending colon                    D12.3, Benign neoplasm of transverse colon                    D12.5, Benign neoplasm of sigmoid colon CPT copyright 2014 American Medical Association. All rights reserved. The codes documented in this report are preliminary and upon coder review may  be revised to meet current compliance requirements. [name], MD 09/27/2015 8:44:26 AM This report has been signed electronically. Number of Addenda: 0 Note Initiated On: 09/27/2015 8:12 AM Scope Withdrawal Time: 0 hours 12 minutes 14 seconds  Total Procedure Duration: 0 hours 18 minutes 7 seconds       [facility]

## 2015-09-27 NOTE — H&P (Addendum)
[facility]  [address] [place], [place] [address] Phone: [phone] Fax : [phone]  Primary Care Physician:  Vance Gather, MD Primary Gastroenterologist:  Dr. Allen Norris  Pre-Procedure History & Physical: HPI:  Helen Jones is a 49 y.o. female is here for an colonoscopy.   Past Medical History  Diagnosis Date  . Depression   . GERD (gastroesophageal reflux disease)   . Hypothyroid   . Eczema   . Anxiety     Past Surgical History  Procedure Laterality Date  . Tubal ligation    . Cholecystectomy      Prior to Admission medications   Medication Sig Start Date End Date Taking? Authorizing Provider  fexofenadine (ALLEGRA) 180 MG tablet Take 1 tablet (180 mg total) by mouth daily. 02/04/15   Mariel Aloe, MD  fluticasone (FLONASE) 50 MCG/ACT nasal spray Place 2 sprays into both nostrils daily. 02/04/15   Mariel Aloe, MD  levothyroxine (SYNTHROID, LEVOTHROID) 112 MCG tablet Take 1 tablet (112 mcg total) by mouth daily. 08/01/15   Patrecia Pour, MD  omeprazole (PRILOSEC) 20 MG capsule Take 1 capsule (20 mg total) by mouth daily. 08/01/15   Patrecia Pour, MD  pravastatin (PRAVACHOL) 40 MG tablet TAKE ONE TABLET BY MOUTH ONCE DAILY 09/23/15   Patrecia Pour, MD  vitamin B-12 (CYANOCOBALAMIN) 1000 MCG tablet Take 1,000 mcg by mouth daily.    Historical Provider, MD  Vitamin D, Ergocalciferol, (DRISDOL) 50000 UNITS CAPS capsule Take 1 capsule (50,000 Units total) by mouth every 7 (seven) days. 07/19/15   Patrecia Pour, MD    Allergies as of 08/04/2015  . (No Known Allergies)    Family History  Problem Relation Age of Onset  . Stomach cancer    . Colitis    . Diabetes      Social History   Social History  . Marital Status: Divorced    Spouse Name: N/A  . Number of Children: N/A  . Years of Education: N/A   Occupational History  . Not on file.   Social History Main Topics  . Smoking status: Current Every Day Smoker -- 0.50 packs/day    Types:  Cigarettes    Start date: 10/08/1977  . Smokeless tobacco: Never Used     Comment: Previous 2 ppd smoker.    . Alcohol Use: No  . Drug Use: No  . Sexual Activity: Yes   Other Topics Concern  . Not on file   Social History Narrative   Daughter Cloyd Stagers (DOB: 2/07)  here at [facility].  Daughter Sheana Andary in custody of the daughter's father.  Complicated history.  Sister living locally.  She lives in [place].  + Tobacco.  One sexual partner for past 5 years- stable.    Review of Systems: See HPI, otherwise negative ROS  Physical Exam: There were no vitals taken for this visit. General:   Alert,  pleasant and cooperative in NAD Head:  Normocephalic and atraumatic. Neck:  Supple; no masses or thyromegaly. Lungs:  Clear throughout to auscultation.    Heart:  Regular rate and rhythm. Abdomen:  Soft, nontender and nondistended. Normal bowel sounds, without guarding, and without rebound.   Neurologic:  Alert and  oriented x4;  grossly normal neurologically.  Impression/Plan: [name] is here for an colonoscopy to be performed for screening  Risks, benefits, limitations, and alternatives regarding  colonoscopy have been reviewed with the patient.  Questions have been answered.  All  parties agreeable.   Ollen Bowl, MD  09/27/2015, 7:44 AM

## 2015-09-28 LAB — SURGICAL PATHOLOGY

## 2015-09-29 ENCOUNTER — Encounter: Payer: Self-pay | Admitting: Gastroenterology

## 2015-09-30 ENCOUNTER — Encounter: Payer: No Typology Code available for payment source | Admitting: Gastroenterology

## 2015-10-04 NOTE — Addendum Note (Signed)
Addendum  created 10/04/15 [id number] by Gunnar Bulla, MD   Modules edited: Anesthesia Attestations

## 2015-10-28 ENCOUNTER — Other Ambulatory Visit: Payer: Self-pay | Admitting: Family Medicine

## 2015-10-31 NOTE — Telephone Encounter (Signed)
Can we please get her scheduled for a follow up? I have refilled her synthroid but she needs labs. Thank you!

## 2015-11-03 NOTE — Telephone Encounter (Signed)
LVM for pt to call back to inform of below and to see about getting her an appointment. Katharina Caper, Ahnaf Caponi D, [place]

## 2015-11-09 NOTE — Telephone Encounter (Signed)
Pt has appointment scheduled. Katharina Caper, Helen Jones, [place]

## 2015-11-21 ENCOUNTER — Other Ambulatory Visit: Payer: Self-pay | Admitting: Family Medicine

## 2015-11-21 NOTE — Telephone Encounter (Signed)
Pt called because she was having trouble getting her Synthroid. She has switched pharmacy's because of her insurance. [facility] is using [facility] [address] . Please call this in since she needs this. jw

## 2015-11-22 MED ORDER — LEVOTHYROXINE SODIUM 112 MCG PO TABS: 112.0000 ug | ORAL_TABLET | Freq: Every day | ORAL | Status: DC

## 2015-11-24 ENCOUNTER — Other Ambulatory Visit: Payer: Self-pay | Admitting: Family Medicine

## 2015-11-25 ENCOUNTER — Ambulatory Visit: Payer: No Typology Code available for payment source | Admitting: Family Medicine

## 2015-12-16 ENCOUNTER — Ambulatory Visit: Payer: No Typology Code available for payment source | Admitting: Family Medicine

## 2016-02-23 ENCOUNTER — Ambulatory Visit (INDEPENDENT_AMBULATORY_CARE_PROVIDER_SITE_OTHER): Payer: Medicaid Other | Admitting: Family Medicine

## 2016-02-23 ENCOUNTER — Encounter: Payer: Self-pay | Admitting: Family Medicine

## 2016-02-23 VITALS — BP 117/58 | HR 70 | Temp 98.4°F | Wt 157.0 lb

## 2016-02-23 DIAGNOSIS — L309 Dermatitis, unspecified: Secondary | ICD-10-CM

## 2016-02-23 DIAGNOSIS — R0981 Nasal congestion: Secondary | ICD-10-CM

## 2016-02-23 MED ORDER — HYDROCORTISONE 1 % EX CREA: 1.0000 "application " | TOPICAL_CREAM | Freq: Two times a day (BID) | CUTANEOUS | Status: DC

## 2016-02-23 NOTE — Patient Instructions (Signed)
Thank you for coming in,   Most likely you are having a viral illness.   You can try afrin to help with nasal congestion but this can only be used for three days.   You can try an antihistamine/decongestant such as zyrtec-D or allegra-D but these medications cannot be used with zyrtec or allegra.   You can try a netti pot or nasal saline drops as well.   Please follow up with [place] if you develop a fever.   Please bring all of your medications with you to each visit.   Health maintenance items that are due.  Health Maintenance  Topic Date Due  . Mammogram  08/26/2014  . Flu Shot  05/08/2016  . Pap Smear  12/25/2016  . Colon Cancer Screening  09/26/2020  . Tetanus Vaccine  02/10/2024  .  Hepatitis C: One time screening is recommended by Center for Disease Control  (CDC) for  adults born from [birth year] through 1967.   Completed  . HIV Screening  Completed     Sign up for My Chart to have easy access to your labs results, and communication with your Primary care physician   Please feel free to call with any questions or concerns at any time, at [phone]. --Dr. Loura Halt

## 2016-02-23 NOTE — Progress Notes (Signed)
   Subjective:    Patient ID: Helen Jones, female    DOB: November 05, 1965, 50 y.o.   MRN: [id number]  Seen for Same day visit for   CC: nasal congestion   She has had a head cold.  Has ear crushing and has muffled hearing.  Symptoms have been occurring for about a week.  Denies any fevers.  Takes over the counter allergy medication  No one else at home  Having cough and some rhinorrhea.  Some days it is worse than others.  She uses flonase.   PMH: HLD, hypothyroidism, GERD, eczema   SH: active smoker, no alcohol.  FH: non contributory   Review of Systems   See HPI for ROS. Objective:  BP 117/58 mmHg  Pulse 70  Temp(Src) 98.4 F (36.9 C) (Oral)  Wt 157 lb (71.215 kg)  General: NAD HEENT: clear conjunctiva, dry flaky skin in the ear lobes and canal, tympanic membranes clear and intact bilaterally, MMM, uvula midline, no cervical LAD, EOMI, PERRL, erythematous turbinates, poor dentition, no tonsillar exudates, no fronts or maxillary tenderness  Cardiac: RRR, normal heart sounds, no murmurs. Respiratory: CTAB, normal effort Extremities:  WWP. Skin: warm and dry, no rashes noted     Assessment & Plan:   Nasal congestion Most likely she is having a viral illness vs allergies.  Doubtful for bacterial sinusitis.  No indication of PNA  - advised supportive care at this time.  - counseled to stop smoking   Eczema Advised she may want to apply some hydrocortisone to the external ear and small amount to the internal ear to diminish itching and irritation.  - may require a stronger steroid if no improvement.

## 2016-02-23 NOTE — Assessment & Plan Note (Signed)
Most likely she is having a viral illness vs allergies.  Doubtful for bacterial sinusitis.  No indication of PNA  - advised supportive care at this time.  - counseled to stop smoking

## 2016-02-23 NOTE — Assessment & Plan Note (Signed)
Advised she may want to apply some hydrocortisone to the external ear and small amount to the internal ear to diminish itching and irritation.  - may require a stronger steroid if no improvement.

## 2016-03-22 ENCOUNTER — Other Ambulatory Visit: Payer: Self-pay | Admitting: Family Medicine

## 2016-09-06 ENCOUNTER — Other Ambulatory Visit: Payer: Self-pay | Admitting: *Deleted

## 2016-09-06 MED ORDER — LEVOTHYROXINE SODIUM 112 MCG PO TABS: 112.0000 ug | ORAL_TABLET | Freq: Every day | ORAL | 2 refills | Status: DC

## 2016-09-18 ENCOUNTER — Other Ambulatory Visit: Payer: Self-pay | Admitting: *Deleted

## 2016-09-18 DIAGNOSIS — K219 Gastro-esophageal reflux disease without esophagitis: Secondary | ICD-10-CM

## 2016-09-18 MED ORDER — OMEPRAZOLE 20 MG PO CPDR: 20.0000 mg | DELAYED_RELEASE_CAPSULE | Freq: Every day | ORAL | 4 refills | Status: DC

## 2017-01-10 ENCOUNTER — Other Ambulatory Visit: Payer: Self-pay | Admitting: Student

## 2017-01-10 DIAGNOSIS — K219 Gastro-esophageal reflux disease without esophagitis: Secondary | ICD-10-CM

## 2017-04-13 ENCOUNTER — Other Ambulatory Visit: Payer: Self-pay | Admitting: Student

## 2017-05-16 ENCOUNTER — Other Ambulatory Visit: Payer: Self-pay | Admitting: *Deleted

## 2017-05-16 ENCOUNTER — Other Ambulatory Visit: Payer: Self-pay | Admitting: Student

## 2017-05-16 DIAGNOSIS — K219 Gastro-esophageal reflux disease without esophagitis: Secondary | ICD-10-CM

## 2017-05-16 MED ORDER — OMEPRAZOLE 20 MG PO CPDR: 20.0000 mg | DELAYED_RELEASE_CAPSULE | Freq: Every day | ORAL | 0 refills | Status: DC

## 2017-05-16 NOTE — Telephone Encounter (Signed)
[facility]ep[facility] 20 mg caps. Hubbard Hartshorn, RN, BSN

## 2017-05-16 NOTE — Telephone Encounter (Signed)
She needs office visit. Over a year since she was here. Thanks!

## 2017-05-22 NOTE — Telephone Encounter (Signed)
Attempted to reach patient to schedule appt for med refills. Left message on VM requesting return call.  Hubbard Hartshorn, RN, BSN

## 2017-05-23 MED ORDER — PRAVASTATIN SODIUM 40 MG PO TABS: 40.0000 mg | ORAL_TABLET | Freq: Every day | ORAL | 0 refills | Status: DC

## 2017-05-23 NOTE — Telephone Encounter (Signed)
Patient scheduled with PCP for 8/24 would like enough cholesterol medication to last until appointment [facility]

## 2017-05-31 ENCOUNTER — Encounter: Payer: Self-pay | Admitting: Student

## 2017-05-31 ENCOUNTER — Ambulatory Visit (INDEPENDENT_AMBULATORY_CARE_PROVIDER_SITE_OTHER): Payer: BLUE CROSS/BLUE SHIELD | Admitting: Student

## 2017-05-31 VITALS — BP 122/80 | HR 74 | Temp 98.1°F | Ht 63.0 in | Wt 159.0 lb

## 2017-05-31 DIAGNOSIS — J309 Allergic rhinitis, unspecified: Secondary | ICD-10-CM

## 2017-05-31 DIAGNOSIS — K219 Gastro-esophageal reflux disease without esophagitis: Secondary | ICD-10-CM

## 2017-05-31 DIAGNOSIS — R5383 Other fatigue: Secondary | ICD-10-CM | POA: Diagnosis not present

## 2017-05-31 DIAGNOSIS — L409 Psoriasis, unspecified: Secondary | ICD-10-CM | POA: Diagnosis not present

## 2017-05-31 DIAGNOSIS — H538 Other visual disturbances: Secondary | ICD-10-CM

## 2017-05-31 MED ORDER — FLUTICASONE PROPIONATE 50 MCG/ACT NA SUSP: 2.0000 | Freq: Every day | NASAL | 6 refills | Status: DC

## 2017-05-31 MED ORDER — OLOPATADINE HCL 0.2 % OP SOLN: 1.0000 [drp] | Freq: Every day | OPHTHALMIC | 1 refills | Status: DC

## 2017-05-31 NOTE — Assessment & Plan Note (Signed)
She also has hypertrophied, dry and scaly eyelids bilaterally.  - fluticasone (FLONASE) 50 MCG/ACT nasal spray; Place 2 sprays into both nostrils daily.  Dispense: 16 g; Refill: 6 -discussed about appropriate use

## 2017-05-31 NOTE — Assessment & Plan Note (Signed)
-  Ambulatory referral to dermatologist.

## 2017-05-31 NOTE — Patient Instructions (Signed)
It was great seeing you today! We have addressed the following issues today 1. Fatigue: please return to clinic on Monday for blood work 2.   Vision change: recommend you call your eye doctor and follow up with them 3.   Chest pain: take your acid reflux medication. See below for more information.   If we did any lab work today, and the results require attention, either me or my nurse will get in touch with you. If everything is normal, you will get a letter in mail and a message via . If you don't hear from [place] in two weeks, please give [place] a call. Otherwise, we look forward to seeing you again at your next visit. If you have any questions or concerns before then, please call the clinic at [phone].  Please bring all your medications to every doctors visit  Sign up for My Chart to have easy access to your labs results, and communication with your Primary care physician.    Please check-out at the front desk before leaving the clinic.    Take Care,   Dr. Cyndia Skeeters   Food Choices for Gastroesophageal Reflux Disease, Adult When you have gastroesophageal reflux disease (GERD), the foods you eat and your eating habits are very important. Choosing the right foods can help ease your discomfort. What guidelines do I need to follow?  Choose fruits, vegetables, whole grains, and low-fat dairy products.  Choose low-fat meat, fish, and poultry.  Limit fats such as oils, salad dressings, butter, nuts, and avocado.  Keep a food diary. This helps you identify foods that cause symptoms.  Avoid foods that cause symptoms. These may be different for everyone.  Eat small meals often instead of 3 large meals a day.  Eat your meals slowly, in a place where you are relaxed.  Limit fried foods.  Cook foods using methods other than frying.  Avoid drinking alcohol.  Avoid drinking large amounts of liquids with your meals.  Avoid bending over or lying down until 2-3 hours after eating. What  foods are not recommended? These are some foods and drinks that may make your symptoms worse: Vegetables Tomatoes. Tomato juice. Tomato and spaghetti sauce. Chili peppers. Onion and garlic. Horseradish. Fruits Oranges, grapefruit, and lemon (fruit and juice). Meats High-fat meats, fish, and poultry. This includes hot dogs, ribs, ham, sausage, salami, and bacon. Dairy Whole milk and chocolate milk. Sour cream. Cream. Butter. Ice cream. Cream cheese. Drinks Coffee and tea. Bubbly (carbonated) drinks or energy drinks. Condiments Hot sauce. Barbecue sauce. Sweets/Desserts Chocolate and cocoa. Donuts. Peppermint and spearmint. Fats and Oils High-fat foods. This includes [place] fries and potato chips. Other Vinegar. Strong spices. This includes black pepper, white pepper, red pepper, cayenne, curry powder, cloves, ginger, and chili powder. The items listed above may not be a complete list of foods and drinks to avoid. Contact your dietitian for more information. This information is not intended to replace advice given to you by your health care provider. Make sure you discuss any questions you have with your health care provider. Document Released: 03/25/2012 Document Revised: 03/01/2016 Document Reviewed: 07/29/2013 Elsevier Interactive Patient Education  2017 [employer].

## 2017-05-31 NOTE — Progress Notes (Signed)
Subjective:    Helen Jones is a 51 y.o. old female here to meet new PCP. She also has a concern about blurry vision, chest pain and fatigue.  HPI Blurry vision: this has been going on for 6 months. Is intermittent. Happens a lot at work. Denies eye pain. Reports itching in her eyes. Blurry vision is getting worse. No history of trauma or surgery. She has an opthalmologist.   Chest pain: this has been going on for 8 months. Not in pain currently. She points at epigastric area. It feels like something is stuck there. Denies radiation.Worse when  she gets off work and when she goes to bed. Worse with heat and when she overeats.  Muccinex helped in the past. Denies shortness of breath, palpation, orthopnea, edema.    Fatigue: this has been going on for about 3-4 months. Sleeps okay. She snores at night. Feels sleepy during the day time. History of depression and anxiety in the past. She says she was on mediction 11 years ago.  Not sure about the medication she was on. Reports chest pain as above but denies shortness of breath or orthopnea. Denies cold or heat intolerance. She has history of hypothyroidism. She is on Synthroid. She is also on PPI which could decrease bioavailability of synthroid. Denies blood in stool or melena.   PMH/Problem List: has Hypothyroidism; DEPRESSION, MAJOR, RECURRENT; ANXIETY STATE NOS; TOBACCO DEPENDENCE; MIGRAINE NOS W/O INTRACTABLE MIGRAINE; GERD; HOT FLASHES; VAGINITIS, ATROPHIC, POSTMENOPAUSAL; OSTEOARTHRITIS, SACROILIAC JOINT; Overweight; Eczema; Allergic rhinitis; Vitamin D deficiency; Breast cancer screening; Pure hypercholesterolemia; Vaginal discharge; Special screening for malignant neoplasms, colon; Benign neoplasm of ascending colon; Benign neoplasm of transverse colon; Benign neoplasm of sigmoid colon; Nasal congestion; Fatigue; Blurry vision, bilateral; and Psoriasis of scalp on her problem list.   has a past medical history of Anxiety; Depression; Eczema; GERD  (gastroesophageal reflux disease); and Hypothyroid.  FH:  Family History  Problem Relation Age of Onset  . Stomach cancer Unknown   . Colitis Unknown   . Diabetes Unknown     SH Social History  Substance Use Topics  . Smoking status: Current Every Day Smoker    Packs/day: 0.50    Types: Cigarettes    Start date: 10/08/1977  . Smokeless tobacco: Never Used     Comment: Previous 2 ppd smoker.    . Alcohol use No    Review of Systems Review of systems negative except for pertinent positives and negatives in history of present illness above.     Objective:     Vitals:   05/31/17 1626  BP: 122/80  Pulse: 74  Temp: 98.1 F (36.7 C)  TempSrc: Oral  SpO2: 98%  Weight: 159 lb (72.1 kg)  Height: '5\' 3"'$  (1.6 m)    Physical Exam GEN: appears well, no apparent distress. Head: normocephalic and atraumatic  Eyes: conjunctiva without injection, sclera anicteric. She has what looks like allergic shiner and slightly hypertrophied and dry looking eyelids bilaterlly. PERRL, EOMI, vision 20/20 in both eyes, blurry vision didn't resolve by covering one eye but by moving objects away.  Oropharynx: mmm without erythema or exudation HEM: negative for cervical or periauricular lymphadenopathies ENDO: negative thyromegally CVS: RRR, nl S1&S2, no murmurs, no edema RESP: no IWOB, good air movement bilaterally, CTAB GI: BS present & normal, soft, NTND MSK: no focal tenderness or notable swelling SKIN: psoriatic lesion over the back of her neck proximally extending in to her her head.  NEURO: alert and oiented appropriately, no gross deficits  PSYCH: euthymic mood with congruent affect Depression screen [facility] 2/9 05/31/2017 05/31/2017 02/23/2016 08/01/2015 07/18/2015  Decreased Interest 2 0 0 0 0  Down, Depressed, Hopeless 1 0 0 0 0  PHQ - 2 Score 3 0 0 0 0  Altered sleeping 0 - - - -  Tired, decreased energy 2 - - - -  Change in appetite 2 - - - -  Feeling bad or failure about yourself  1 - -  - -  Trouble concentrating 1 - - - -  Moving slowly or fidgety/restless 0 - - - -  Suicidal thoughts 0 - - - -  PHQ-9 Score 9 - - - -  Difficult doing work/chores Somewhat difficult - - - -    GAD 7 : Generalized Anxiety Score 05/31/2017  Nervous, Anxious, on Edge 1  Control/stop worrying 1  Worry too much - different things 1  Trouble relaxing 0  Restless 0  Easily annoyed or irritable 0  Afraid - awful might happen 0  Total GAD 7 Score 3  Anxiety Difficulty Not difficult at all       Assessment and Plan:  1. Fatigue, unspecified type: unclear cause. Differential diagnosis are hypothyroidism (has history of this), depression (hx of this), anemia, Vit D def (hx of this). Doubt cardiac etiology given no signs of fluid overload. Chest pain likely GERD based on her history. Unfortunately, lab is closed of blood draw. Patient to return for the following labs. - TSH; Future - T4, free; Future - VITAMIN D 25 Hydroxy (Vit-D Deficiency, Fractures); Future - CMP14+EGFR; Future - Vitamin B12; Future - CBC with Differential/Platelet; Future  2. Blurry vision, bilateral: likely hyperopia as the blurry vision resolves when the object is moved away. The could be some component of allergy contributing to this. She has 20/20 visions. PERRL, EOMI. Recommended follow up with her ophthalmologist. Pataday to help with allergies.   3. Psoriasis of scalp: this has been treated as eczema in the past.  -Ambulatory referral to dermatologist.   4. Allergic rhinitis, unspecified seasonality, unspecified trigger: she also has hypertrophied, dry and scaly eyelids bilaterally.  - fluticasone (FLONASE) 50 MCG/ACT nasal spray; Place 2 sprays into both nostrils daily.  Dispense: 16 g; Refill: 6 -discussed about appropriate use  5. Gastroesophageal reflux disease without esophagitis -Continue Prilosec. Recommended staggering with her Synthroid as PPI could decrease absorption.   Orders Placed This Encounter    Procedures  . TSH  . T4, free  . VITAMIN D 25 Hydroxy (Vit-D Deficiency, Fractures)  . CMP14+EGFR  . Vitamin B12  . CBC with Differential/Platelet  . Ambulatory referral to Dermatology   Meds ordered this encounter  Medications  . fluticasone (FLONASE) 50 MCG/ACT nasal spray    Sig: Place 2 sprays into both nostrils daily.    Dispense:  16 g    Refill:  6  . Olopatadine HCl (PATADAY) 0.2 % SOLN    Sig: Apply 1 drop to eye daily.    Dispense:  2.5 mL    Refill:  1   Return in about 2 weeks (around 06/14/2017) for Fatigue.  [name], MD 05/31/17 Pager: [phone]

## 2017-05-31 NOTE — Assessment & Plan Note (Signed)
unclear cause. Differential diagnosis are hypothyroidism (has history of this), depression (hx of this), anemia, Vit D def (hx of this). Doubt cardiac etiology given no signs of fluid overload. Chest pain likely GERD based on her history. Unfortunately, lab is closed of blood draw. Patient to return for the following labs. - TSH; Future - T4, free; Future - VITAMIN D 25 Hydroxy (Vit-D Deficiency, Fractures); Future - CMP14+EGFR; Future - Vitamin B12; Future - CBC with Differential/Platelet; Future

## 2017-05-31 NOTE — Assessment & Plan Note (Signed)
Continue Prilosec. Recommended staggering with her Synthroid as PPI could decrease absorption.

## 2017-05-31 NOTE — Assessment & Plan Note (Signed)
Likely hyperopia as the blurry vision resolves when the object is moved away. The could be some component of allergy contributing to this. She has 20/20 visions. PERRL, EOMI. Recommended follow up with her ophthalmologist. Pataday to help with allergies.

## 2017-06-03 ENCOUNTER — Other Ambulatory Visit: Payer: Self-pay | Admitting: Student

## 2017-06-04 LAB — CBC WITH DIFFERENTIAL/PLATELET
BASOS: 1 %
Basophils Absolute: 0 10*3/uL (ref 0.0–0.2)
EOS (ABSOLUTE): 0.1 10*3/uL (ref 0.0–0.4)
EOS: 2 %
HEMATOCRIT: 41.2 % (ref 34.0–46.6)
Hemoglobin: 13.4 g/dL (ref 11.1–15.9)
IMMATURE GRANULOCYTES: 0 %
Immature Grans (Abs): 0 10*3/uL (ref 0.0–0.1)
Lymphocytes Absolute: 1.9 10*3/uL (ref 0.7–3.1)
Lymphs: 36 %
MCH: 32.1 pg (ref 26.6–33.0)
MCHC: 32.5 g/dL (ref 31.5–35.7)
MCV: 99 fL — ABNORMAL HIGH (ref 79–97)
MONOS ABS: 0.4 10*3/uL (ref 0.1–0.9)
Monocytes: 7 %
NEUTROS ABS: 3 10*3/uL (ref 1.4–7.0)
Neutrophils: 54 %
PLATELETS: 238 10*3/uL (ref 150–379)
RBC: 4.18 x10E6/uL (ref 3.77–5.28)
RDW: 13.9 % (ref 12.3–15.4)
WBC: 5.4 10*3/uL (ref 3.4–10.8)

## 2017-06-04 LAB — COMPREHENSIVE METABOLIC PANEL
ALT: 11 IU/L (ref 0–32)
AST: 20 IU/L (ref 0–40)
Albumin/Globulin Ratio: 1.6 (ref 1.2–2.2)
Albumin: 4.2 g/dL (ref 3.5–5.5)
Alkaline Phosphatase: 73 IU/L (ref 39–117)
BUN/Creatinine Ratio: 13 (ref 9–23)
BUN: 14 mg/dL (ref 6–24)
Bilirubin Total: <0.2 mg/dL (ref 0.0–1.2)
CALCIUM: 9 mg/dL (ref 8.7–10.2)
CO2: 21 mmol/L (ref 20–29)
CREATININE: 1.05 mg/dL — AB (ref 0.57–1.00)
Chloride: 110 mmol/L — ABNORMAL HIGH (ref 96–106)
GFR calc Af Amer: 71 mL/min/{1.73_m2} (ref >59)
GFR calc non Af Amer: 61 mL/min/{1.73_m2} (ref >59)
Globulin, Total: 2.6 g/dL (ref 1.5–4.5)
Glucose: 104 mg/dL — ABNORMAL HIGH (ref 65–99)
Potassium: 4.3 mmol/L (ref 3.5–5.2)
Sodium: 147 mmol/L — ABNORMAL HIGH (ref 134–144)
Total Protein: 6.8 g/dL (ref 6.0–8.5)

## 2017-06-04 LAB — VITAMIN D 25 HYDROXY (VIT D DEFICIENCY, FRACTURES): VIT D 25 HYDROXY: 21.2 ng/mL — AB (ref 30.0–100.0)

## 2017-06-04 LAB — TSH: TSH: 12.22 u[IU]/mL — AB (ref 0.450–4.500)

## 2017-06-04 LAB — T4, FREE: Free T4: 0.78 ng/dL — ABNORMAL LOW (ref 0.82–1.77)

## 2017-06-04 LAB — VITAMIN B12: VITAMIN B 12: 696 pg/mL (ref 232–1245)

## 2017-06-04 LAB — T4: T4, Total: 5.9 ug/dL (ref 4.5–12.0)

## 2017-06-05 ENCOUNTER — Other Ambulatory Visit: Payer: Self-pay | Admitting: Student

## 2017-06-05 ENCOUNTER — Encounter: Payer: Self-pay | Admitting: Student

## 2017-06-05 MED ORDER — CALCIUM CARBONATE-VITAMIN D3 600-400 MG-UNIT PO TABS: ORAL_TABLET | ORAL | 3 refills | Status: DC

## 2017-06-05 NOTE — Progress Notes (Signed)
Blood test from recent visit significant for high TSH and low Free T4, low Vit D and mild hypernatremia. I discussed this with the patient over the phone. She says, she hasn't taken her synthroid in over three weeks after the brand was changed. I recommended starting taking the available brand as soon as possible. I also recommended taking vitD/Ca, and sent prescription to her pharmacy. I told her that the later is available over the counter. Patient voiced understanding and agrees to the plan. She has a follow up in two weeks. She will have repeat TSH in 6 weeks.  Result letter routed to [facility] for mail out.

## 2017-06-10 ENCOUNTER — Other Ambulatory Visit: Payer: Self-pay | Admitting: Student

## 2017-06-10 DIAGNOSIS — K219 Gastro-esophageal reflux disease without esophagitis: Secondary | ICD-10-CM

## 2017-06-17 ENCOUNTER — Encounter: Payer: Self-pay | Admitting: Psychology

## 2017-06-17 ENCOUNTER — Ambulatory Visit (INDEPENDENT_AMBULATORY_CARE_PROVIDER_SITE_OTHER): Payer: BLUE CROSS/BLUE SHIELD | Admitting: Student

## 2017-06-17 ENCOUNTER — Encounter: Payer: Self-pay | Admitting: Student

## 2017-06-17 VITALS — BP 116/80 | HR 81 | Temp 98.0°F | Ht 62.5 in | Wt 157.0 lb

## 2017-06-17 DIAGNOSIS — F331 Major depressive disorder, recurrent, moderate: Secondary | ICD-10-CM | POA: Diagnosis not present

## 2017-06-17 DIAGNOSIS — F411 Generalized anxiety disorder: Secondary | ICD-10-CM | POA: Diagnosis not present

## 2017-06-17 DIAGNOSIS — E870 Hyperosmolality and hypernatremia: Secondary | ICD-10-CM

## 2017-06-17 DIAGNOSIS — R5383 Other fatigue: Secondary | ICD-10-CM

## 2017-06-17 MED ORDER — ESCITALOPRAM OXALATE 10 MG PO TABS: 10.0000 mg | ORAL_TABLET | Freq: Every day | ORAL | 0 refills | Status: DC

## 2017-06-17 MED ORDER — ZOSTER VAC RECOMB ADJUVANTED 50 MCG/0.5ML IM SUSR: 0.5000 mL | Freq: Once | INTRAMUSCULAR | 1 refills | Status: AC
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
## 2017-06-17 NOTE — Patient Instructions (Signed)
It was great seeing you today! I'm sorry about the stressors at home! We have started a new medication for anxiety and depression. It is very important that you take this medication daily. I recommend follow-up in 2 weeks or sooner if needed.  If we did any lab work today, and the results require attention, either me or my nurse will get in touch with you. If everything is normal, you will get a letter in mail and a message via . If you don't hear from [place] in two weeks, please give [place] a call. Otherwise, we look forward to seeing you again at your next visit. If you have any questions or concerns before then, please call the clinic at [phone].  Please bring all your medications to every doctors visit  Sign up for My Chart to have easy access to your labs results, and communication with your Primary care physician.    Please check-out at the front desk before leaving the clinic.    Take Care,   Dr. Cyndia Skeeters
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
## 2017-06-18 ENCOUNTER — Encounter: Payer: Self-pay | Admitting: Student

## 2017-06-18 LAB — BASIC METABOLIC PANEL
BUN/Creatinine Ratio: 17 (ref 9–23)
BUN: 16 mg/dL (ref 6–24)
CO2: 22 mmol/L (ref 20–29)
CREATININE: 0.96 mg/dL (ref 0.57–1.00)
Calcium: 9.6 mg/dL (ref 8.7–10.2)
Chloride: 107 mmol/L — ABNORMAL HIGH (ref 96–106)
GFR calc Af Amer: 79 mL/min/{1.73_m2} (ref >59)
GFR, EST NON AFRICAN AMERICAN: 68 mL/min/{1.73_m2} (ref >59)
Glucose: 84 mg/dL (ref 65–99)
Potassium: 4.1 mmol/L (ref 3.5–5.2)
SODIUM: 144 mmol/L (ref 134–144)

## 2017-07-08 ENCOUNTER — Ambulatory Visit: Payer: BLUE CROSS/BLUE SHIELD | Admitting: Student

## 2017-07-08 ENCOUNTER — Ambulatory Visit: Payer: BLUE CROSS/BLUE SHIELD

## 2017-07-12 ENCOUNTER — Other Ambulatory Visit: Payer: Self-pay | Admitting: Student

## 2017-07-12 DIAGNOSIS — F411 Generalized anxiety disorder: Secondary | ICD-10-CM

## 2017-07-12 DIAGNOSIS — F331 Major depressive disorder, recurrent, moderate: Secondary | ICD-10-CM

## 2017-07-18 ENCOUNTER — Ambulatory Visit: Payer: BLUE CROSS/BLUE SHIELD | Admitting: Student

## 2017-07-18 ENCOUNTER — Ambulatory Visit: Payer: BLUE CROSS/BLUE SHIELD

## 2017-08-17 ENCOUNTER — Other Ambulatory Visit: Payer: Self-pay | Admitting: Student

## 2017-08-17 DIAGNOSIS — K219 Gastro-esophageal reflux disease without esophagitis: Secondary | ICD-10-CM

## 2017-10-15 ENCOUNTER — Other Ambulatory Visit: Payer: Self-pay | Admitting: Student

## 2017-10-15 DIAGNOSIS — K219 Gastro-esophageal reflux disease without esophagitis: Secondary | ICD-10-CM

## 2017-11-16 ENCOUNTER — Other Ambulatory Visit: Payer: Self-pay | Admitting: Student

## 2017-12-12 ENCOUNTER — Other Ambulatory Visit: Payer: Self-pay | Admitting: Student

## 2017-12-12 DIAGNOSIS — K219 Gastro-esophageal reflux disease without esophagitis: Secondary | ICD-10-CM

## 2018-02-16 ENCOUNTER — Other Ambulatory Visit: Payer: Self-pay | Admitting: Student

## 2018-02-16 DIAGNOSIS — K219 Gastro-esophageal reflux disease without esophagitis: Secondary | ICD-10-CM

## 2018-04-16 ENCOUNTER — Other Ambulatory Visit: Payer: Self-pay

## 2018-04-16 DIAGNOSIS — K219 Gastro-esophageal reflux disease without esophagitis: Secondary | ICD-10-CM

## 2018-04-16 MED ORDER — OMEPRAZOLE 20 MG PO CPDR: 20.0000 mg | DELAYED_RELEASE_CAPSULE | Freq: Every day | ORAL | 1 refills | Status: DC

## 2018-04-16 MED ORDER — PRAVASTATIN SODIUM 40 MG PO TABS: 40.0000 mg | ORAL_TABLET | Freq: Every day | ORAL | 0 refills | Status: DC

## 2018-05-16 ENCOUNTER — Other Ambulatory Visit: Payer: Self-pay

## 2018-05-16 ENCOUNTER — Other Ambulatory Visit: Payer: Self-pay | Admitting: Family Medicine

## 2018-05-16 DIAGNOSIS — F331 Major depressive disorder, recurrent, moderate: Secondary | ICD-10-CM

## 2018-05-16 DIAGNOSIS — F411 Generalized anxiety disorder: Secondary | ICD-10-CM

## 2018-05-16 DIAGNOSIS — K219 Gastro-esophageal reflux disease without esophagitis: Secondary | ICD-10-CM

## 2018-05-16 MED ORDER — ESCITALOPRAM OXALATE 10 MG PO TABS: 10.0000 mg | ORAL_TABLET | Freq: Every day | ORAL | 3 refills | Status: DC

## 2018-08-02 ENCOUNTER — Other Ambulatory Visit: Payer: Self-pay | Admitting: Family Medicine

## 2018-10-28 ENCOUNTER — Other Ambulatory Visit: Payer: Self-pay | Admitting: Family Medicine

## 2019-01-27 ENCOUNTER — Other Ambulatory Visit: Payer: Self-pay | Admitting: *Deleted

## 2019-01-28 MED ORDER — PRAVASTATIN SODIUM 40 MG PO TABS: 40.0000 mg | ORAL_TABLET | Freq: Every day | ORAL | 0 refills | Status: DC

## 2019-02-10 ENCOUNTER — Other Ambulatory Visit: Payer: Self-pay

## 2019-02-10 MED ORDER — LEVOTHYROXINE SODIUM 112 MCG PO TABS: 112.0000 ug | ORAL_TABLET | Freq: Every day | ORAL | 1 refills | Status: DC

## 2019-05-05 ENCOUNTER — Other Ambulatory Visit: Payer: Self-pay | Admitting: Family Medicine

## 2019-05-08 ENCOUNTER — Other Ambulatory Visit: Payer: Self-pay

## 2019-05-08 DIAGNOSIS — K219 Gastro-esophageal reflux disease without esophagitis: Secondary | ICD-10-CM

## 2019-05-08 MED ORDER — OMEPRAZOLE 20 MG PO CPDR: DELAYED_RELEASE_CAPSULE | ORAL | 3 refills | Status: DC

## 2019-05-26 ENCOUNTER — Encounter: Payer: Self-pay | Admitting: Family Medicine

## 2019-07-06 ENCOUNTER — Encounter: Payer: Self-pay | Admitting: Family Medicine

## 2019-07-06 ENCOUNTER — Ambulatory Visit (INDEPENDENT_AMBULATORY_CARE_PROVIDER_SITE_OTHER): Payer: BLUE CROSS/BLUE SHIELD | Admitting: Family Medicine

## 2019-07-06 ENCOUNTER — Other Ambulatory Visit: Payer: Self-pay

## 2019-07-06 VITALS — BP 118/66 | HR 61 | Wt 155.6 lb

## 2019-07-06 DIAGNOSIS — J449 Chronic obstructive pulmonary disease, unspecified: Secondary | ICD-10-CM | POA: Diagnosis not present

## 2019-07-06 DIAGNOSIS — F331 Major depressive disorder, recurrent, moderate: Secondary | ICD-10-CM

## 2019-07-06 DIAGNOSIS — F411 Generalized anxiety disorder: Secondary | ICD-10-CM

## 2019-07-06 DIAGNOSIS — K219 Gastro-esophageal reflux disease without esophagitis: Secondary | ICD-10-CM | POA: Diagnosis not present

## 2019-07-06 DIAGNOSIS — Z79899 Other long term (current) drug therapy: Secondary | ICD-10-CM

## 2019-07-06 DIAGNOSIS — Z720 Tobacco use: Secondary | ICD-10-CM

## 2019-07-06 DIAGNOSIS — L409 Psoriasis, unspecified: Secondary | ICD-10-CM

## 2019-07-06 MED ORDER — PRAVASTATIN SODIUM 40 MG PO TABS: 40.0000 mg | ORAL_TABLET | Freq: Every day | ORAL | 0 refills | Status: DC

## 2019-07-06 MED ORDER — OMEPRAZOLE 20 MG PO CPDR: DELAYED_RELEASE_CAPSULE | ORAL | 3 refills | Status: DC

## 2019-07-06 MED ORDER — ESCITALOPRAM OXALATE 10 MG PO TABS: 10.0000 mg | ORAL_TABLET | Freq: Every day | ORAL | 3 refills | Status: DC

## 2019-07-06 MED ORDER — LEVOTHYROXINE SODIUM 112 MCG PO TABS: 112.0000 ug | ORAL_TABLET | Freq: Every day | ORAL | 1 refills | Status: DC

## 2019-07-06 MED ORDER — NICOTINE POLACRILEX 2 MG MT GUM: 2.0000 mg | CHEWING_GUM | OROMUCOSAL | 0 refills | Status: DC | PRN

## 2019-07-06 MED ORDER — NICOTINE 14 MG/24HR TD PT24: 14.0000 mg | MEDICATED_PATCH | Freq: Every day | TRANSDERMAL | 0 refills | Status: DC

## 2019-07-06 NOTE — Progress Notes (Signed)
[facility] Phone: [phone]     Helen Jones - 53 y.o. female MRN [id number]  Date of birth: 06/03/1966  Subjective:   cc: smoking, copd, hearing loss  HPI:  Smoking:  Smoking since she was 13.  Was at 1/2 ppd for a year.  2ppd before that.  Now she is back to 2 ppd occasionally.  Two relatives recently passed away in 05-24-2023 and 2023-06-24. Her Chest was hurting about two weeks ago, she tried her sister's inhaler and her chest pain improved. Describes it as a chest 'tightness'.  She has used her sister's inhaler for the past two weeks as needed.  No chest pain since then. Never diagnosed with asthma. Has had  Increased DOA.  She can walk to her mailbox and back and she will be out of breath.  Has taken chantix twice before.  First time it helped.  Second time she took it with lexapro and then it caused chest 'tightness' so she stopped.  She would like to be evaluated for COPD.     psoriasis: has psoriasis in her ears and scalp.  She puts peroxide and Sweet oil in her ears. Daughter states that patient has been losing her hearing, but exact time frame could not be given.    Medications: patient needs refills on synthroid, lexapro, pravachol, prilosec.  States she takes these medications daily (except lexapro, which she takes as needed).  Denies side effects including RUQ pain, muscle pain.    ROS: See HPI for pertinent positives and negatives  Family history reviewed for today's visit. No changes.  Social history- patient is a current smoker   Objective:   BP 118/66   Pulse 61   Wt 155 lb 9.6 oz (70.6 kg)   SpO2 99%   BMI 28.01 kg/m  Gen: NAD, alert and oriented, cooperative with exam HEENT: TM normal bilaterally.  External ear canals appear hypertrophied and tortuous with scaling.  CV: normal rate, regular rhythm. No murmurs, no rubs.  Resp: LCTAB, no wheezes, crackles. normal work of breathing Msk: No edema, warm, normal tone, moves UE/LE  spontaneously Skin: scaling, psoriatic lesions in the scalp and pinna bilaterally Psych: Appropriate behavior  Assessment/Plan:   Tobacco abuse 2ppd smoker.  Has been smoking for ~ 40 years.  Has tried to quit before.  Desires to quit again but does not want to use chantix.  - nicorette patch 14mg  daily and gum 2mg  PRN - educated on proper use.   - Quit line number given - asked pt to call and inform us of which inhaler she is using - referral to pulm for COPD evaluation.   Major depressive disorder, recurrent episode ([place]) Recent deaths in family.  Has been feeling more depressed lately, which has caused her to increase smoking.  Takes lexapro 'as needed'.  - advised pt to take lexapro consistently - f/u in 3-4 weeks with her PCP to further discuss depression/anxiety   Psoriasis of scalp States she is not currently taking anything for this.  Looks like she has been prescribed clobetasol 0.05% in the past.  Will likely need a high potency steroid given the location. Also occurs on the pinna and external auditory meatus. Possibly affecting her hearing, but TM appear normal b/l.  - f/u with PCP in 3-4 weeks to start treatment and possible re-establish with dermatology.    Medication management Refilled pravastatin, prilosec, lexapro, synthroid.  Has not been to our clinic since 2018.  Advised her  to establish with her pcp, dr. Shan Levans, in the next 3-4 weeks.    Clemetine Marker, MD PGY-2 [facility]

## 2019-07-06 NOTE — Patient Instructions (Addendum)
Someone will call you to schedule a pulmonology referral.  To help quit smoking, you can use the patch and the gum together.  Use the patch every day and then when you get the urge to smoke during the day chew the gum.  If you do have trouble affording these, you can call the smoking quit line at 1 800 quit now.  They will send you a free nicotine replacement for 1 month.  I have refilled your other medications.  It is best for you to follow-up with your primary care provider, Dr. Shan Levans for management of these conditions in the future.  She will also help manage your psoriasis.  You should schedule an appointment with her in the next 3 to 4 weeks.  Have a great day,   Clemetine Marker, MD

## 2019-07-07 DIAGNOSIS — Z79899 Other long term (current) drug therapy: Secondary | ICD-10-CM | POA: Insufficient documentation

## 2019-07-07 DIAGNOSIS — Z122 Encounter for screening for malignant neoplasm of respiratory organs: Secondary | ICD-10-CM | POA: Insufficient documentation

## 2019-07-07 NOTE — Assessment & Plan Note (Signed)
States she is not currently taking anything for this.  Looks like she has been prescribed clobetasol 0.05% in the past.  Will likely need a high potency steroid given the location. Also occurs on the pinna and external auditory meatus. Possibly affecting her hearing, but TM appear normal b/l.  - f/u with PCP in 3-4 weeks to start treatment and possible re-establish with dermatology.

## 2019-07-07 NOTE — Assessment & Plan Note (Signed)
2ppd smoker.  Has been smoking for ~ 40 years.  Has tried to quit before.  Desires to quit again but does not want to use chantix.  - nicorette patch 14mg  daily and gum 2mg  PRN - educated on proper use.   - Quit line number given - asked pt to call and inform us of which inhaler she is using - referral to pulm for COPD evaluation.

## 2019-07-07 NOTE — Assessment & Plan Note (Signed)
Recent deaths in family.  Has been feeling more depressed lately, which has caused her to increase smoking.  Takes lexapro 'as needed'.  - advised pt to take lexapro consistently - f/u in 3-4 weeks with her PCP to further discuss depression/anxiety

## 2019-07-07 NOTE — Assessment & Plan Note (Signed)
Refilled pravastatin, prilosec, lexapro, synthroid.  Has not been to our clinic since 2018.  Advised her to establish with her pcp, dr. Shan Levans, in the next 3-4 weeks.

## 2019-07-24 ENCOUNTER — Other Ambulatory Visit: Payer: Self-pay | Admitting: Family Medicine

## 2019-07-24 ENCOUNTER — Telehealth: Payer: Self-pay | Admitting: *Deleted

## 2019-07-24 MED ORDER — ALBUTEROL SULFATE HFA 108 (90 BASE) MCG/ACT IN AERS: 2.0000 | INHALATION_SPRAY | RESPIRATORY_TRACT | 1 refills | Status: DC | PRN

## 2019-07-24 NOTE — Telephone Encounter (Signed)
Pt informed.  Her appt @ pulmonology is 07/30/19.  Christen Bame, CMA

## 2019-07-24 NOTE — Telephone Encounter (Signed)
Pt states that she was to call back and let Dr. Jeannine Kitten know what the inhaler she used proair.  She is requesting for this to be sent in.  Christen Bame, CMA

## 2019-07-24 NOTE — Telephone Encounter (Signed)
Sent in Rx for albuterol inhaler but patient will still need to be seen by pulmonologist for official copd evaluation.

## 2019-07-31 ENCOUNTER — Ambulatory Visit: Payer: BLUE CROSS/BLUE SHIELD | Admitting: Family Medicine

## 2019-10-14 ENCOUNTER — Ambulatory Visit: Payer: BLUE CROSS/BLUE SHIELD | Attending: Internal Medicine

## 2019-10-14 DIAGNOSIS — Z20822 Contact with and (suspected) exposure to covid-19: Secondary | ICD-10-CM

## 2019-10-16 LAB — NOVEL CORONAVIRUS, NAA: SARS-CoV-2, NAA: NOT DETECTED

## 2019-11-07 ENCOUNTER — Other Ambulatory Visit: Payer: Self-pay | Admitting: Family Medicine

## 2019-11-09 ENCOUNTER — Telehealth: Payer: Self-pay

## 2019-11-09 ENCOUNTER — Other Ambulatory Visit: Payer: Self-pay

## 2019-11-09 MED ORDER — PRAVASTATIN SODIUM 40 MG PO TABS: 40.0000 mg | ORAL_TABLET | Freq: Every day | ORAL | 3 refills | Status: DC

## 2019-11-09 NOTE — Telephone Encounter (Signed)
Received fax from pharmacy. Note on Rx states for pt to schedule office visit before next refill  I have called and LM for pt to schedule appt to get her medicine refilled. Ottis Stain, CMA

## 2020-02-15 ENCOUNTER — Ambulatory Visit: Payer: BLUE CROSS/BLUE SHIELD | Attending: Internal Medicine

## 2020-02-15 DIAGNOSIS — Z20822 Contact with and (suspected) exposure to covid-19: Secondary | ICD-10-CM

## 2020-02-16 ENCOUNTER — Telehealth: Payer: Self-pay | Admitting: Family Medicine

## 2020-02-16 ENCOUNTER — Other Ambulatory Visit: Payer: Self-pay | Admitting: Family Medicine

## 2020-02-16 DIAGNOSIS — E039 Hypothyroidism, unspecified: Secondary | ICD-10-CM

## 2020-02-16 LAB — NOVEL CORONAVIRUS, NAA: SARS-CoV-2, NAA: DETECTED — AB

## 2020-02-16 LAB — SARS-COV-2, NAA 2 DAY TAT

## 2020-02-16 NOTE — Telephone Encounter (Signed)
Contacted pt and informed her of below and she said she would have to call because currently she is waiting for results from her Covid test and I told her that once she is cleared to go back to normal to call office to schedule that lab visit.Neftaly Inzunza Zimmerman Rumple, CMA

## 2020-02-16 NOTE — Telephone Encounter (Signed)
Please call Helen Jones and ask her to schedule a lab appointment to get a TSH drawn.  This is important for chronic management of hypothyroidism, and the last TSH we have for her was in 2018.  She needs to get these done once yearly.  I have given her a 30 day supply to give her time to make this appointment, and I will order the TSH.  Thanks.

## 2020-02-17 ENCOUNTER — Ambulatory Visit (HOSPITAL_COMMUNITY)
Admission: RE | Admit: 2020-02-17 | Discharge: 2020-02-17 | Disposition: A | Discharge status: Home / Self Care | Payer: BLUE CROSS/BLUE SHIELD | Source: Ambulatory Visit | Attending: Pulmonary Disease | Admitting: Pulmonary Disease

## 2020-02-17 ENCOUNTER — Telehealth: Payer: Self-pay | Admitting: Unknown Physician Specialty

## 2020-02-17 ENCOUNTER — Other Ambulatory Visit: Payer: Self-pay | Admitting: Unknown Physician Specialty

## 2020-02-17 ENCOUNTER — Ambulatory Visit (HOSPITAL_COMMUNITY): Payer: BLUE CROSS/BLUE SHIELD

## 2020-02-17 DIAGNOSIS — J440 Chronic obstructive pulmonary disease with acute lower respiratory infection: Secondary | ICD-10-CM | POA: Diagnosis present

## 2020-02-17 DIAGNOSIS — U071 COVID-19: Secondary | ICD-10-CM | POA: Diagnosis present

## 2020-02-17 DIAGNOSIS — J209 Acute bronchitis, unspecified: Secondary | ICD-10-CM | POA: Insufficient documentation

## 2020-02-17 MED ORDER — SODIUM CHLORIDE 0.9 % IV SOLN
Freq: Once | INTRAVENOUS | Status: AC
  Filled 2020-02-17: qty 20

## 2020-02-17 MED ORDER — ALBUTEROL SULFATE HFA 108 (90 BASE) MCG/ACT IN AERS: 2.0000 | INHALATION_SPRAY | Freq: Once | RESPIRATORY_TRACT | Status: DC | PRN

## 2020-02-17 MED ORDER — METHYLPREDNISOLONE SODIUM SUCC 125 MG IJ SOLR: 125.0000 mg | Freq: Once | INTRAMUSCULAR | Status: DC | PRN

## 2020-02-17 MED ORDER — FAMOTIDINE IN NACL 20-0.9 MG/50ML-% IV SOLN: 20.0000 mg | Freq: Once | INTRAVENOUS | Status: DC | PRN

## 2020-02-17 MED ORDER — EPINEPHRINE 0.3 MG/0.3ML IJ SOAJ: 0.3000 mg | Freq: Once | INTRAMUSCULAR | Status: DC | PRN

## 2020-02-17 MED ORDER — DIPHENHYDRAMINE HCL 50 MG/ML IJ SOLN: 50.0000 mg | Freq: Once | INTRAMUSCULAR | Status: DC | PRN

## 2020-02-17 MED ORDER — SODIUM CHLORIDE 0.9 % IV SOLN: INTRAVENOUS | Status: DC | PRN

## 2020-02-17 NOTE — Telephone Encounter (Signed)
  I connected by phone with Helen Jones on 02/17/2020 at 8:45 AM to discuss the potential use of an new treatment for mild to moderate COVID-19 viral infection in non-hospitalized patients.  This patient is a 54 y.o. female that meets the FDA criteria for Emergency Use Authorization of bamlanivimab/etesevimab or casirivimab/imdevimab.  Has a (+) direct SARS-CoV-2 viral test result  Has mild or moderate COVID-19   Is ? 56 years of age and weighs ? 40 kg  Is NOT hospitalized due to COVID-19  Is NOT requiring oxygen therapy or requiring an increase in baseline oxygen flow rate due to COVID-19  Is within 10 days of symptom onset  Has at least one of the high risk factor(s) for progression to severe COVID-19 and/or hospitalization as defined in EUA.  Specific high risk criteria : COPD   I have spoken and communicated the following to the patient or parent/caregiver:  1. FDA has authorized the emergency use of bamlanivimab/etesevimab and casirivimab\imdevimab for the treatment of mild to moderate COVID-19 in adults and pediatric patients with positive results of direct SARS-CoV-2 viral testing who are 101 years of age and older weighing at least 40 kg, and who are at high risk for progressing to severe COVID-19 and/or hospitalization.  2. The significant known and potential risks and benefits of bamlanivimab/etesevimab and casirivimab\imdevimab, and the extent to which such potential risks and benefits are unknown.  3. Information on available alternative treatments and the risks and benefits of those alternatives, including clinical trials.  4. Patients treated with bamlanivimab/etesevimab and casirivimab\imdevimab should continue to self-isolate and use infection control measures (e.g., wear mask, isolate, social distance, avoid sharing personal items, clean and disinfect "high touch" surfaces, and frequent handwashing) according to CDC guidelines.   5. The patient or parent/caregiver  has the option to accept or refuse bamlanivimab/etesevimab or casirivimab\imdevimab .  After reviewing this information with the patient, The patient agreed to proceed with receiving the bamlanimivab infusion and will be provided a copy of the Fact sheet prior to receiving the infusion.[name] 02/17/2020 8:45 AM

## 2020-02-17 NOTE — Discharge Instructions (Signed)

## 2020-02-20 ENCOUNTER — Other Ambulatory Visit: Payer: Self-pay | Admitting: Family Medicine

## 2020-03-15 ENCOUNTER — Other Ambulatory Visit: Payer: Self-pay | Admitting: Family Medicine

## 2020-03-15 NOTE — Telephone Encounter (Addendum)
Per Dr. Maudie Flakes last telephone note on 02/16/20. Patient needs to come in for TSH and office visit before refill as she has not had it checked since 2018. Her TSH was very abnormal at that time. Patient needs an appointment to receive refill. Otherwise, will defer to PCP.   [place] Maudell Stanbrough, DO PGY-3, [facility]

## 2020-03-17 ENCOUNTER — Telehealth: Payer: Self-pay | Admitting: *Deleted

## 2020-03-17 NOTE — Telephone Encounter (Signed)
LVM to call office back.  Please inform her of below and assist in getting an appointment scheduled. I will also send her a MyCHart message.[address] [place], [place], DO     03/15/20 10:47 PM Note   Per Dr. Maudie Flakes last telephone note on 02/16/20. Patient needs to come in for TSH and office visit before refill as she has not had it checked since 2018. Her TSH was very abnormal at that time. Patient needs an appointment to receive refill. Otherwise, will defer to PCP.   [place] Shirley, DO PGY-3, [facility]

## 2020-03-21 NOTE — Telephone Encounter (Signed)
I have sent another 30-day refill of patient's levothyroxine to ensure that she does not run out of this medication.  However, she needs to be seen and get a TSH checked in the next month.  This will be her last refill of this medication before getting this lab.

## 2020-03-22 NOTE — Telephone Encounter (Signed)
Spoke to pt. PCP not available on Friday's. Appt made with Dr. Criss Rosales on 6/18. For med refills and lab work. Ottis Stain, CMA

## 2020-03-25 ENCOUNTER — Ambulatory Visit (INDEPENDENT_AMBULATORY_CARE_PROVIDER_SITE_OTHER): Payer: BLUE CROSS/BLUE SHIELD | Admitting: Family Medicine

## 2020-03-25 ENCOUNTER — Other Ambulatory Visit: Payer: Self-pay

## 2020-03-25 ENCOUNTER — Encounter: Payer: Self-pay | Admitting: Family Medicine

## 2020-03-25 VITALS — BP 102/64 | HR 91 | Ht 63.0 in | Wt 150.8 lb

## 2020-03-25 DIAGNOSIS — Z122 Encounter for screening for malignant neoplasm of respiratory organs: Secondary | ICD-10-CM

## 2020-03-25 DIAGNOSIS — L409 Psoriasis, unspecified: Secondary | ICD-10-CM

## 2020-03-25 DIAGNOSIS — E039 Hypothyroidism, unspecified: Secondary | ICD-10-CM | POA: Diagnosis not present

## 2020-03-25 DIAGNOSIS — E663 Overweight: Secondary | ICD-10-CM | POA: Diagnosis not present

## 2020-03-25 DIAGNOSIS — J449 Chronic obstructive pulmonary disease, unspecified: Secondary | ICD-10-CM | POA: Diagnosis not present

## 2020-03-25 DIAGNOSIS — Z720 Tobacco use: Secondary | ICD-10-CM

## 2020-03-25 DIAGNOSIS — E785 Hyperlipidemia, unspecified: Secondary | ICD-10-CM

## 2020-03-25 MED ORDER — ALBUTEROL SULFATE HFA 108 (90 BASE) MCG/ACT IN AERS: INHALATION_SPRAY | RESPIRATORY_TRACT | 1 refills | Status: DC

## 2020-03-25 MED ORDER — SALICYLIC ACID 2 % EX SHAM: MEDICATED_SHAMPOO | Freq: Every day | CUTANEOUS | 12 refills | Status: DC | PRN

## 2020-03-25 MED ORDER — BUPROPION HCL 75 MG PO TABS: 75.0000 mg | ORAL_TABLET | Freq: Two times a day (BID) | ORAL | 0 refills | Status: DC

## 2020-03-25 NOTE — Patient Instructions (Signed)
Today we talked about a number of issues.    I think the bump on your thumb is likely a wart, I think that you can use the medication you see over-the-counter for plantars warts to try and treat this.  If something gets worse you are welcome to schedule with our skin clinic.   We ordered a thyroid test today to make sure that the dose of thyroid medication you are on is appropriate.  We ordered a basic metabolic test to check your kidney function and make sure your metabolites are in order.  We ordered a lipid test to test your cholesterol to make sure you are taking the correct dose of cholesterol medication.  I ordered some Wellbutrin to try and help you in your goal of eventually quitting smoking.  If you have any problems with this please let us know immediately and stop taking it, I know that you said you had no problem with it in the past so I think this would be unlikely.  After you have this in your system for a few weeks I would suggest trying those patches that you have for nicotine replacement therapy so that you can stop smoking.  I have also ordered the low-dose chest CT for you, until you have quit smoking for 15 years it is currently the guidelines from the USPS TF that you get a chest CT every single year.  But remember I actually think that you can quit smoking and I think that you are aware that you can do this as well.  I have reordered the psoriasis shampoo and hydrocortisone cream that you have in your medical history, I hope this helps your psoriasis feel better.  This was quite a bit of information to cover so if you have follow-up questions and need to be seen again please come back as soon as we can be helpful to you.  Dr. Criss Rosales

## 2020-03-25 NOTE — Progress Notes (Signed)
° ° °  SUBJECTIVE:   CHIEF COMPLAINT / HPI:   Hyperlipemia Family history of heart disease, patient is on a statin and would like a cholesterol test  Tobacco abuse 30-40-pack-year history, still smoking.  Says she would like to attempt soon but does not want to start today on a cessation attempt, daughter is with her and is also encouraging her to quit smoking  Psoriasis of scalp Patient requests refill of prior shampoo and hydrocortisone  Hypothyroidism Has been taking 112 mcg levothyroxine chronically for years, told to come in by Dr. Shan Levans who would not refill her levothyroxine without a test of TSH because we have not had one in 3 years.  PERTINENT  PMH / PSH:   OBJECTIVE:   BP 102/64    Pulse 91    Ht 5\' 3"  (1.6 m)    Wt 150 lb 12.8 oz (68.4 kg)    SpO2 99%    BMI 26.71 kg/m   General: Alert and pleasant, no medical distress Lungs: There to auscultation bilaterally, no wheeze or cough Cardiac: Regular rate and rhythm to my exam  ASSESSMENT/PLAN:   Hyperlipemia This history the patient's LDL goal would be 100, LDL was 88  No need to change current medication  Encounter for screening for lung cancer Patient smoking since she was 37, based on story has at least a 35-year pack history, currently smoking  Is going to start Wellbutrin and get a low-dose chest CT, she has been told she will need to do this for at least the next 15 years so she quit smoking now  Overweight Screening BMP unremarkable  Tobacco abuse 30-40-pack-year history, still smoking.  Will start Wellbutrin.  Get low-dose chest CT.  Patient will take Wellbutrin for a few weeks and then will consider patches for cessation attempt  Psoriasis of scalp Refill of prior shampoo and hydrocortisone  Hypothyroidism Has been taking 112 mcg levothyroxine chronically for years, test of TSH show TSH was low, called patient and reduced dose of levothyroxine to 100 mcg and scheduled follow-up in 30 days.  Chronic  obstructive pulmonary disease (HCC) Refill of chronic albuterol inhaler, patient has another inhaler prescribed by another physician that she does not know what it is.  I explained to her how important it was for all of her physicians to know all of her medications and asked her to bring the medication list from his other physicians to our office     Sherene Sires, [place]

## 2020-03-26 LAB — BASIC METABOLIC PANEL
BUN/Creatinine Ratio: 22 (ref 9–23)
BUN: 20 mg/dL (ref 6–24)
CO2: 19 mmol/L — ABNORMAL LOW (ref 20–29)
Calcium: 9.2 mg/dL (ref 8.7–10.2)
Chloride: 108 mmol/L — ABNORMAL HIGH (ref 96–106)
Creatinine, Ser: 0.9 mg/dL (ref 0.57–1.00)
GFR calc Af Amer: 83 mL/min/{1.73_m2} (ref >59)
GFR calc non Af Amer: 72 mL/min/{1.73_m2} (ref >59)
Glucose: 96 mg/dL (ref 65–99)
Potassium: 3.8 mmol/L (ref 3.5–5.2)
Sodium: 143 mmol/L (ref 134–144)

## 2020-03-26 LAB — LDL CHOLESTEROL, DIRECT: LDL Direct: 88 mg/dL (ref 0–99)

## 2020-03-26 LAB — TSH: TSH: 0.364 u[IU]/mL — ABNORMAL LOW (ref 0.450–4.500)

## 2020-03-26 MED ORDER — LEVOTHYROXINE SODIUM 100 MCG PO TABS: 100.0000 ug | ORAL_TABLET | Freq: Every day | ORAL | 0 refills | Status: DC

## 2020-03-26 NOTE — Assessment & Plan Note (Signed)
Screening BMP unremarkable

## 2020-03-26 NOTE — Assessment & Plan Note (Signed)
30-40-pack-year history, still smoking.  Will start Wellbutrin.  Get low-dose chest CT.  Patient will take Wellbutrin for a few weeks and then will consider patches for cessation attempt

## 2020-03-26 NOTE — Assessment & Plan Note (Signed)
Refill of prior shampoo and hydrocortisone

## 2020-03-26 NOTE — Assessment & Plan Note (Signed)
Refill of chronic albuterol inhaler, patient has another inhaler prescribed by another physician that she does not know what it is.  I explained to her how important it was for all of her physicians to know all of her medications and asked her to bring the medication list from his other physicians to our office

## 2020-03-26 NOTE — Assessment & Plan Note (Signed)
Has been taking 112 mcg levothyroxine chronically for years, test of TSH show TSH was low, called patient and reduced dose of levothyroxine to 100 mcg and scheduled follow-up in 30 days.

## 2020-03-26 NOTE — Assessment & Plan Note (Signed)
Patient smoking since she was 51, based on story has at least a 35-year pack history, currently smoking  Is going to start Wellbutrin and get a low-dose chest CT, she has been told she will need to do this for at least the next 15 years so she quit smoking now

## 2020-03-26 NOTE — Assessment & Plan Note (Signed)
This history the patient's LDL goal would be 100, LDL was 88  No need to change current medication

## 2020-03-31 ENCOUNTER — Encounter: Payer: Self-pay | Admitting: *Deleted

## 2020-03-31 ENCOUNTER — Telehealth: Payer: Self-pay | Admitting: *Deleted

## 2020-03-31 NOTE — Telephone Encounter (Signed)
Received referral for low dose lung cancer screening CT scan. Message left at phone number listed in EMR for patient to call me back to facilitate scheduling scan.  

## 2020-04-17 ENCOUNTER — Other Ambulatory Visit: Payer: Self-pay | Admitting: Family Medicine

## 2020-04-17 DIAGNOSIS — Z720 Tobacco use: Secondary | ICD-10-CM

## 2020-04-19 ENCOUNTER — Encounter: Payer: Self-pay | Admitting: Family Medicine

## 2020-04-21 ENCOUNTER — Other Ambulatory Visit: Payer: Self-pay | Admitting: Family Medicine

## 2020-04-21 DIAGNOSIS — E039 Hypothyroidism, unspecified: Secondary | ICD-10-CM

## 2020-04-22 ENCOUNTER — Other Ambulatory Visit: Payer: Self-pay

## 2020-04-22 ENCOUNTER — Encounter: Payer: Self-pay | Admitting: Family Medicine

## 2020-04-22 ENCOUNTER — Ambulatory Visit (INDEPENDENT_AMBULATORY_CARE_PROVIDER_SITE_OTHER): Payer: BLUE CROSS/BLUE SHIELD | Admitting: Family Medicine

## 2020-04-22 VITALS — BP 110/68 | HR 86 | Ht 63.0 in | Wt 150.0 lb

## 2020-04-22 DIAGNOSIS — R195 Other fecal abnormalities: Secondary | ICD-10-CM | POA: Diagnosis not present

## 2020-04-22 DIAGNOSIS — Z0000 Encounter for general adult medical examination without abnormal findings: Secondary | ICD-10-CM | POA: Diagnosis not present

## 2020-04-22 DIAGNOSIS — E039 Hypothyroidism, unspecified: Secondary | ICD-10-CM

## 2020-04-22 LAB — POCT HEMOGLOBIN: Hemoglobin: 14.1 g/dL (ref 11–14.6)

## 2020-04-22 NOTE — Patient Instructions (Addendum)
Thank you for coming to see me today. It was a pleasure. Today we talked about:     Please follow-up with PCP in 1 week I encourage you to continue your journey to quitting smoking  If you have any questions or concerns, please do not hesitate to call the office at [phone].  Best,   Carollee Leitz, MD Family [facility] Res[facility]

## 2020-04-22 NOTE — Progress Notes (Signed)
    SUBJECTIVE:   CHIEF COMPLAINT / HPI: for follow up thyroid medication  Patient with multiple concerns.  Given limited time patient advised to make multiple appointments to discuss further issues.  Dark Stool Patient reports history of constipation.  She takes Fleet tablets as needed as well as Pepto-Bismol.  Reports bright red blood after BMs.  She also endorses last week dark stool and this week is turned green.  She does report straining periodically to have a bowel movement.  Denies any blood in stool, no pain on defecation.  No recent weight loss or change in appetite.  Thyroid Medication change Thyroid medication decreased to 154mcg at last visit.  Tolerating medication well.    PERTINENT  PMH / PSH:  COPD Hypothyroid Tobacco use Depression  OBJECTIVE:   BP 110/68   Pulse 86   Ht 5\' 3"  (1.6 m)   Wt 150 lb (68 kg)   SpO2 98%   BMI 26.57 kg/m    General: Alert and oriented, no apparent distress  Cardiovascular: RRR with no murmurs noted Respiratory: CTA bilaterally  Gastrointestinal: Bowel sounds present. No abdominal pain  Psych: Behavior and speech appropriate to situation  ASSESSMENT/PLAN:   Dark stools Patient denies any dizziness no weakness.  Has been having bright red bleeding after BMs ongoing for a while.  Taking Pepto-Bismol and folate tablets. -Hemoglobin 14.1 -Start MiraLAX daily to decrease straining and constipation -Refer to GI consult for colonoscopy   Hypothyroidism Tolerating recent decrease in thyroid medication. -Repeat TSH within normal range -Continue levothyroxine 100 mcg daily -Follow-up PCP 4-6 weeks   Health Maintenance -Mammogram ordered  Carollee Leitz, MD [place]

## 2020-04-23 LAB — TSH: TSH: 2.48 u[IU]/mL (ref 0.450–4.500)

## 2020-04-24 ENCOUNTER — Encounter: Payer: Self-pay | Admitting: Family Medicine

## 2020-04-26 ENCOUNTER — Encounter: Payer: Self-pay | Admitting: Family Medicine

## 2020-04-26 DIAGNOSIS — R195 Other fecal abnormalities: Secondary | ICD-10-CM | POA: Insufficient documentation

## 2020-04-26 NOTE — Assessment & Plan Note (Signed)
Tolerating recent decrease in thyroid medication. -Repeat TSH within normal range -Continue levothyroxine 100 mcg daily -Follow-up PCP 4-6 weeks

## 2020-04-26 NOTE — Assessment & Plan Note (Signed)
Patient denies any dizziness no weakness.  Has been having bright red bleeding after BMs ongoing for a while.  Taking Pepto-Bismol and folate tablets. -Hemoglobin 14.1 -Start MiraLAX daily to decrease straining and constipation -Refer to GI consult for colonoscopy

## 2020-05-05 ENCOUNTER — Ambulatory Visit: Payer: BLUE CROSS/BLUE SHIELD | Attending: Internal Medicine

## 2020-05-05 DIAGNOSIS — Z20822 Contact with and (suspected) exposure to covid-19: Secondary | ICD-10-CM

## 2020-05-06 ENCOUNTER — Other Ambulatory Visit: Payer: Self-pay | Admitting: Family Medicine

## 2020-05-06 DIAGNOSIS — E039 Hypothyroidism, unspecified: Secondary | ICD-10-CM

## 2020-05-06 DIAGNOSIS — Z720 Tobacco use: Secondary | ICD-10-CM

## 2020-05-06 LAB — SARS-COV-2, NAA 2 DAY TAT

## 2020-05-06 LAB — NOVEL CORONAVIRUS, NAA: SARS-CoV-2, NAA: NOT DETECTED

## 2020-05-20 ENCOUNTER — Other Ambulatory Visit: Payer: Self-pay

## 2020-05-20 ENCOUNTER — Ambulatory Visit
Admission: RE | Admit: 2020-05-20 | Discharge: 2020-05-20 | Disposition: A | Discharge status: Home / Self Care | Payer: BLUE CROSS/BLUE SHIELD | Source: Ambulatory Visit | Attending: Family Medicine | Admitting: Family Medicine

## 2020-05-20 DIAGNOSIS — Z0000 Encounter for general adult medical examination without abnormal findings: Secondary | ICD-10-CM

## 2020-05-24 NOTE — Progress Notes (Signed)
Follow up in one year.  Thanks Lavella Lemons

## 2020-06-10 NOTE — Telephone Encounter (Signed)
error 

## 2020-06-14 ENCOUNTER — Telehealth: Payer: Self-pay

## 2020-06-14 NOTE — Telephone Encounter (Signed)
Contacted patient for lung CT screening clinic after receiving referral from Dr. Sherene Sires.  Message left for patient to call Burgess Estelle, lung navigator to schedule CT scan.

## 2020-07-01 ENCOUNTER — Other Ambulatory Visit: Payer: Self-pay

## 2020-07-01 ENCOUNTER — Ambulatory Visit
Admission: EM | Admit: 2020-07-01 | Discharge: 2020-07-01 | Disposition: A | Discharge status: Home / Self Care | Payer: BLUE CROSS/BLUE SHIELD | Attending: Physician Assistant | Admitting: Physician Assistant

## 2020-07-01 DIAGNOSIS — Z8616 Personal history of COVID-19: Secondary | ICD-10-CM | POA: Insufficient documentation

## 2020-07-01 DIAGNOSIS — E785 Hyperlipidemia, unspecified: Secondary | ICD-10-CM | POA: Insufficient documentation

## 2020-07-01 DIAGNOSIS — J441 Chronic obstructive pulmonary disease with (acute) exacerbation: Secondary | ICD-10-CM | POA: Diagnosis not present

## 2020-07-01 DIAGNOSIS — K219 Gastro-esophageal reflux disease without esophagitis: Secondary | ICD-10-CM | POA: Diagnosis not present

## 2020-07-01 DIAGNOSIS — E039 Hypothyroidism, unspecified: Secondary | ICD-10-CM | POA: Diagnosis not present

## 2020-07-01 DIAGNOSIS — Z79890 Hormone replacement therapy (postmenopausal): Secondary | ICD-10-CM | POA: Diagnosis not present

## 2020-07-01 DIAGNOSIS — R05 Cough: Secondary | ICD-10-CM | POA: Insufficient documentation

## 2020-07-01 DIAGNOSIS — R0981 Nasal congestion: Secondary | ICD-10-CM | POA: Insufficient documentation

## 2020-07-01 DIAGNOSIS — Z9049 Acquired absence of other specified parts of digestive tract: Secondary | ICD-10-CM | POA: Insufficient documentation

## 2020-07-01 DIAGNOSIS — Z79899 Other long term (current) drug therapy: Secondary | ICD-10-CM | POA: Insufficient documentation

## 2020-07-01 DIAGNOSIS — Z20822 Contact with and (suspected) exposure to covid-19: Secondary | ICD-10-CM | POA: Insufficient documentation

## 2020-07-01 DIAGNOSIS — F419 Anxiety disorder, unspecified: Secondary | ICD-10-CM | POA: Insufficient documentation

## 2020-07-01 DIAGNOSIS — R059 Cough, unspecified: Secondary | ICD-10-CM

## 2020-07-01 DIAGNOSIS — F17210 Nicotine dependence, cigarettes, uncomplicated: Secondary | ICD-10-CM | POA: Diagnosis not present

## 2020-07-01 MED ORDER — INCRUSE ELLIPTA 62.5 MCG/INH IN AEPB: 1.0000 | INHALATION_SPRAY | Freq: Once | RESPIRATORY_TRACT | 1 refills | Status: AC

## 2020-07-01 MED ORDER — PREDNISONE 20 MG PO TABS: 40.0000 mg | ORAL_TABLET | Freq: Every day | ORAL | 0 refills | Status: AC

## 2020-07-01 MED ORDER — DOXYCYCLINE HYCLATE 100 MG PO CAPS: 100.0000 mg | ORAL_CAPSULE | Freq: Two times a day (BID) | ORAL | 0 refills | Status: AC

## 2020-07-01 NOTE — ED Provider Notes (Signed)
[facility]    CSN: [id number] Arrival date & time: 07/01/20  1110      History   Chief Complaint Chief Complaint  Patient presents with  . Cough    HPI Helen Jones is a 54 y.o. female presenting for 2-week history of productive cough and nasal congestion.  She says both the cough and nasal congestion are dark green in coloration.  She says that over the past 5 days she has had a low-grade fever and diarrhea.  Patient denies known Covid exposure, but would like to be tested today.  Patient admits to previous history of Covid in May 2021.  Patient medical history significant for COPD with current tobacco use.  Patient says she has an albuterol inhaler as needed for shortness of breath related to her COPD.  She says she used to be on a different inhaler, but does not remember what it is.  She has been out of that for about a month.  Patient currently denies any chest discomfort or shortness of breath.  She denies sinus pain, but admits to headaches.  She has been taking over-the-counter Mucinex and using nasal spray without much relief in symptoms.  She says she believes she is feeling worse.  No other complaints or concerns.  HPI  Past Medical History:  Diagnosis Date  . Anxiety   . Depression   . Eczema   . GERD (gastroesophageal reflux disease)   . Hypothyroid     Patient Active Problem List   Diagnosis Date Noted  . Dark stools 04/26/2020  . Hyperlipemia 03/25/2020  . Chronic obstructive pulmonary disease ([place]) 03/25/2020  . Encounter for screening for lung cancer 07/07/2019  . Hypernatremia 06/17/2017  . Fatigue 05/31/2017  . Psoriasis of scalp 05/31/2017  . Special screening for malignant neoplasms, colon   . Benign neoplasm of ascending colon   . Benign neoplasm of transverse colon   . Benign neoplasm of sigmoid colon   . Allergic rhinitis 12/25/2013  . Overweight 04/20/2011  . Eczema 04/20/2011  . VAGINITIS, ATROPHIC, POSTMENOPAUSAL 08/16/2010  .  OSTEOARTHRITIS, SACROILIAC JOINT 10/20/2007  . MIGRAINE NOS W/O INTRACTABLE MIGRAINE 05/12/2007  . Anxiety state 01/13/2007  . GERD 01/13/2007  . Hypothyroidism 12/05/2006  . Major depressive disorder, recurrent episode ([place]) 12/05/2006  . Tobacco abuse 12/05/2006    Past Surgical History:  Procedure Laterality Date  . CHOLECYSTECTOMY    . COLONOSCOPY WITH PROPOFOL N/A 09/27/2015   Procedure: COLONOSCOPY WITH PROPOFOL;  Surgeon: [name], MD;  Location: [facility] ENDOSCOPY;  Service: Endoscopy;  Laterality: N/A;  . TUBAL LIGATION      OB History   No obstetric history on file.      Home Medications    Prior to Admission medications   Medication Sig Start Date End Date Taking? Authorizing Provider  albuterol (VENTOLIN HFA) 108 (90 Base) MCG/ACT inhaler INHALE 2 PUFFS INTO THE LUNGS EVERY 4 (FOUR) HOURS AS NEEDED FOR WHEEZING OR SHORTNESS OF BREATH. 03/25/20  Yes Bland, Scott, DO  buPROPion (WELLBUTRIN) 75 MG tablet TAKE 1 TABLET BY MOUTH TWICE A DAY 05/09/20  Yes [name], [name], DO  Calcium Carbonate-Vitamin D3 (CALCIUM 600/VITAMIN D) 600-400 MG-UNIT TABS Take 2 tablets by mouth daily with meal. 06/05/17  Yes Gonfa, Taye T, MD  coal tar-salicylic acid 2 % shampoo Apply topically daily as needed for itching. 03/25/20  Yes Bland, Scott, DO  fexofenadine (ALLEGRA) 180 MG tablet Take 1 tablet (180 mg total) by mouth daily. 02/04/15  Yes  Mariel Aloe, MD  hydrocortisone cream 1 % Apply 1 application topically 2 (two) times daily. 02/23/16  Yes Rosemarie Ax, MD  levothyroxine (SYNTHROID) 100 MCG tablet TAKE 1 TABLET BY MOUTH EVERY DAY BEFORE BREAKFAST 05/09/20  Yes [name], [name], DO  nicotine polacrilex (NICORETTE) 2 MG gum Take 1 each (2 mg total) by mouth as needed for smoking cessation. 07/06/19  Yes [name], MD  Olopatadine HCl (PATADAY) 0.2 % SOLN Apply 1 drop to eye daily. 05/31/17  Yes [name], MD  omeprazole (PRILOSEC) 20 MG capsule TAKE 1 CAPSULE BY MOUTH EVERY DAY 07/06/19   Yes [name], MD  pravastatin (PRAVACHOL) 40 MG tablet Take 1 tablet (40 mg total) by mouth daily. Please schedule office visit before next refill. 11/09/19  Yes Winfrey, Alcario Drought, MD  vitamin B-12 (CYANOCOBALAMIN) 1000 MCG tablet Take 1,000 mcg by mouth daily.   Yes [provider]  doxycycline (VIBRAMYCIN) 100 MG capsule Take 1 capsule (100 mg total) by mouth 2 (two) times daily for 7 days. 07/01/20 07/08/20  Laurene Footman B, PA-C  escitalopram (LEXAPRO) 10 MG tablet Take 1 tablet (10 mg total) by mouth daily. 07/06/19   [name], MD  fluticasone (FLONASE) 50 MCG/ACT nasal spray Place 2 sprays into both nostrils daily. 05/31/17   [name], MD  nicotine (NICODERM CQ - DOSED IN MG/24 HOURS) 14 mg/24hr patch Place 1 patch (14 mg total) onto the skin daily. 07/06/19   [name], MD  predniSONE (DELTASONE) 20 MG tablet Take 2 tablets (40 mg total) by mouth daily for 5 days. 07/01/20 07/06/20  Laurene Footman B, PA-C  umeclidinium bromide (INCRUSE ELLIPTA) 62.5 MCG/INH AEPB Inhale 1 puff into the lungs once for 1 dose. 07/01/20 07/01/20  Danton Clap, PA-C    Family History Family History  Problem Relation Age of Onset  . Stomach cancer Other   . Colitis Other   . Diabetes Other     Social History Social History   Tobacco Use  . Smoking status: Current Every Day Smoker    Packs/day: 0.50    Types: Cigarettes    Start date: 10/08/1977  . Smokeless tobacco: Never Used  . Tobacco comment: Previous 2 ppd smoker.    Substance Use Topics  . Alcohol use: No    Alcohol/week: 0.0 standard drinks  . Drug use: No     Allergies   Patient has no known allergies.   Review of Systems Review of Systems  Constitutional: Positive for fatigue and fever. Negative for chills and diaphoresis.  HENT: Positive for congestion. Negative for ear pain, rhinorrhea, sinus pressure, sinus pain and sore throat.   Respiratory: Positive for cough. Negative for shortness of breath and  wheezing.   Cardiovascular: Negative for chest pain.  Gastrointestinal: Positive for diarrhea. Negative for abdominal pain, nausea and vomiting.  Musculoskeletal: Negative for arthralgias and myalgias.  Skin: Negative for rash.  Neurological: Negative for weakness and headaches.  Hematological: Negative for adenopathy.     Physical Exam Triage Vital Signs ED Triage Vitals  Enc Vitals Group     BP --      Pulse Rate 07/01/20 1131 71     Resp 07/01/20 1131 18     Temp 07/01/20 1131 97.8 F (36.6 C)     Temp Source 07/01/20 1131 Oral     SpO2 --      Weight --      Height --  Head Circumference --      Peak Flow --      Pain Score 07/01/20 1132 0     Pain Loc --      Pain Edu? --      Excl. in [place]? --    No data found.  Updated Vital Signs BP 116/79 (BP Location: Left Arm)   Pulse 71   Temp 97.8 F (36.6 C) (Oral)   Resp 18   Ht 5\' 3"  (1.6 m)   Wt 150 lb (68 kg)   SpO2 98%   BMI 26.57 kg/m        Physical Exam Vitals and nursing note reviewed.  Constitutional:      General: She is not in acute distress.    Appearance: Normal appearance. She is not ill-appearing or toxic-appearing.  HENT:     Head: Normocephalic and atraumatic.     Nose: Congestion and rhinorrhea present.     Mouth/Throat:     Mouth: Mucous membranes are moist.     Pharynx: Oropharynx is clear. Posterior oropharyngeal erythema (with clear PND) present.  Eyes:     General: No scleral icterus.       Right eye: No discharge.        Left eye: No discharge.     Conjunctiva/sclera: Conjunctivae normal.  Cardiovascular:     Rate and Rhythm: Normal rate and regular rhythm.     Heart sounds: Normal heart sounds.  Pulmonary:     Effort: Pulmonary effort is normal. No respiratory distress.     Breath sounds: Normal breath sounds. No wheezing, rhonchi or rales.  Musculoskeletal:     Cervical back: Neck supple.  Skin:    General: Skin is dry.  Neurological:     General: No focal deficit  present.     Mental Status: She is alert. Mental status is at baseline.     Motor: No weakness.     Gait: Gait normal.  Psychiatric:        Mood and Affect: Mood normal.        Behavior: Behavior normal.        Thought Content: Thought content normal.      UC Treatments / Results  Labs (all labs ordered are listed, but only abnormal results are displayed) Labs Reviewed  SARS CORONAVIRUS 2 (TAT 6-24 HRS)    EKG   Radiology No results found.  Procedures Procedures (including critical care time)  Medications Ordered in UC Medications - No data to display  Initial Impression / Assessment and Plan / UC Course  I have reviewed the triage vital signs and the nursing notes.  Pertinent labs & imaging results that were available during my care of the patient were reviewed by me and considered in my medical decision making (see chart for details).   54 year old female presenting for 2-week history of worsening cough and nasal congestion.  She does have COPD.  All vital signs are normal and stable and her chest is clear to auscultation.  Treating at this time for COPD exacerbation with doxycycline and prednisone.  I was able to look back in her records and find that she was on the Ellipta inhaler and I have refilled that.  Covid testing obtained and advised patient how to find results.  Discussed CDC guidelines, isolation protocol and ED precautions if positive.  She said that she did do the antibody infusion a few months ago when she had Covid and would be interested in doing that  again if she test positive again.  Advised patient we will reach out to her at that is the case.  Otherwise she should rest, increase fluid intake and continue Mucinex.  Follow-up with [place] as needed.  Make a follow-up with pulmonologist as well.  Final Clinical Impressions(s) / UC Diagnoses   Final diagnoses:  COPD exacerbation (HCC)  Cough  Nasal congestion     Discharge Instructions     We are  treating at this time for COPD exacerbation.  Begin prednisone and doxycycline as prescribed.  I was able to find the inhaler that you use to use and have prescribed that.  Please make a follow-up with your pulmonologist or PCP for refills.  You have received COVID testing today either for positive exposure, concerning symptoms that could be related to COVID infection, screening purposes, or re-testing after confirmed positive.  Your test obtained today checks for active viral infection in the last 1-2 weeks. If your test is negative now, you can still test positive later. So, if you do develop symptoms you should either get re-tested and/or isolate x 10 days. Please follow CDC guidelines.  While Rapid antigen tests come back in 15-20 minutes, send out PCR/molecular test results typically come back within 24 hours. In the mean time, if you are symptomatic, assume this could be a positive test and treat/monitor yourself as if you do have COVID.   We will call with test results. Please download the MyChart app and set up a profile to access test results.   If symptomatic, go home and rest. Push fluids. Take Tylenol as needed for discomfort. Gargle warm salt water. Throat lozenges. Take Mucinex DM or Robitussin for cough. Humidifier in bedroom to ease coughing. Warm showers. Also review the COVID handout for more information.  COVID-19 INFECTION: The incubation period of COVID-19 is approximately 14 days after exposure, with most symptoms developing in roughly 4-5 days. Symptoms may range in severity from mild to critically severe. Roughly 80% of those infected will have mild symptoms. People of any age may become infected with COVID-19 and have the ability to transmit the virus. The most common symptoms include: fever, fatigue, cough, body aches, headaches, sore throat, nasal congestion, shortness of breath, nausea, vomiting, diarrhea, changes in smell and/or taste.    COURSE OF ILLNESS Some patients may  begin with mild disease which can progress quickly into critical symptoms. If your symptoms are worsening please call ahead to the Emergency Department and proceed there for further treatment. Recovery time appears to be roughly 1-2 weeks for mild symptoms and 3-6 weeks for severe disease.   GO IMMEDIATELY TO ER FOR FEVER YOU ARE UNABLE TO GET DOWN WITH TYLENOL, BREATHING PROBLEMS, CHEST PAIN, FATIGUE, LETHARGY, INABILITY TO EAT OR DRINK, ETC  QUARANTINE AND ISOLATION: To help decrease the spread of COVID-19 please remain isolated if you have COVID infection or are highly suspected to have COVID infection. This means -stay home and isolate to one room in the home if you live with others. Do not share a bed or bathroom with others while ill, sanitize and wipe down all countertops and keep common areas clean and disinfected. You may discontinue isolation if you have a mild case and are asymptomatic 10 days after symptom onset as long as you have been fever free >24 hours without having to take Motrin or Tylenol. If your case is more severe (meaning you develop pneumonia or are admitted in the hospital), you may have to isolate longer.  If you have been in close contact (within 6 feet) of someone diagnosed with COVID 19, you are advised to quarantine in your home for 14 days as symptoms can develop anywhere from 2-14 days after exposure to the virus. If you develop symptoms, you  must isolate.  Most current guidelines for COVID after exposure -isolate 10 days if you ARE NOT tested for COVID as long as symptoms do not develop -isolate 7 days if you are tested and remain asymptomatic -You do not necessarily need to be tested for COVID if you have + exposure and        develop   symptoms. Just isolate at home x10 days from symptom onset During this global pandemic, CDC advises to practice social distancing, try to stay at least 7ft away from others at all times. Wear a face covering. Wash and sanitize your  hands regularly and avoid going anywhere that is not necessary.  KEEP IN MIND THAT THE COVID TEST IS NOT 100% ACCURATE AND YOU SHOULD STILL DO EVERYTHING TO PREVENT POTENTIAL SPREAD OF VIRUS TO OTHERS (WEAR MASK, WEAR GLOVES, [place] HANDS AND SANITIZE REGULARLY). IF INITIAL TEST IS NEGATIVE, THIS MAY NOT MEAN YOU ARE DEFINITELY NEGATIVE. MOST ACCURATE TESTING IS DONE 5-7 DAYS AFTER EXPOSURE.   It is not advised by CDC to get re-tested after receiving a positive COVID test since you can still test positive for weeks to months after you have already cleared the virus.   *If you have not been vaccinated for COVID, I strongly suggest you consider getting vaccinated as long as there are no contraindications.      ED Prescriptions    Medication Sig Dispense Auth. Provider   umeclidinium bromide (INCRUSE ELLIPTA) 62.5 MCG/INH AEPB Inhale 1 puff into the lungs once for 1 dose. 1 each Danton Clap, PA-C   doxycycline (VIBRAMYCIN) 100 MG capsule Take 1 capsule (100 mg total) by mouth 2 (two) times daily for 7 days. 14 capsule Laurene Footman B, PA-C   predniSONE (DELTASONE) 20 MG tablet Take 2 tablets (40 mg total) by mouth daily for 5 days. 10 tablet Gretta Cool     PDMP not reviewed this encounter.   Danton Clap, PA-C 07/01/20 1157

## 2020-07-01 NOTE — ED Triage Notes (Signed)
Patient states that she has been having a cough with nasal congestion x 2 weeks. States that she started running a fever and having diarrhea 5 days ago. Reports that she did have covid in may but would like to be tested today.

## 2020-07-01 NOTE — Discharge Instructions (Signed)
We are treating at this time for COPD exacerbation.  Begin prednisone and doxycycline as prescribed.  I was able to find the inhaler that you use to use and have prescribed that.  Please make a follow-up with your pulmonologist or PCP for refills.  You have received COVID testing today either for positive exposure, concerning symptoms that could be related to COVID infection, screening purposes, or re-testing after confirmed positive.  Your test obtained today checks for active viral infection in the last 1-2 weeks. If your test is negative now, you can still test positive later. So, if you do develop symptoms you should either get re-tested and/or isolate x 10 days. Please follow CDC guidelines.  While Rapid antigen tests come back in 15-20 minutes, send out PCR/molecular test results typically come back within 24 hours. In the mean time, if you are symptomatic, assume this could be a positive test and treat/monitor yourself as if you do have COVID.   We will call with test results. Please download the MyChart app and set up a profile to access test results.   If symptomatic, go home and rest. Push fluids. Take Tylenol as needed for discomfort. Gargle warm salt water. Throat lozenges. Take Mucinex DM or Robitussin for cough. Humidifier in bedroom to ease coughing. Warm showers. Also review the COVID handout for more information.  COVID-19 INFECTION: The incubation period of COVID-19 is approximately 14 days after exposure, with most symptoms developing in roughly 4-5 days. Symptoms may range in severity from mild to critically severe. Roughly 80% of those infected will have mild symptoms. People of any age may become infected with COVID-19 and have the ability to transmit the virus. The most common symptoms include: fever, fatigue, cough, body aches, headaches, sore throat, nasal congestion, shortness of breath, nausea, vomiting, diarrhea, changes in smell and/or taste.    COURSE OF ILLNESS Some  patients may begin with mild disease which can progress quickly into critical symptoms. If your symptoms are worsening please call ahead to the Emergency Department and proceed there for further treatment. Recovery time appears to be roughly 1-2 weeks for mild symptoms and 3-6 weeks for severe disease.   GO IMMEDIATELY TO ER FOR FEVER YOU ARE UNABLE TO GET DOWN WITH TYLENOL, BREATHING PROBLEMS, CHEST PAIN, FATIGUE, LETHARGY, INABILITY TO EAT OR DRINK, ETC  QUARANTINE AND ISOLATION: To help decrease the spread of COVID-19 please remain isolated if you have COVID infection or are highly suspected to have COVID infection. This means -stay home and isolate to one room in the home if you live with others. Do not share a bed or bathroom with others while ill, sanitize and wipe down all countertops and keep common areas clean and disinfected. You may discontinue isolation if you have a mild case and are asymptomatic 10 days after symptom onset as long as you have been fever free >24 hours without having to take Motrin or Tylenol. If your case is more severe (meaning you develop pneumonia or are admitted in the hospital), you may have to isolate longer.   If you have been in close contact (within 6 feet) of someone diagnosed with COVID 19, you are advised to quarantine in your home for 14 days as symptoms can develop anywhere from 2-14 days after exposure to the virus. If you develop symptoms, you  must isolate.  Most current guidelines for COVID after exposure -isolate 10 days if you ARE NOT tested for COVID as long as symptoms do not develop -isolate  7 days if you are tested and remain asymptomatic -You do not necessarily need to be tested for COVID if you have + exposure and        develop   symptoms. Just isolate at home x10 days from symptom onset During this global pandemic, CDC advises to practice social distancing, try to stay at least 11ft away from others at all times. Wear a face covering. Wash and  sanitize your hands regularly and avoid going anywhere that is not necessary.  KEEP IN MIND THAT THE COVID TEST IS NOT 100% ACCURATE AND YOU SHOULD STILL DO EVERYTHING TO PREVENT POTENTIAL SPREAD OF VIRUS TO OTHERS (WEAR MASK, WEAR GLOVES, [place] HANDS AND SANITIZE REGULARLY). IF INITIAL TEST IS NEGATIVE, THIS MAY NOT MEAN YOU ARE DEFINITELY NEGATIVE. MOST ACCURATE TESTING IS DONE 5-7 DAYS AFTER EXPOSURE.   It is not advised by CDC to get re-tested after receiving a positive COVID test since you can still test positive for weeks to months after you have already cleared the virus.   *If you have not been vaccinated for COVID, I strongly suggest you consider getting vaccinated as long as there are no contraindications.

## 2020-07-02 LAB — SARS CORONAVIRUS 2 (TAT 6-24 HRS): SARS Coronavirus 2: NEGATIVE

## 2020-07-11 ENCOUNTER — Telehealth: Payer: Self-pay

## 2020-07-11 NOTE — Telephone Encounter (Signed)
Contacted patient again for lung CT screening clinic.  Message left for patient to call Burgess Estelle, lung navigator to schedule CT scan.

## 2020-07-25 ENCOUNTER — Encounter: Payer: Self-pay | Admitting: *Deleted

## 2020-11-09 ENCOUNTER — Other Ambulatory Visit: Payer: Self-pay

## 2020-11-09 ENCOUNTER — Ambulatory Visit
Admission: EM | Admit: 2020-11-09 | Discharge: 2020-11-09 | Disposition: A | Discharge status: Home / Self Care | Payer: BLUE CROSS/BLUE SHIELD | Attending: Sports Medicine | Admitting: Sports Medicine

## 2020-11-09 DIAGNOSIS — R197 Diarrhea, unspecified: Secondary | ICD-10-CM | POA: Insufficient documentation

## 2020-11-09 DIAGNOSIS — R519 Headache, unspecified: Secondary | ICD-10-CM | POA: Insufficient documentation

## 2020-11-09 DIAGNOSIS — R059 Cough, unspecified: Secondary | ICD-10-CM | POA: Insufficient documentation

## 2020-11-09 DIAGNOSIS — R42 Dizziness and giddiness: Secondary | ICD-10-CM | POA: Insufficient documentation

## 2020-11-09 DIAGNOSIS — F17210 Nicotine dependence, cigarettes, uncomplicated: Secondary | ICD-10-CM | POA: Diagnosis not present

## 2020-11-09 DIAGNOSIS — R110 Nausea: Secondary | ICD-10-CM | POA: Diagnosis not present

## 2020-11-09 DIAGNOSIS — J111 Influenza due to unidentified influenza virus with other respiratory manifestations: Secondary | ICD-10-CM | POA: Diagnosis not present

## 2020-11-09 DIAGNOSIS — Z20822 Contact with and (suspected) exposure to covid-19: Secondary | ICD-10-CM | POA: Insufficient documentation

## 2020-11-09 HISTORY — DX: Chronic obstructive pulmonary disease, unspecified: J44.9

## 2020-11-09 LAB — SARS CORONAVIRUS 2 (TAT 6-24 HRS): SARS Coronavirus 2: NEGATIVE

## 2020-11-09 NOTE — ED Triage Notes (Addendum)
Pt has c/o headaches, cough and nasal congestion, body aches, dizziness and nausea. Pt states symptoms started beginning of last week. Pt states she feels she is getting worse. Pt denies any known sick contacts. Pt does have COPD.

## 2020-11-09 NOTE — Discharge Instructions (Addendum)
Your COVID-19 test is pending on discharge. Please check the status of your test on MyChart which you can log into from home from an app on your phone. Please isolate and await the results of your test.  If you are positive you will need to quarantine per the current CDC guidelines. A work note was provided.  I hope you get to feeling better.  Take care, Dr. Drema Dallas

## 2020-11-09 NOTE — ED Provider Notes (Signed)
[facility]    CSN: [id number] Arrival date & time: 11/09/20  0803      History   Chief Complaint Chief Complaint  Patient presents with  . Cough  . Dizziness  . Generalized Body Aches         HPI Helen BENZING is a 55 y.o. female.   Pleasant 55 year old female who presents for evaluation of the above issues.  She reports her symptoms began about 10 days ago with headache and congestion and a mild nonproductive cough.  It progressed and she is feeling more fatigued and has some intermittent dizziness some myalgias some nausea and some loose stools.  She is also noted some chills.  No documented fevers.  No vomiting.  No chest pain or shortness of breath.  No abdominal symptoms or urinary symptoms.  She denies sick contacts.  She has not been vaccinated against COVID or influenza.  She had COVID back in May 2021.  No red flag signs or symptoms elicited on history.     Past Medical History:  Diagnosis Date  . Anxiety   . COPD (chronic obstructive pulmonary disease) ([place])   . Depression   . Eczema   . GERD (gastroesophageal reflux disease)   . Hypothyroid     Patient Active Problem List   Diagnosis Date Noted  . Dark stools 04/26/2020  . Hyperlipemia 03/25/2020  . Chronic obstructive pulmonary disease ([place]) 03/25/2020  . Encounter for screening for lung cancer 07/07/2019  . Hypernatremia 06/17/2017  . Fatigue 05/31/2017  . Psoriasis of scalp 05/31/2017  . Special screening for malignant neoplasms, colon   . Benign neoplasm of ascending colon   . Benign neoplasm of transverse colon   . Benign neoplasm of sigmoid colon   . Allergic rhinitis 12/25/2013  . Overweight 04/20/2011  . Eczema 04/20/2011  . VAGINITIS, ATROPHIC, POSTMENOPAUSAL 08/16/2010  . OSTEOARTHRITIS, SACROILIAC JOINT 10/20/2007  . MIGRAINE NOS W/O INTRACTABLE MIGRAINE 05/12/2007  . Anxiety state 01/13/2007  . GERD 01/13/2007  . Hypothyroidism 12/05/2006  . Major depressive  disorder, recurrent episode ([place]) 12/05/2006  . Tobacco abuse 12/05/2006    Past Surgical History:  Procedure Laterality Date  . CHOLECYSTECTOMY    . COLONOSCOPY WITH PROPOFOL N/A 09/27/2015   Procedure: COLONOSCOPY WITH PROPOFOL;  Surgeon: [name], MD;  Location: [facility] ENDOSCOPY;  Service: Endoscopy;  Laterality: N/A;  . TUBAL LIGATION      OB History   No obstetric history on file.      Home Medications    Prior to Admission medications   Medication Sig Start Date End Date Taking? Authorizing Provider  albuterol (VENTOLIN HFA) 108 (90 Base) MCG/ACT inhaler INHALE 2 PUFFS INTO THE LUNGS EVERY 4 (FOUR) HOURS AS NEEDED FOR WHEEZING OR SHORTNESS OF BREATH. 03/25/20  Yes Bland, Scott, DO  buPROPion (WELLBUTRIN) 75 MG tablet TAKE 1 TABLET BY MOUTH TWICE A DAY 05/09/20  Yes [name], [name], DO  Calcium Carbonate-Vitamin D3 (CALCIUM 600/VITAMIN D) 600-400 MG-UNIT TABS Take 2 tablets by mouth daily with meal. 06/05/17  Yes Gonfa, Taye T, MD  hydrocortisone cream 1 % Apply 1 application topically 2 (two) times daily. 02/23/16  Yes Rosemarie Ax, MD  levothyroxine (SYNTHROID) 100 MCG tablet TAKE 1 TABLET BY MOUTH EVERY DAY BEFORE BREAKFAST 05/09/20  Yes [name], [name], DO  Olopatadine HCl (PATADAY) 0.2 % SOLN Apply 1 drop to eye daily. 05/31/17  Yes [name], MD  omeprazole (PRILOSEC) 20 MG capsule TAKE 1 CAPSULE BY MOUTH EVERY  DAY 07/06/19  Yes [name], MD  pravastatin (PRAVACHOL) 40 MG tablet Take 1 tablet (40 mg total) by mouth daily. Please schedule office visit before next refill. 11/09/19  Yes Winfrey, Alcario Drought, MD  vitamin B-12 (CYANOCOBALAMIN) 1000 MCG tablet Take 1,000 mcg by mouth daily.   Yes [provider]  coal tar-salicylic acid 2 % shampoo Apply topically daily as needed for itching. 03/25/20   Sherene Sires, DO  escitalopram (LEXAPRO) 10 MG tablet Take 1 tablet (10 mg total) by mouth daily. 07/06/19   [name], MD  fexofenadine (ALLEGRA) 180 MG tablet Take 1  tablet (180 mg total) by mouth daily. 02/04/15   Mariel Aloe, MD  fluticasone (FLONASE) 50 MCG/ACT nasal spray Place 2 sprays into both nostrils daily. 05/31/17   [name], MD  nicotine (NICODERM CQ - DOSED IN MG/24 HOURS) 14 mg/24hr patch Place 1 patch (14 mg total) onto the skin daily. 07/06/19   [name], MD  nicotine polacrilex (NICORETTE) 2 MG gum Take 1 each (2 mg total) by mouth as needed for smoking cessation. 07/06/19   [name], MD    Family History Family History  Problem Relation Age of Onset  . Stomach cancer Other   . Colitis Other   . Diabetes Other     Social History Social History   Tobacco Use  . Smoking status: Current Every Day Smoker    Packs/day: 0.50    Types: Cigarettes    Start date: 10/08/1977  . Smokeless tobacco: Never Used  . Tobacco comment: Previous 2 ppd smoker.    Vaping Use  . Vaping Use: Never used  Substance Use Topics  . Alcohol use: No    Alcohol/week: 0.0 standard drinks  . Drug use: No     Allergies   Patient has no known allergies.   Review of Systems Review of Systems  Constitutional: Positive for chills and fatigue. Negative for diaphoresis and fever.  HENT: Positive for congestion. Negative for ear discharge, ear pain, postnasal drip, rhinorrhea, sinus pressure, sinus pain and sore throat.   Eyes: Negative for pain.  Respiratory: Positive for cough. Negative for chest tightness, shortness of breath, wheezing and stridor.   Cardiovascular: Negative for chest pain and palpitations.  Gastrointestinal: Positive for nausea. Negative for abdominal pain, constipation and diarrhea.  Genitourinary: Negative for dysuria.  Musculoskeletal: Positive for myalgias.  Skin: Negative for color change, pallor, rash and wound.  Neurological: Positive for dizziness, light-headedness and headaches. Negative for tremors, seizures, syncope and weakness.  All other systems reviewed and are negative.    Physical Exam Triage  Vital Signs ED Triage Vitals  Enc Vitals Group     BP 11/09/20 0825 121/79     Pulse Rate 11/09/20 0825 72     Resp 11/09/20 0825 18     Temp 11/09/20 0825 98.3 F (36.8 C)     Temp Source 11/09/20 0825 Oral     SpO2 11/09/20 0825 99 %     Weight 11/09/20 0821 150 lb (68 kg)     Height 11/09/20 0821 5\' 2"  (1.575 m)     Head Circumference --      Peak Flow --      Pain Score 11/09/20 0821 0     Pain Loc --      Pain Edu? --      Excl. in [place]? --    No data found.  Updated Vital Signs BP 121/79 (BP  Location: Left Arm)   Pulse 72   Temp 98.3 F (36.8 C) (Oral)   Resp 18   Ht 5\' 2"  (1.575 m)   Wt 68 kg   SpO2 99%   BMI 27.44 kg/m   Visual Acuity Right Eye Distance:   Left Eye Distance:   Bilateral Distance:    Right Eye Near:   Left Eye Near:    Bilateral Near:     Physical Exam Vitals and nursing note reviewed.  Constitutional:      General: She is not in acute distress.    Appearance: Normal appearance. She is not ill-appearing.  HENT:     Head: Normocephalic and atraumatic.     Right Ear: Tympanic membrane normal.     Left Ear: Tympanic membrane normal.     Nose: Nose normal. No congestion or rhinorrhea.     Mouth/Throat:     Mouth: Mucous membranes are moist.     Pharynx: Posterior oropharyngeal erythema present. No oropharyngeal exudate.  Eyes:     Extraocular Movements: Extraocular movements intact.     Conjunctiva/sclera: Conjunctivae normal.     Pupils: Pupils are equal, round, and reactive to light.  Cardiovascular:     Rate and Rhythm: Normal rate and regular rhythm.     Pulses: Normal pulses.     Heart sounds: Normal heart sounds. No murmur heard. No friction rub. No gallop.   Pulmonary:     Effort: Pulmonary effort is normal. No respiratory distress.     Breath sounds: Normal breath sounds. No stridor. No wheezing, rhonchi or rales.  Musculoskeletal:     Cervical back: Normal range of motion and neck supple. No rigidity.  Lymphadenopathy:      Cervical: Cervical adenopathy present.  Skin:    General: Skin is warm and dry.     Capillary Refill: Capillary refill takes less than 2 seconds.  Neurological:     General: No focal deficit present.     Mental Status: She is alert and oriented to person, place, and time.      UC Treatments / Results  Labs (all labs ordered are listed, but only abnormal results are displayed) Labs Reviewed  SARS CORONAVIRUS 2 (TAT 6-24 HRS)    EKG   Radiology No results found.  Procedures Procedures (including critical care time)  Medications Ordered in UC Medications - No data to display  Initial Impression / Assessment and Plan / UC Course  I have reviewed the triage vital signs and the nursing notes.  Pertinent labs & imaging results that were available during my care of the patient were reviewed by me and considered in my medical decision making (see chart for details).     Clinical impression: 10 days of flulike symptoms.  Patient has not been vaccinated against COVID.  Clinically stable and vital signs are stable.  Treatment plan: 1.  The findings and treatment plan were discussed in detail with the patient.  Patient was in agreement. 2.  Recommended getting a COVID test.  We will send that to the hospital.  I will take 6 to 24 hours to return.  I explained this to the patient in detail. 3.  We will give her a work note just keeping her out of work until Friday, February 4.  If she is negative on her test she can return to work that day.  If she is positive someone will contact her and she will go from isolation to quarantine per the current CDC  guidelines. 4.  Gave her educational handouts. 5.  Supportive care, plenty of rest, plenty of fluids, Tylenol or Motrin for fever discomfort and isolate at home. 6.  Over-the-counter meds as needed. 7.  Follow-up here as needed. Final Clinical Impressions(s) / UC Diagnoses   Final diagnoses:  Influenza-like illness  Cough   Dizziness  Acute nonintractable headache, unspecified headache type     Discharge Instructions     Your COVID-19 test is pending on discharge. Please check the status of your test on MyChart which you can log into from home from an app on your phone. Please isolate and await the results of your test.  If you are positive you will need to quarantine per the current CDC guidelines. A work note was provided.  I hope you get to feeling better.  Take care, Dr. Drema Dallas    ED Prescriptions    None     PDMP not reviewed this encounter.   Verda Cumins, MD 11/09/20 [phone]

## 2020-11-10 ENCOUNTER — Other Ambulatory Visit: Payer: Self-pay | Admitting: *Deleted

## 2020-11-11 NOTE — Telephone Encounter (Signed)
Would greatly appreciate assistance with scheduling appointment, patient needs to be seen before further refills. Thank you

## 2020-11-12 ENCOUNTER — Encounter: Payer: Self-pay | Admitting: *Deleted

## 2020-11-12 NOTE — Telephone Encounter (Signed)
Mychart message sent. Lc Joynt, CMA ° °

## 2020-11-15 NOTE — Telephone Encounter (Signed)
Attempted to reach pt no answer. LVM for pt to call the office to make appt for follow up and future medications. Salvatore Marvel, CMA

## 2020-11-19 ENCOUNTER — Other Ambulatory Visit: Payer: Self-pay | Admitting: Family Medicine

## 2020-11-19 DIAGNOSIS — Z720 Tobacco use: Secondary | ICD-10-CM

## 2020-11-24 ENCOUNTER — Telehealth: Payer: Self-pay

## 2020-11-24 NOTE — Telephone Encounter (Signed)
Patient calls nurse line regarding chest tightness and pain on left side of chest. Patient states that she feels this is a COPD exacerbation.  Patient reports that this pain is intermittent and has been going on for one to two weeks. Patient reports that pain is improved after using inhalers. Patient states that she is not having pain currently.   Patient had previously been scheduled for ATC appointment tomorrow morning. Provided patient with strict ED precautions.   Spoke with Dr. Larae Grooms regarding patient, who is agreeable with current plan.   Talbot Grumbling, RN

## 2020-11-25 ENCOUNTER — Ambulatory Visit (INDEPENDENT_AMBULATORY_CARE_PROVIDER_SITE_OTHER): Payer: BLUE CROSS/BLUE SHIELD | Admitting: Family Medicine

## 2020-11-25 ENCOUNTER — Other Ambulatory Visit: Payer: Self-pay

## 2020-11-25 VITALS — BP 117/75 | HR 76 | Ht 62.0 in | Wt 155.6 lb

## 2020-11-25 DIAGNOSIS — J449 Chronic obstructive pulmonary disease, unspecified: Secondary | ICD-10-CM

## 2020-11-25 DIAGNOSIS — K219 Gastro-esophageal reflux disease without esophagitis: Secondary | ICD-10-CM | POA: Diagnosis not present

## 2020-11-25 DIAGNOSIS — L409 Psoriasis, unspecified: Secondary | ICD-10-CM

## 2020-11-25 MED ORDER — FAMOTIDINE 20 MG PO TABS: 20.0000 mg | ORAL_TABLET | Freq: Every day | ORAL | 3 refills | Status: DC

## 2020-11-25 MED ORDER — PRAVASTATIN SODIUM 40 MG PO TABS: 40.0000 mg | ORAL_TABLET | Freq: Every day | ORAL | 3 refills | Status: DC

## 2020-11-25 MED ORDER — HYDROCORTISONE-ACETIC ACID 1-2 % OT SOLN: 3.0000 [drp] | Freq: Two times a day (BID) | OTIC | 0 refills | Status: DC

## 2020-11-25 MED ORDER — SELENIUM SULFIDE 2.5 % EX LOTN: 1.0000 "application " | TOPICAL_LOTION | Freq: Every day | CUTANEOUS | 12 refills | Status: DC | PRN

## 2020-11-25 MED ORDER — ALBUTEROL SULFATE HFA 108 (90 BASE) MCG/ACT IN AERS: INHALATION_SPRAY | RESPIRATORY_TRACT | 1 refills | Status: DC

## 2020-11-25 MED ORDER — HYDROCORTISONE 2.5 % EX OINT: TOPICAL_OINTMENT | Freq: Two times a day (BID) | CUTANEOUS | 0 refills | Status: DC

## 2020-11-25 NOTE — Progress Notes (Signed)
    SUBJECTIVE:   CHIEF COMPLAINT / HPI:   Psoariasis: The patient has a longstanding history of psoriasis involving her scalp.  Patient used to be on a sort of shampoo, but does not remember what it was.  Her psoriasis is on her scalp and ears.  She believes the psoriasis is also affecting the inside of her ear.  She uses objects like Bobby pins to help remove extra wax, skin.    COPD: The patient has a history of COPD for which she is on albuterol.  Patient's last PFTs showed mild obstructive disease.  The patient reports that she sees a pulmonologist in [place] and used to be on [employer].  Otherwise, the patient is only on albuterol.  She continues to smoke half pack per day of cigarettes.  She's concerned about stuffiness in her nose and whether or not COPD is causing it.   Chest tightness after eating: Patient also reports that she has recently been experiencing a "globus" sensation with some chest tightness after eating.  Denies shortness of breath, chest pain, abdominal pain, left arm pain.  Patient has an extensive history of acid reflux for which she used to be on Prilosec.  Other topics we were unable to address today: Left hammertoe Occasional right foot swelling Headache/stuffiness in her nose Blurry eyes  PERTINENT  PMH / PSH: HLD, Eczema, GERD, Allergic rhinitis, Depression, Anxiety   OBJECTIVE:   BP 117/75   Pulse 76   Ht 5\' 2"  (1.575 m)   Wt 155 lb 9.6 oz (70.6 kg)   SpO2 97%   BMI 28.46 kg/m    Physical exam: General: Well-appearing patient, nontoxic HEENT: Significant dryness/flaking appreciated to patient's scalp and occipital region without acute bleeding/discharge/evidence of infection; EOMI, PERRLA, patient's ears with significant dryness bilaterally, normal external auditory canals and tympanic membranes bilaterally; patent nares bilaterally with mild edematous turbinates; no pharyngeal erythema or tonsillar exudates Respiratory: CTA bilaterally,  comfortable work of breathing, moving air well, no wheezes appreciated Cardio: RRR, S1-S2 present, no murmurs appreciated  ASSESSMENT/PLAN:   Psoriasis -Wash hair daily with Selenium sulfide shampoo (can also use head and shoulders or Selsun Blue over-the-counter) until improvement starts to be seen, then wash hair twice weekly.  -For psoriasis of ears: eardrop (VoSol) to be applied to each inner ear 3 drops twice daily. Keep ear covered with Hytone steroid twice daily.   GERD -Pepcid 20 mg once daily to reduce symptoms of acid reflux. -If no improvement with symptoms, follow-up with PCP  Chronic obstructive pulmonary disease ([place]) Lungs clear to auscultation bilaterally today, comfortable work of breathing.  She is asking for refill of inhalers, believes she is on Incruse Ellipta.  Patient is also followed by [facility] pulmonologist. -Refilled chronic albuterol inhaler today -Strongly recommended patient follow-up with [facility] pulmonologist     Daisy Floro, [place]

## 2020-11-25 NOTE — Patient Instructions (Signed)
Thank you for coming in to see [place] today! Please see below to review our plan for today's visit:  1. Wash your hair daily with Selenium sulfide shampoo (you can also use head and shoulders or Selsun Blue over-the-counter) until you start to see improvement in your scalp.  After that you can wash your hair with the shampoo twice weekly (for example on Sunday then again on Wednesday).  You most likely have to continue using a shampoo like this long-term to keep your psoriasis less symptomatic. 2.  For your ears I prescribed 2 medications: One of them is an eardrop (VoSol) which needs to be applied to each inner ear 3 drops twice daily.  I also recommend keeping your ear covered with Hytone steroid (this is the same hydrocortisone 2.5%, we have to use a low dose steroid on your ears to prevent your skin from thinning).  I recommend using this twice daily. 3.  I have refilled your pravastatin and albuterol.  Please reach out to your pulmonologist regarding Incruse. 4.  Take Pepcid 20 mg once daily to reduce your symptoms of acid reflux.  Should your symptoms return or worsen I recommend that you follow-up with your PCP, Dr. Larae Grooms.  Please call the clinic at [phone] if your symptoms worsen or you have any concerns. It was our pleasure to serve you!   Dr. Milus Banister [facility]

## 2020-11-29 NOTE — Assessment & Plan Note (Signed)
-  Pepcid 20 mg once daily to reduce symptoms of acid reflux. -If no improvement with symptoms, follow-up with PCP

## 2020-11-29 NOTE — Assessment & Plan Note (Signed)
-  Wash hair daily with Selenium sulfide shampoo (can also use head and shoulders or Selsun Blue over-the-counter) until improvement starts to be seen, then wash hair twice weekly.  -For psoriasis of ears: eardrop (VoSol) to be applied to each inner ear 3 drops twice daily. Keep ear covered with Hytone steroid twice daily.

## 2020-11-29 NOTE — Assessment & Plan Note (Signed)
Lungs clear to auscultation bilaterally today, comfortable work of breathing.  She is asking for refill of inhalers, believes she is on Incruse Ellipta.  Patient is also followed by [facility] pulmonologist. -Refilled chronic albuterol inhaler today -Strongly recommended patient follow-up with [facility] pulmonologist

## 2021-01-17 ENCOUNTER — Ambulatory Visit
Admission: EM | Admit: 2021-01-17 | Discharge: 2021-01-17 | Disposition: A | Discharge status: Home / Self Care | Payer: BLUE CROSS/BLUE SHIELD | Attending: Physician Assistant | Admitting: Physician Assistant

## 2021-01-17 ENCOUNTER — Other Ambulatory Visit: Payer: Self-pay

## 2021-01-17 DIAGNOSIS — R0981 Nasal congestion: Secondary | ICD-10-CM | POA: Diagnosis not present

## 2021-01-17 DIAGNOSIS — J449 Chronic obstructive pulmonary disease, unspecified: Secondary | ICD-10-CM

## 2021-01-17 DIAGNOSIS — R058 Other specified cough: Secondary | ICD-10-CM | POA: Diagnosis not present

## 2021-01-17 DIAGNOSIS — J019 Acute sinusitis, unspecified: Secondary | ICD-10-CM | POA: Diagnosis not present

## 2021-01-17 MED ORDER — PROMETHAZINE-DM 6.25-15 MG/5ML PO SYRP
5.0000 mL | ORAL_SOLUTION | Freq: Four times a day (QID) | ORAL | 0 refills | Status: DC | PRN
Start: 2021-01-17 — End: 2021-07-12

## 2021-01-17 MED ORDER — FLUTICASONE PROPIONATE 50 MCG/ACT NA SUSP
1.0000 | Freq: Every day | NASAL | 0 refills | Status: DC
Start: 2021-01-17 — End: 2022-04-13

## 2021-01-17 MED ORDER — AMOXICILLIN-POT CLAVULANATE 875-125 MG PO TABS: 1.0000 | ORAL_TABLET | Freq: Two times a day (BID) | ORAL | 0 refills | Status: AC

## 2021-01-17 NOTE — ED Provider Notes (Signed)
[facility]    CSN: [id number] Arrival date & time: 01/17/21  1326      History   Chief Complaint No chief complaint on file.   HPI BOBBETTE EAKES is a 55 y.o. female presenting for approximately 2-week history of nasal congestion, sinus pain, fatigue, headaches, and cough productive of yellow-green sputum.  Has not had a fever she has not had a fever.  She denies any pain in her chest or breathing difficulty.  No vomiting or diarrhea.  No known exposure to influenza or COVID-19.  She does have past medical history significant for COPD.  Denies frequent exacerbations.  State COPD generally well controlled with her inhalers.  Patient smokes half a pack a day currently.  She has been smoking for the past greater than 40 years.  She has taken over-the-counter Allegra and tried Mucinex for symptoms without relief.  Patient says that symptoms seem to be getting worse.  She has no other concerns.  HPI  Past Medical History:  Diagnosis Date  . Anxiety   . COPD (chronic obstructive pulmonary disease) ([place])   . Depression   . Eczema   . GERD (gastroesophageal reflux disease)   . Hypothyroid     Patient Active Problem List   Diagnosis Date Noted  . Psoriasis 11/25/2020  . Dark stools 04/26/2020  . Hyperlipemia 03/25/2020  . Chronic obstructive pulmonary disease ([place]) 03/25/2020  . Encounter for screening for lung cancer 07/07/2019  . Hypernatremia 06/17/2017  . Fatigue 05/31/2017  . Psoriasis of scalp 05/31/2017  . Special screening for malignant neoplasms, colon   . Benign neoplasm of ascending colon   . Benign neoplasm of transverse colon   . Benign neoplasm of sigmoid colon   . Allergic rhinitis 12/25/2013  . Overweight 04/20/2011  . Eczema 04/20/2011  . VAGINITIS, ATROPHIC, POSTMENOPAUSAL 08/16/2010  . OSTEOARTHRITIS, SACROILIAC JOINT 10/20/2007  . MIGRAINE NOS W/O INTRACTABLE MIGRAINE 05/12/2007  . Anxiety state 01/13/2007  . GERD 01/13/2007  .  Hypothyroidism 12/05/2006  . Major depressive disorder, recurrent episode ([place]) 12/05/2006  . Tobacco abuse 12/05/2006    Past Surgical History:  Procedure Laterality Date  . CHOLECYSTECTOMY    . COLONOSCOPY WITH PROPOFOL N/A 09/27/2015   Procedure: COLONOSCOPY WITH PROPOFOL;  Surgeon: [name], MD;  Location: [facility] ENDOSCOPY;  Service: Endoscopy;  Laterality: N/A;  . TUBAL LIGATION      OB History   No obstetric history on file.      Home Medications    Prior to Admission medications   Medication Sig Start Date End Date Taking? Authorizing Provider  amoxicillin-clavulanate (AUGMENTIN) 875-125 MG tablet Take 1 tablet by mouth every 12 (twelve) hours for 5 days. 01/17/21 01/22/21 Yes Laurene Footman B, PA-C  fluticasone (FLONASE) 50 MCG/ACT nasal spray Place 1 spray into both nostrils daily. 01/17/21 02/16/21 Yes Danton Clap, PA-C  promethazine-dextromethorphan (PROMETHAZINE-DM) 6.25-15 MG/5ML syrup Take 5 mLs by mouth 4 (four) times daily as needed for cough. 01/17/21  Yes Laurene Footman B, PA-C  acetic acid-hydrocortisone (VOSOL-HC) OTIC solution Place 3 drops into both ears 2 (two) times daily. 11/25/20   Daisy Floro, DO  albuterol (VENTOLIN HFA) 108 (90 Base) MCG/ACT inhaler INHALE 2 PUFFS INTO THE LUNGS EVERY 4 (FOUR) HOURS AS NEEDED FOR WHEEZING OR SHORTNESS OF BREATH. 11/25/20   Daisy Floro, DO  buPROPion (WELLBUTRIN) 75 MG tablet TAKE 1 TABLET BY MOUTH TWICE A DAY 11/22/20   [name], [name], DO  Calcium Carbonate-Vitamin D3 (CALCIUM 600/VITAMIN  D) 600-400 MG-UNIT TABS Take 2 tablets by mouth daily with meal. 06/05/17   [name], MD  coal tar-salicylic acid 2 % shampoo Apply topically daily as needed for itching. 03/25/20   Sherene Sires, DO  escitalopram (LEXAPRO) 10 MG tablet Take 1 tablet (10 mg total) by mouth daily. 07/06/19   [name], MD  famotidine (PEPCID) 20 MG tablet Take 1 tablet (20 mg total) by mouth daily. 11/25/20   Daisy Floro, DO   fexofenadine (ALLEGRA) 180 MG tablet Take 1 tablet (180 mg total) by mouth daily. 02/04/15   Mariel Aloe, MD  hydrocortisone 2.5 % ointment Apply topically 2 (two) times daily. 11/25/20   Daisy Floro, DO  levothyroxine (SYNTHROID) 100 MCG tablet TAKE 1 TABLET BY MOUTH EVERY DAY BEFORE BREAKFAST 05/09/20   [name], [name], DO  nicotine (NICODERM CQ - DOSED IN MG/24 HOURS) 14 mg/24hr patch Place 1 patch (14 mg total) onto the skin daily. 07/06/19   [name], MD  nicotine polacrilex (NICORETTE) 2 MG gum Take 1 each (2 mg total) by mouth as needed for smoking cessation. 07/06/19   [name], MD  Olopatadine HCl (PATADAY) 0.2 % SOLN Apply 1 drop to eye daily. 05/31/17   [name], MD  omeprazole (PRILOSEC) 20 MG capsule TAKE 1 CAPSULE BY MOUTH EVERY DAY 07/06/19   [name], MD  pravastatin (PRAVACHOL) 40 MG tablet Take 1 tablet (40 mg total) by mouth daily. Please schedule office visit before next refill. 11/25/20   Daisy Floro, DO  selenium sulfide (SELSUN) 2.5 % shampoo Apply 1 application topically daily as needed for irritation. 11/25/20   Daisy Floro, DO  vitamin B-12 (CYANOCOBALAMIN) 1000 MCG tablet Take 1,000 mcg by mouth daily.    [provider]    Family History Family History  Problem Relation Age of Onset  . Stomach cancer Other   . Colitis Other   . Diabetes Other     Social History Social History   Tobacco Use  . Smoking status: Current Every Day Smoker    Packs/day: 0.50    Types: Cigarettes    Start date: 10/08/1977  . Smokeless tobacco: Never Used  . Tobacco comment: Previous 2 ppd smoker.    Vaping Use  . Vaping Use: Never used  Substance Use Topics  . Alcohol use: No    Alcohol/week: 0.0 standard drinks  . Drug use: No     Allergies   Patient has no known allergies.   Review of Systems Review of Systems  Constitutional: Negative for chills, diaphoresis, fatigue and fever.  HENT: Positive for congestion,  rhinorrhea and sinus pain. Negative for ear pain, sinus pressure and sore throat.   Respiratory: Positive for cough. Negative for shortness of breath and wheezing.   Cardiovascular: Negative for chest pain.  Gastrointestinal: Positive for nausea. Negative for abdominal pain and vomiting.  Musculoskeletal: Negative for arthralgias and myalgias.  Skin: Negative for rash.  Neurological: Positive for headaches. Negative for weakness.  Hematological: Negative for adenopathy.     Physical Exam Triage Vital Signs ED Triage Vitals  Enc Vitals Group     BP 01/17/21 1345 109/73     Pulse Rate 01/17/21 1345 74     Resp 01/17/21 1345 16     Temp 01/17/21 1345 99.3 F (37.4 C)     Temp Source 01/17/21 1345 Oral     SpO2 01/17/21 1345 98 %  Weight 01/17/21 1345 150 lb (68 kg)     Height --      Head Circumference --      Peak Flow --      Pain Score 01/17/21 1342 0     Pain Loc --      Pain Edu? --      Excl. in [place]? --    No data found.  Updated Vital Signs BP 109/73 (BP Location: Left Arm)   Pulse 74   Temp 99.3 F (37.4 C) (Oral)   Resp 16   Wt 150 lb (68 kg)   SpO2 98%   BMI 27.44 kg/m       Physical Exam Vitals and nursing note reviewed.  Constitutional:      General: She is not in acute distress.    Appearance: Normal appearance. She is not ill-appearing or toxic-appearing.  HENT:     Head: Normocephalic and atraumatic.     Right Ear: Tympanic membrane, ear canal and external ear normal.     Left Ear: Tympanic membrane, ear canal and external ear normal.     Nose: Congestion and rhinorrhea present.     Right Sinus: Maxillary sinus tenderness present.     Left Sinus: Maxillary sinus tenderness present.     Mouth/Throat:     Mouth: Mucous membranes are moist.     Pharynx: Oropharynx is clear. Posterior oropharyngeal erythema present.  Eyes:     General: No scleral icterus.       Right eye: No discharge.        Left eye: No discharge.     Conjunctiva/sclera:  Conjunctivae normal.  Cardiovascular:     Rate and Rhythm: Normal rate and regular rhythm.     Heart sounds: Normal heart sounds.  Pulmonary:     Effort: Pulmonary effort is normal. No respiratory distress.     Breath sounds: Normal breath sounds. No wheezing, rhonchi or rales.  Musculoskeletal:     Cervical back: Neck supple.  Skin:    General: Skin is dry.  Neurological:     General: No focal deficit present.     Mental Status: She is alert. Mental status is at baseline.     Motor: No weakness.     Gait: Gait normal.  Psychiatric:        Mood and Affect: Mood normal.        Behavior: Behavior normal.        Thought Content: Thought content normal.      UC Treatments / Results  Labs (all labs ordered are listed, but only abnormal results are displayed) Labs Reviewed - No data to display  EKG   Radiology No results found.  Procedures Procedures (including critical care time)  Medications Ordered in UC Medications - No data to display  Initial Impression / Assessment and Plan / UC Course  I have reviewed the triage vital signs and the nursing notes.  Pertinent labs & imaging results that were available during my care of the patient were reviewed by me and considered in my medical decision making (see chart for details).   55 year old female with history of COPD presenting for 2-week history of nasal congestion, sinus pain and productive cough.  Denies any breathing difficulty.  All vital signs are normal and stable.  98% oxygen saturation.  He does have maxillary sinus tenderness and nasal congestion/rhinorrhea.  Her chest is clear to auscultation now and heart regular rate and rhythm.  Treating patient at this  time for acute sinusitis with Augmentin.  Also sent Promethazine DM and Flonase.  Encouraged her to increase rest and fluids and continue with her at home COPD inhalers.  Advised her to follow-up with [place] or emergency department for any worsening cough or increased  breathing difficulty.  Patient agreeable.  Work note given.   Final Clinical Impressions(s) / UC Diagnoses   Final diagnoses:  Acute sinusitis, recurrence not specified, unspecified location  Nasal congestion  Productive cough  Chronic obstructive pulmonary disease, unspecified COPD type ([place])     Discharge Instructions     Since symptoms have been ongoing x2 weeks, I have sent Augmentin.  Take full course of medication and increase rest and fluids.  Have also sent Promethazine DM and Flonase nasal spray.  If you develop a fever, worsening cough or breathing problem please return or go to the emergency department.    ED Prescriptions    Medication Sig Dispense Auth. Provider   amoxicillin-clavulanate (AUGMENTIN) 875-125 MG tablet Take 1 tablet by mouth every 12 (twelve) hours for 5 days. 10 tablet Danton Clap, PA-C   promethazine-dextromethorphan (PROMETHAZINE-DM) 6.25-15 MG/5ML syrup Take 5 mLs by mouth 4 (four) times daily as needed for cough. 118 mL Laurene Footman B, PA-C   fluticasone (FLONASE) 50 MCG/ACT nasal spray Place 1 spray into both nostrils daily. 1 g Danton Clap, PA-C     PDMP not reviewed this encounter.   Danton Clap, PA-C 01/17/21 1422

## 2021-01-17 NOTE — Discharge Instructions (Signed)
Since symptoms have been ongoing x2 weeks, I have sent Augmentin.  Take full course of medication and increase rest and fluids.  Have also sent Promethazine DM and Flonase nasal spray.  If you develop a fever, worsening cough or breathing problem please return or go to the emergency department.

## 2021-01-17 NOTE — ED Triage Notes (Signed)
Patient presents to Urgent Care with complaints of productive cough, nausea, fatigue, stuffy nose x 10 days. Treating symptoms with allegra, Nyquil, dayquil. Pt states she has a hx of allergies.   Denies fever.

## 2021-03-09 ENCOUNTER — Other Ambulatory Visit: Payer: Self-pay

## 2021-03-09 ENCOUNTER — Encounter: Payer: Self-pay | Admitting: Student in an Organized Health Care Education/Training Program

## 2021-03-09 ENCOUNTER — Ambulatory Visit
Admission: RE | Admit: 2021-03-09 | Discharge: 2021-03-09 | Disposition: A | Discharge status: Home / Self Care | Payer: BLUE CROSS/BLUE SHIELD | Source: Ambulatory Visit | Attending: Family Medicine | Admitting: Family Medicine

## 2021-03-09 ENCOUNTER — Telehealth: Payer: Self-pay

## 2021-03-09 ENCOUNTER — Ambulatory Visit (INDEPENDENT_AMBULATORY_CARE_PROVIDER_SITE_OTHER): Payer: BLUE CROSS/BLUE SHIELD | Admitting: Student in an Organized Health Care Education/Training Program

## 2021-03-09 VITALS — BP 102/64 | HR 82 | Wt 146.4 lb

## 2021-03-09 DIAGNOSIS — Z79899 Other long term (current) drug therapy: Secondary | ICD-10-CM | POA: Diagnosis not present

## 2021-03-09 DIAGNOSIS — M5410 Radiculopathy, site unspecified: Secondary | ICD-10-CM

## 2021-03-09 DIAGNOSIS — E039 Hypothyroidism, unspecified: Secondary | ICD-10-CM

## 2021-03-09 DIAGNOSIS — M79602 Pain in left arm: Secondary | ICD-10-CM

## 2021-03-09 DIAGNOSIS — E785 Hyperlipidemia, unspecified: Secondary | ICD-10-CM

## 2021-03-09 NOTE — Patient Instructions (Addendum)
It was a pleasure to see you today!  To summarize our discussion for this visit:  It would appear that your neck and limb pains are associated with spine issues. I recommend we get an xray today to looks for signs of joint space narrowing that could be impinging your nerves.   I recommend you try at home rehab exercises for your spine and experiment with different sleeping positions/pillows to try to reduce your symptoms.   We are getting some labs today to monitor things like cholesterol, kidneys, thyroid, etc.   Some additional health maintenance measures we should update are: Health Maintenance Due  Topic Date Due  . COVID-19 Vaccine (1) Never done  . Zoster Vaccines- Shingrix (1 of 2) Never done  . PAP SMEAR-Modifier  12/25/2016  . COLONOSCOPY (Pts 45-36yrs Insurance coverage will need to be confirmed)  09/26/2020  .    Please return to our clinic to see me as needed.  Call the clinic at [phone] if your symptoms worsen or you have any concerns.   Thank you for allowing me to take part in your care,  Dr. Doristine Mango   Cervical Strain and Sprain Rehab Ask your health care provider which exercises are safe for you. Do exercises exactly as told by your health care provider and adjust them as directed. It is normal to feel mild stretching, pulling, tightness, or discomfort as you do these exercises. Stop right away if you feel sudden pain or your pain gets worse. Do not begin these exercises until told by your health care provider. Stretching and range-of-motion exercises Cervical side bending 1. Using good posture, sit on a stable chair or stand up. 2. Without moving your shoulders, slowly tilt your left / right ear to your shoulder until you feel a stretch in the opposite side neck muscles. You should be looking straight ahead. 3. Hold for __________ seconds. 4. Repeat with the other side of your neck. Repeat __________ times. Complete this exercise __________ times a  day.   Cervical rotation 1. Using good posture, sit on a stable chair or stand up. 2. Slowly turn your head to the side as if you are looking over your left / right shoulder. ? Keep your eyes level with the ground. ? Stop when you feel a stretch along the side and the back of your neck. 3. Hold for __________ seconds. 4. Repeat this by turning to your other side. Repeat __________ times. Complete this exercise __________ times a day.   Thoracic extension and pectoral stretch 1. Roll a towel or a small blanket so it is about 4 inches (10 cm) in diameter. 2. Lie down on your back on a firm surface. 3. Put the towel lengthwise, under your spine in the middle of your back. It should not be under your shoulder blades. The towel should line up with your spine from your middle back to your lower back. 4. Put your hands behind your head and let your elbows fall out to your sides. 5. Hold for __________ seconds. Repeat __________ times. Complete this exercise __________ times a day. Strengthening exercises Isometric upper cervical flexion 1. Lie on your back with a thin pillow behind your head and a small rolled-up towel under your neck. 2. Gently tuck your chin toward your chest and nod your head down to look toward your feet. Do not lift your head off the pillow. 3. Hold for __________ seconds. 4. Release the tension slowly. Relax your neck muscles completely before  you repeat this exercise. Repeat __________ times. Complete this exercise __________ times a day. Isometric cervical extension 1. Stand about 6 inches (15 cm) away from a wall, with your back facing the wall. 2. Place a soft object, about 6-8 inches (15-20 cm) in diameter, between the back of your head and the wall. A soft object could be a small pillow, a ball, or a folded towel. 3. Gently tilt your head back and press into the soft object. Keep your jaw and forehead relaxed. 4. Hold for __________ seconds. 5. Release the tension  slowly. Relax your neck muscles completely before you repeat this exercise. Repeat __________ times. Complete this exercise __________ times a day.   Posture and body mechanics Body mechanics refers to the movements and positions of your body while you do your daily activities. Posture is part of body mechanics. Good posture and healthy body mechanics can help to relieve stress in your body's tissues and joints. Good posture means that your spine is in its natural S-curve position (your spine is neutral), your shoulders are pulled back slightly, and your head is not tipped forward. The following are general guidelines for applying improved posture and body mechanics to your everyday activities. Sitting 1. When sitting, keep your spine neutral and keep your feet flat on the floor. Use a footrest, if necessary, and keep your thighs parallel to the floor. Avoid rounding your shoulders, and avoid tilting your head forward. 2. When working at a desk or a computer, keep your desk at a height where your hands are slightly lower than your elbows. Slide your chair under your desk so you are close enough to maintain good posture. 3. When working at a computer, place your monitor at a height where you are looking straight ahead and you do not have to tilt your head forward or downward to look at the screen.   Standing  When standing, keep your spine neutral and keep your feet about hip-width apart. Keep a slight bend in your knees. Your ears, shoulders, and hips should line up.  When you do a task in which you stand in one place for a long time, place one foot up on a stable object that is 2-4 inches (5-10 cm) high, such as a footstool. This helps keep your spine neutral.   Resting When lying down and resting, avoid positions that are most painful for you. Try to support your neck in a neutral position. You can use a contour pillow or a small rolled-up towel. Your pillow should support your neck but not push on  it. This information is not intended to replace advice given to you by your health care provider. Make sure you discuss any questions you have with your health care provider. Document Revised: 01/14/2019 Document Reviewed: 06/25/2018 Elsevier Patient Education  2021 [employer].

## 2021-03-09 NOTE — Telephone Encounter (Signed)
Patient calls nurse line requesting to schedule appointment for left arm pain and neck pain. Patient reports that she has been having aching and numbness in left arm for approx 3 weeks when sleeping and sharp pain in neck.   Patient is requesting to schedule appointment as she has family history of cardiac issues and wants to make sure she does not have any blockages. Patient denies chest pain, shortness of breath, difficulty breathing.   Patient is requesting appointment this week as she starts new job next week.   Scheduled with Dr. Ouida Sills this afternoon for further evaluation of concern.   ED precautions given.   Talbot Grumbling, RN
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
## 2021-03-10 LAB — COMPREHENSIVE METABOLIC PANEL
ALT: 10 IU/L (ref 0–32)
AST: 17 IU/L (ref 0–40)
Albumin/Globulin Ratio: 1.6 (ref 1.2–2.2)
Albumin: 4.4 g/dL (ref 3.8–4.9)
Alkaline Phosphatase: 84 IU/L (ref 44–121)
BUN/Creatinine Ratio: 19 (ref 9–23)
BUN: 20 mg/dL (ref 6–24)
Bilirubin Total: <0.2 mg/dL (ref 0.0–1.2)
CO2: 20 mmol/L (ref 20–29)
Calcium: 9.6 mg/dL (ref 8.7–10.2)
Chloride: 108 mmol/L — ABNORMAL HIGH (ref 96–106)
Creatinine, Ser: 1.08 mg/dL — ABNORMAL HIGH (ref 0.57–1.00)
Globulin, Total: 2.7 g/dL (ref 1.5–4.5)
Glucose: 106 mg/dL — ABNORMAL HIGH (ref 65–99)
Potassium: 3.9 mmol/L (ref 3.5–5.2)
Sodium: 145 mmol/L — ABNORMAL HIGH (ref 134–144)
Total Protein: 7.1 g/dL (ref 6.0–8.5)
eGFR: 60 mL/min/{1.73_m2} (ref >59)

## 2021-03-10 LAB — HEMOGLOBIN A1C
Est. average glucose Bld gHb Est-mCnc: 108 mg/dL
Hgb A1c MFr Bld: 5.4 % (ref 4.8–5.6)

## 2021-03-10 LAB — CBC
Hematocrit: 39.5 % (ref 34.0–46.6)
Hemoglobin: 13.7 g/dL (ref 11.1–15.9)
MCH: 33.4 pg — ABNORMAL HIGH (ref 26.6–33.0)
MCHC: 34.7 g/dL (ref 31.5–35.7)
MCV: 96 fL (ref 79–97)
Platelets: 211 10*3/uL (ref 150–450)
RBC: 4.1 x10E6/uL (ref 3.77–5.28)
RDW: 12.1 % (ref 11.7–15.4)
WBC: 7 10*3/uL (ref 3.4–10.8)

## 2021-03-10 LAB — LIPID PANEL
Chol/HDL Ratio: 4.7 ratio — ABNORMAL HIGH (ref 0.0–4.4)
Cholesterol, Total: 154 mg/dL (ref 100–199)
HDL: 33 mg/dL — ABNORMAL LOW (ref >39)
LDL Chol Calc (NIH): 89 mg/dL (ref 0–99)
Triglycerides: 184 mg/dL — ABNORMAL HIGH (ref 0–149)
VLDL Cholesterol Cal: 32 mg/dL (ref 5–40)

## 2021-03-10 LAB — SPECIMEN STATUS REPORT

## 2021-03-10 LAB — TSH: TSH: 1 u[IU]/mL (ref 0.450–4.500)

## 2021-03-13 DIAGNOSIS — M79602 Pain in left arm: Secondary | ICD-10-CM | POA: Insufficient documentation

## 2021-03-13 NOTE — Assessment & Plan Note (Addendum)
Lipid panel today as well as CMP

## 2021-03-13 NOTE — Assessment & Plan Note (Signed)
Based on history physical, suspect patient's arm pain is radicular in nature likely from compression of C5 or C6.  Recommended c-spine xray to look for signs of arthritis or other compressive process.  - discussed alternating sleeping position and using pillows to avoid reoccurrence. Also recommended at home rehab exercises. Patient to follow up if still experiencing symptoms for referral for formal PT vs possible corrective surgery

## 2021-03-13 NOTE — Assessment & Plan Note (Addendum)
Repeat TSH today. Patient adherent with treatment and denies signs of hypo/hyper

## 2021-04-14 ENCOUNTER — Ambulatory Visit: Payer: BLUE CROSS/BLUE SHIELD | Admitting: Cardiology

## 2021-06-25 ENCOUNTER — Other Ambulatory Visit: Payer: Self-pay | Admitting: Family Medicine

## 2021-06-25 DIAGNOSIS — E039 Hypothyroidism, unspecified: Secondary | ICD-10-CM

## 2021-07-12 ENCOUNTER — Other Ambulatory Visit: Payer: Self-pay

## 2021-07-12 ENCOUNTER — Ambulatory Visit
Admission: EM | Admit: 2021-07-12 | Discharge: 2021-07-12 | Disposition: A | Discharge status: Home / Self Care | Payer: BLUE CROSS/BLUE SHIELD | Attending: Family Medicine | Admitting: Family Medicine

## 2021-07-12 DIAGNOSIS — B349 Viral infection, unspecified: Secondary | ICD-10-CM | POA: Diagnosis not present

## 2021-07-12 MED ORDER — IPRATROPIUM BROMIDE 0.06 % NA SOLN
2.0000 | Freq: Four times a day (QID) | NASAL | 0 refills | Status: DC | PRN
Start: 2021-07-12 — End: 2022-04-13

## 2021-07-12 MED ORDER — BENZONATATE 200 MG PO CAPS: 200.0000 mg | ORAL_CAPSULE | Freq: Three times a day (TID) | ORAL | 0 refills | Status: DC | PRN

## 2021-07-12 MED ORDER — ONDANSETRON 4 MG PO TBDP: 4.0000 mg | ORAL_TABLET | Freq: Three times a day (TID) | ORAL | 0 refills | Status: DC | PRN

## 2021-07-12 NOTE — Discharge Instructions (Addendum)
This is a viral illness (especially since both of you have the same symptoms).  Stay home.  Rest and fluids.  Medication as prescribed.

## 2021-07-12 NOTE — ED Triage Notes (Signed)
Pt c/o nausea, fatigue and headache since this past weekend. Pt also has a cough. Pt denies f/v/d or other symptoms.

## 2021-07-12 NOTE — ED Provider Notes (Signed)
[facility]    CSN: [id number] Arrival date & time: 07/12/21  1107      History   Chief Complaint Chief Complaint  Patient presents with   Nausea   Headache   HPI 55 year old female presents with multiple complaints.  Patient states that her symptoms started over the weekend.  Her granddaughter has the same symptoms.  She reports headache, nausea, fatigue, cough, stuffy nose, runny nose, ear pain.  No fever.  No relieving factors.  She declines COVID testing.  No reported sick contacts other than her granddaughter.    Past Medical History:  Diagnosis Date   Anxiety    COPD (chronic obstructive pulmonary disease) ([place])    Depression    Eczema    GERD (gastroesophageal reflux disease)    Hypothyroid     Patient Active Problem List   Diagnosis Date Noted   Arm pain, left 03/13/2021   Psoriasis 11/25/2020   Dark stools 04/26/2020   Hyperlipemia 03/25/2020   Chronic obstructive pulmonary disease ([place]) 03/25/2020   Encounter for screening for lung cancer 07/07/2019   Hypernatremia 06/17/2017   Fatigue 05/31/2017   Psoriasis of scalp 05/31/2017   Special screening for malignant neoplasms, colon    Benign neoplasm of ascending colon    Benign neoplasm of transverse colon    Benign neoplasm of sigmoid colon    Allergic rhinitis 12/25/2013   Overweight 04/20/2011   Eczema 04/20/2011   VAGINITIS, ATROPHIC, POSTMENOPAUSAL 08/16/2010   OSTEOARTHRITIS, SACROILIAC JOINT 10/20/2007   MIGRAINE NOS W/O INTRACTABLE MIGRAINE 05/12/2007   Anxiety state 01/13/2007   GERD 01/13/2007   Hypothyroidism 12/05/2006   Major depressive disorder, recurrent episode ([place]) 12/05/2006   Tobacco abuse 12/05/2006    Past Surgical History:  Procedure Laterality Date   CHOLECYSTECTOMY     COLONOSCOPY WITH PROPOFOL N/A 09/27/2015   Procedure: COLONOSCOPY WITH PROPOFOL;  Surgeon: [name], MD;  Location: [facility] ENDOSCOPY;  Service: Endoscopy;  Laterality: N/A;   TUBAL LIGATION       OB History   No obstetric history on file.      Home Medications    Prior to Admission medications   Medication Sig Start Date End Date Taking? Authorizing Provider  acetic acid-hydrocortisone (VOSOL-HC) OTIC solution Place 3 drops into both ears 2 (two) times daily. 11/25/20  Yes [name], DO  albuterol (VENTOLIN HFA) 108 (90 Base) MCG/ACT inhaler INHALE 2 PUFFS INTO THE LUNGS EVERY 4 (FOUR) HOURS AS NEEDED FOR WHEEZING OR SHORTNESS OF BREATH. 11/25/20  Yes [name], DO  benzonatate (TESSALON) 200 MG capsule Take 1 capsule (200 mg total) by mouth 3 (three) times daily as needed for cough. 07/12/21  Yes Kenzo Ozment G, DO  buPROPion (WELLBUTRIN) 75 MG tablet TAKE 1 TABLET BY MOUTH TWICE A DAY 11/22/20  Yes [name], [name], DO  Calcium Carbonate-Vitamin D3 (CALCIUM 600/VITAMIN D) 600-400 MG-UNIT TABS Take 2 tablets by mouth daily with meal. 06/05/17  Yes Gonfa, Taye T, MD  coal tar-salicylic acid 2 % shampoo Apply topically daily as needed for itching. 03/25/20  Yes Bland, Scott, DO  escitalopram (LEXAPRO) 10 MG tablet Take 1 tablet (10 mg total) by mouth daily. 07/06/19  Yes [name], MD  famotidine (PEPCID) 20 MG tablet Take 1 tablet (20 mg total) by mouth daily. 11/25/20  Yes Daisy Floro, DO  fexofenadine (ALLEGRA) 180 MG tablet Take 1 tablet (180 mg total) by mouth daily. 02/04/15  Yes Mariel Aloe, MD  hydrocortisone 2.5 %  ointment Apply topically 2 (two) times daily. 11/25/20  Yes [name], DO  ipratropium (ATROVENT) 0.06 % nasal spray Place 2 sprays into both nostrils 4 (four) times daily as needed for rhinitis. 07/12/21  Yes Tina Temme G, DO  levothyroxine (SYNTHROID) 100 MCG tablet TAKE 1 TABLET BY MOUTH EVERY DAY BEFORE BREAKFAST 06/27/21  Yes [name], [name], DO  nicotine (NICODERM CQ - DOSED IN MG/24 HOURS) 14 mg/24hr patch Place 1 patch (14 mg total) onto the skin daily. 07/06/19  Yes [name], MD  nicotine polacrilex (NICORETTE) 2 MG gum  Take 1 each (2 mg total) by mouth as needed for smoking cessation. 07/06/19  Yes [name], MD  Olopatadine HCl (PATADAY) 0.2 % SOLN Apply 1 drop to eye daily. 05/31/17  Yes [name], MD  omeprazole (PRILOSEC) 20 MG capsule TAKE 1 CAPSULE BY MOUTH EVERY DAY 07/06/19  Yes [name], MD  ondansetron (ZOFRAN ODT) 4 MG disintegrating tablet Take 1 tablet (4 mg total) by mouth every 8 (eight) hours as needed for nausea or vomiting. 07/12/21  Yes Mahkayla Preece G, DO  pravastatin (PRAVACHOL) 40 MG tablet Take 1 tablet (40 mg total) by mouth daily. Please schedule office visit before next refill. 11/25/20  Yes Daisy Floro, DO  selenium sulfide (SELSUN) 2.5 % shampoo Apply 1 application topically daily as needed for irritation. 11/25/20  Yes Daisy Floro, DO  vitamin B-12 (CYANOCOBALAMIN) 1000 MCG tablet Take 1,000 mcg by mouth daily.   Yes [provider]  fluticasone (FLONASE) 50 MCG/ACT nasal spray Place 1 spray into both nostrils daily. 01/17/21 02/16/21  Danton Clap, PA-C    Family History Family History  Problem Relation Age of Onset   Stomach cancer Other    Colitis Other    Diabetes Other     Social History Social History   Tobacco Use   Smoking status: Every Day    Packs/day: 0.50    Types: Cigarettes    Start date: 10/08/1977   Smokeless tobacco: Never   Tobacco comments:    Previous 2 ppd smoker.    Vaping Use   Vaping Use: Never used  Substance Use Topics   Alcohol use: No    Alcohol/week: 0.0 standard drinks   Drug use: No     Allergies   Patient has no known allergies.   Review of Systems Review of Systems  Constitutional:  Positive for fatigue.  HENT:  Positive for congestion.   Respiratory:  Positive for cough.   Gastrointestinal:  Positive for nausea.  Neurological:  Positive for headaches.    Physical Exam Triage Vital Signs ED Triage Vitals  Enc Vitals Group     BP 07/12/21 1129 (!) 111/58     Pulse Rate 07/12/21 1129 74      Resp 07/12/21 1129 18     Temp 07/12/21 1129 97.9 F (36.6 C)     Temp Source 07/12/21 1129 Oral     SpO2 07/12/21 1129 100 %     Weight 07/12/21 1124 145 lb (65.8 kg)     Height 07/12/21 1124 5\' 3"  (1.6 m)     Head Circumference --      Peak Flow --      Pain Score 07/12/21 1124 0     Pain Loc --      Pain Edu? --      Excl. in [place]? --    Updated Vital Signs BP (!) 111/58 (BP Location:  Left Arm)   Pulse 74   Temp 97.9 F (36.6 C) (Oral)   Resp 18   Ht 5\' 3"  (1.6 m)   Wt 65.8 kg   SpO2 100%   BMI 25.69 kg/m   Visual Acuity Right Eye Distance:   Left Eye Distance:   Bilateral Distance:    Right Eye Near:   Left Eye Near:    Bilateral Near:     Physical Exam Vitals and nursing note reviewed.  Constitutional:      General: She is not in acute distress.    Appearance: Normal appearance. She is not ill-appearing.  HENT:     Head: Normocephalic and atraumatic.     Right Ear: Tympanic membrane normal.     Left Ear: Tympanic membrane normal.     Mouth/Throat:     Pharynx: Oropharynx is clear.  Eyes:     General:        Right eye: No discharge.        Left eye: No discharge.     Conjunctiva/sclera: Conjunctivae normal.  Cardiovascular:     Rate and Rhythm: Normal rate and regular rhythm.     Heart sounds: No murmur heard. Pulmonary:     Effort: Pulmonary effort is normal.     Breath sounds: Normal breath sounds. No wheezing, rhonchi or rales.  Neurological:     Mental Status: She is alert.     UC Treatments / Results  Labs (all labs ordered are listed, but only abnormal results are displayed) Labs Reviewed - No data to display  EKG   Radiology No results found.  Procedures Procedures (including critical care time)  Medications Ordered in UC Medications - No data to display  Initial Impression / Assessment and Plan / UC Course  I have reviewed the triage vital signs and the nursing notes.  Pertinent labs & imaging results that were available  during my care of the patient were reviewed by me and considered in my medical decision making (see chart for details).    55 year old female presents with a viral illness.  Patient is well-appearing on exam.  Vital signs normal.  There are no significant findings on her exam.  Her granddaughter has the exact same symptoms.  I discussed COVID testing and she declined.  Treating symptomatically with Zofran, Atrovent nasal spray, and Tessalon Perles.  Work note given.  Final Clinical Impressions(s) / UC Diagnoses   Final diagnoses:  Viral illness     Discharge Instructions      This is a viral illness (especially since both of you have the same symptoms).  Stay home.  Rest and fluids.  Medication as prescribed.    ED Prescriptions     Medication Sig Dispense Auth. Provider   ondansetron (ZOFRAN ODT) 4 MG disintegrating tablet Take 1 tablet (4 mg total) by mouth every 8 (eight) hours as needed for nausea or vomiting. 20 tablet Badr Piedra G, DO   ipratropium (ATROVENT) 0.06 % nasal spray Place 2 sprays into both nostrils 4 (four) times daily as needed for rhinitis. 15 mL Yeny Schmoll G, DO   benzonatate (TESSALON) 200 MG capsule Take 1 capsule (200 mg total) by mouth 3 (three) times daily as needed for cough. 30 capsule Coral Spikes, DO      PDMP not reviewed this encounter.   Coral Spikes, [place] 07/12/21 1158

## 2021-07-20 ENCOUNTER — Emergency Department: Payer: BLUE CROSS/BLUE SHIELD

## 2021-07-20 ENCOUNTER — Other Ambulatory Visit: Payer: Self-pay

## 2021-07-20 ENCOUNTER — Emergency Department
Admission: EM | Admit: 2021-07-20 | Discharge: 2021-07-20 | Disposition: A | Discharge status: Home / Self Care | Payer: BLUE CROSS/BLUE SHIELD | Attending: Emergency Medicine | Admitting: Emergency Medicine

## 2021-07-20 DIAGNOSIS — E039 Hypothyroidism, unspecified: Secondary | ICD-10-CM | POA: Insufficient documentation

## 2021-07-20 DIAGNOSIS — J449 Chronic obstructive pulmonary disease, unspecified: Secondary | ICD-10-CM | POA: Diagnosis not present

## 2021-07-20 DIAGNOSIS — R072 Precordial pain: Secondary | ICD-10-CM | POA: Insufficient documentation

## 2021-07-20 DIAGNOSIS — Z79899 Other long term (current) drug therapy: Secondary | ICD-10-CM | POA: Insufficient documentation

## 2021-07-20 DIAGNOSIS — Z20822 Contact with and (suspected) exposure to covid-19: Secondary | ICD-10-CM | POA: Diagnosis not present

## 2021-07-20 DIAGNOSIS — R519 Headache, unspecified: Secondary | ICD-10-CM

## 2021-07-20 DIAGNOSIS — F17210 Nicotine dependence, cigarettes, uncomplicated: Secondary | ICD-10-CM | POA: Diagnosis not present

## 2021-07-20 LAB — CBC WITH DIFFERENTIAL/PLATELET
Abs Immature Granulocytes: 0.04 10*3/uL (ref 0.00–0.07)
Basophils Absolute: 0.1 10*3/uL (ref 0.0–0.1)
Basophils Relative: 1 %
Eosinophils Absolute: 0.1 10*3/uL (ref 0.0–0.5)
Eosinophils Relative: 1 %
HCT: 41.8 % (ref 36.0–46.0)
Hemoglobin: 13.9 g/dL (ref 12.0–15.0)
Immature Granulocytes: 1 %
Lymphocytes Relative: 34 %
Lymphs Abs: 2.5 10*3/uL (ref 0.7–4.0)
MCH: 33.6 pg (ref 26.0–34.0)
MCHC: 33.3 g/dL (ref 30.0–36.0)
MCV: 101 fL — ABNORMAL HIGH (ref 80.0–100.0)
Monocytes Absolute: 0.4 10*3/uL (ref 0.1–1.0)
Monocytes Relative: 5 %
Neutro Abs: 4.3 10*3/uL (ref 1.7–7.7)
Neutrophils Relative %: 58 %
Platelets: 216 10*3/uL (ref 150–400)
RBC: 4.14 MIL/uL (ref 3.87–5.11)
RDW: 14.5 % (ref 11.5–15.5)
WBC: 7.3 10*3/uL (ref 4.0–10.5)
nRBC: 0 % (ref 0.0–0.2)

## 2021-07-20 LAB — RESP PANEL BY RT-PCR (FLU A&B, COVID) ARPGX2
Influenza A by PCR: NEGATIVE
Influenza B by PCR: NEGATIVE
SARS Coronavirus 2 by RT PCR: NEGATIVE

## 2021-07-20 LAB — COMPREHENSIVE METABOLIC PANEL
ALT: 11 U/L (ref 0–44)
AST: 18 U/L (ref 15–41)
Albumin: 4.1 g/dL (ref 3.5–5.0)
Alkaline Phosphatase: 64 U/L (ref 38–126)
Anion gap: 12 (ref 5–15)
BUN: 26 mg/dL — ABNORMAL HIGH (ref 6–20)
CO2: 20 mmol/L — ABNORMAL LOW (ref 22–32)
Calcium: 9.3 mg/dL (ref 8.9–10.3)
Chloride: 107 mmol/L (ref 98–111)
Creatinine, Ser: 1.09 mg/dL — ABNORMAL HIGH (ref 0.44–1.00)
GFR, Estimated: 60 mL/min — ABNORMAL LOW (ref >60)
Glucose, Bld: 72 mg/dL (ref 70–99)
Potassium: 4.2 mmol/L (ref 3.5–5.1)
Sodium: 139 mmol/L (ref 135–145)
Total Bilirubin: 0.4 mg/dL (ref 0.3–1.2)
Total Protein: 7.6 g/dL (ref 6.5–8.1)

## 2021-07-20 LAB — LIPASE, BLOOD: Lipase: 46 U/L (ref 11–51)

## 2021-07-20 LAB — URINALYSIS, ROUTINE W REFLEX MICROSCOPIC
Bilirubin Urine: NEGATIVE
Glucose, UA: NEGATIVE mg/dL
Hgb urine dipstick: NEGATIVE
Ketones, ur: NEGATIVE mg/dL
Leukocytes,Ua: NEGATIVE
Nitrite: NEGATIVE
Protein, ur: NEGATIVE mg/dL
Specific Gravity, Urine: 1.006 (ref 1.005–1.030)
pH: 5 (ref 5.0–8.0)

## 2021-07-20 LAB — TROPONIN I (HIGH SENSITIVITY): Troponin I (High Sensitivity): 2 ng/L (ref <18)

## 2021-07-20 MED ORDER — METOCLOPRAMIDE HCL 10 MG PO TABS: 10.0000 mg | ORAL_TABLET | Freq: Three times a day (TID) | ORAL | 0 refills | Status: DC

## 2021-07-20 NOTE — Discharge Instructions (Signed)
Please stop taking the BCs for her headache as I think this may be contributing to your symptoms.  You can start taking the Reglan as needed.  Please follow-up with your primary care provider if your symptoms continue.  I have also given you the name of a neurologist which she should call as you may need to have an MRI if your headaches do not improve.

## 2021-07-20 NOTE — ED Notes (Signed)
Patient discharged to home per MD order. Patient in stable condition, and deemed medically cleared by ED provider for discharge. Discharge instructions reviewed with patient/family using "Teach Back"; verbalized understanding of medication education and administration, and information about follow-up care. Denies further concerns. ° °

## 2021-07-20 NOTE — ED Triage Notes (Signed)
C/O headache and dizziness x 2 weeks.  Has been taking 6 BC's a day for pain.  AAOx3.  Skin warm and dry. NAD

## 2021-07-20 NOTE — ED Triage Notes (Signed)
Pt has had a HA for 2 weeks, no appetite, weak, and CP. Pt was seen last week and was told that it was a virus but pt still is not back to herself. Pt takes 6 BC powders a day. Pt is also dizzy. Pt in NAD in triage. Pt has hx of COPD.

## 2021-07-20 NOTE — ED Provider Notes (Signed)
Emergency Medicine Provider Triage Evaluation Note  Helen Jones , a 55 y.o. female  was evaluated in triage.  Pt complains of headache, decreased appetite, chest pain, dizziness, weakness.  Review of Systems  Positive: Headache, chest pain, dizziness, weakness Negative: Fever, chills, vomiting or diarrhea  Physical Exam  BP 110/69 (BP Location: Left Arm)   Pulse 72   Temp 98 F (36.7 C) (Oral)   Resp 18   Ht 5\' 2"  (1.575 m)   Wt 65.8 kg   SpO2 98%   BMI 26.52 kg/m  Gen:   Awake, no distress   Resp:  Normal effort  MSK:   Moves extremities without difficulty  Other:    Medical Decision Making  Medically screening exam initiated at 2:51 PM.  Appropriate orders placed.  Leim Fabry was informed that the remainder of the evaluation will be completed by another provider, this initial triage assessment does not replace that evaluation, and the importance of remaining in the ED until their evaluation is complete.     Versie Starks, PA-C 07/20/21 1452    Carrie Mew, MD 07/20/21 1945

## 2021-07-20 NOTE — ED Provider Notes (Signed)
[facility]  ____________________________________________   Event Date/Time   First MD Initiated Contact with Patient 07/20/21 1947     (approximate)  I have reviewed the triage vital signs and the nursing notes.   HISTORY  Chief Complaint Headache and Chest Pain    HPI Helen Jones is a 55 y.o. female with past medical history of COPD, anxiety, depression, migraine headache who presents with 2 weeks of headaches.  Patient tells me that over the last 2 weeks she has had a bifrontal headache.  It comes and goes.  It is sometimes worse in the morning, there is no associated nausea vomiting or visual changes.  She has no numbness or weakness.  Patient takes BCs for this and has been taking around 6/day.  Pain is usually relieved by the BCs.  Patient also complains of some intermittent substernal chest pressure which is nonexertional without associated dyspnea nausea or diaphoresis.  Patient also complaining of burning in her feet which is more of a chronic issue.         Past Medical History:  Diagnosis Date   Anxiety    COPD (chronic obstructive pulmonary disease) ([place])    Depression    Eczema    GERD (gastroesophageal reflux disease)    Hypothyroid     Patient Active Problem List   Diagnosis Date Noted   Arm pain, left 03/13/2021   Psoriasis 11/25/2020   Dark stools 04/26/2020   Hyperlipemia 03/25/2020   Chronic obstructive pulmonary disease ([place]) 03/25/2020   Encounter for screening for lung cancer 07/07/2019   Hypernatremia 06/17/2017   Fatigue 05/31/2017   Psoriasis of scalp 05/31/2017   Special screening for malignant neoplasms, colon    Benign neoplasm of ascending colon    Benign neoplasm of transverse colon    Benign neoplasm of sigmoid colon    Allergic rhinitis 12/25/2013   Overweight 04/20/2011   Eczema 04/20/2011   VAGINITIS, ATROPHIC, POSTMENOPAUSAL 08/16/2010   OSTEOARTHRITIS, SACROILIAC JOINT 10/20/2007   MIGRAINE NOS  W/O INTRACTABLE MIGRAINE 05/12/2007   Anxiety state 01/13/2007   GERD 01/13/2007   Hypothyroidism 12/05/2006   Major depressive disorder, recurrent episode ([place]) 12/05/2006   Tobacco abuse 12/05/2006    Past Surgical History:  Procedure Laterality Date   CHOLECYSTECTOMY     COLONOSCOPY WITH PROPOFOL N/A 09/27/2015   Procedure: COLONOSCOPY WITH PROPOFOL;  Surgeon: [name], MD;  Location: [facility] ENDOSCOPY;  Service: Endoscopy;  Laterality: N/A;   TUBAL LIGATION      Prior to Admission medications   Medication Sig Start Date End Date Taking? Authorizing Provider  metoCLOPramide (REGLAN) 10 MG tablet Take 1 tablet (10 mg total) by mouth 3 (three) times daily with meals. 07/20/21 08/19/21 Yes Rada Hay, MD  acetic acid-hydrocortisone (VOSOL-HC) OTIC solution Place 3 drops into both ears 2 (two) times daily. 11/25/20   Daisy Floro, DO  albuterol (VENTOLIN HFA) 108 (90 Base) MCG/ACT inhaler INHALE 2 PUFFS INTO THE LUNGS EVERY 4 (FOUR) HOURS AS NEEDED FOR WHEEZING OR SHORTNESS OF BREATH. 11/25/20   Daisy Floro, DO  benzonatate (TESSALON) 200 MG capsule Take 1 capsule (200 mg total) by mouth 3 (three) times daily as needed for cough. 07/12/21   Coral Spikes, DO  buPROPion (WELLBUTRIN) 75 MG tablet TAKE 1 TABLET BY MOUTH TWICE A DAY 11/22/20   [name], [name], DO  Calcium Carbonate-Vitamin D3 (CALCIUM 600/VITAMIN D) 600-400 MG-UNIT TABS Take 2 tablets by mouth daily with meal. 06/05/17  [name], MD  coal tar-salicylic acid 2 % shampoo Apply topically daily as needed for itching. 03/25/20   Sherene Sires, DO  escitalopram (LEXAPRO) 10 MG tablet Take 1 tablet (10 mg total) by mouth daily. 07/06/19   [name], MD  famotidine (PEPCID) 20 MG tablet Take 1 tablet (20 mg total) by mouth daily. 11/25/20   Daisy Floro, DO  fexofenadine (ALLEGRA) 180 MG tablet Take 1 tablet (180 mg total) by mouth daily. 02/04/15   Mariel Aloe, MD  fluticasone (FLONASE) 50 MCG/ACT nasal  spray Place 1 spray into both nostrils daily. 01/17/21 02/16/21  Laurene Footman B, PA-C  hydrocortisone 2.5 % ointment Apply topically 2 (two) times daily. 11/25/20   [name], DO  ipratropium (ATROVENT) 0.06 % nasal spray Place 2 sprays into both nostrils 4 (four) times daily as needed for rhinitis. 07/12/21   Coral Spikes, DO  levothyroxine (SYNTHROID) 100 MCG tablet TAKE 1 TABLET BY MOUTH EVERY DAY BEFORE BREAKFAST 06/27/21   [name], [name], DO  nicotine (NICODERM CQ - DOSED IN MG/24 HOURS) 14 mg/24hr patch Place 1 patch (14 mg total) onto the skin daily. 07/06/19   [name], MD  nicotine polacrilex (NICORETTE) 2 MG gum Take 1 each (2 mg total) by mouth as needed for smoking cessation. 07/06/19   [name], MD  Olopatadine HCl (PATADAY) 0.2 % SOLN Apply 1 drop to eye daily. 05/31/17   [name], MD  omeprazole (PRILOSEC) 20 MG capsule TAKE 1 CAPSULE BY MOUTH EVERY DAY 07/06/19   [name], MD  ondansetron (ZOFRAN ODT) 4 MG disintegrating tablet Take 1 tablet (4 mg total) by mouth every 8 (eight) hours as needed for nausea or vomiting. 07/12/21   Coral Spikes, DO  pravastatin (PRAVACHOL) 40 MG tablet Take 1 tablet (40 mg total) by mouth daily. Please schedule office visit before next refill. 11/25/20   Daisy Floro, DO  selenium sulfide (SELSUN) 2.5 % shampoo Apply 1 application topically daily as needed for irritation. 11/25/20   Daisy Floro, DO  vitamin B-12 (CYANOCOBALAMIN) 1000 MCG tablet Take 1,000 mcg by mouth daily.    [provider]    Allergies Patient has no known allergies.  Family History  Problem Relation Age of Onset   Stomach cancer Other    Colitis Other    Diabetes Other     Social History Social History   Tobacco Use   Smoking status: Every Day    Packs/day: 0.50    Types: Cigarettes    Start date: 10/08/1977   Smokeless tobacco: Never   Tobacco comments:    Previous 2 ppd smoker.    Vaping Use   Vaping Use: Never used   Substance Use Topics   Alcohol use: No    Alcohol/week: 0.0 standard drinks   Drug use: No    Review of Systems   Review of Systems  Constitutional:  Negative for chills and fever.  Eyes:  Negative for visual disturbance.  Respiratory:  Negative for shortness of breath.   Cardiovascular:  Positive for chest pain.  Gastrointestinal:  Negative for abdominal pain, nausea and vomiting.  Neurological:  Positive for headaches. Negative for weakness and numbness.  All other systems reviewed and are negative.  Physical Exam Updated Vital Signs BP 107/71 (BP Location: Left Arm)   Pulse (!) 59   Temp 98 F (36.7 C) (Oral)   Resp 14   Ht 5\' 2"  (  1.575 m)   Wt 65.8 kg   SpO2 98%   BMI 26.52 kg/m   Physical Exam Vitals and nursing note reviewed.  Constitutional:      General: She is not in acute distress.    Appearance: Normal appearance.  HENT:     Head: Normocephalic and atraumatic.  Eyes:     General: No scleral icterus.    Conjunctiva/sclera: Conjunctivae normal.  Pulmonary:     Effort: Pulmonary effort is normal. No respiratory distress.     Breath sounds: No stridor.  Abdominal:     General: There is no distension.     Palpations: Abdomen is soft.     Tenderness: There is no abdominal tenderness.  Musculoskeletal:        General: No deformity or signs of injury.     Cervical back: Normal range of motion.  Skin:    General: Skin is dry.     Coloration: Skin is not jaundiced or pale.  Neurological:     General: No focal deficit present.     Mental Status: She is alert and oriented to person, place, and time. Mental status is at baseline.     Comments: Aox3, nml speech  PERRL, EOMI, face symmetric, nml tongue movement  5/5 strength in the BL upper and lower extremities  Sensation grossly intact in the BL upper and lower extremities  Finger-nose-finger intact BL   Psychiatric:        Mood and Affect: Mood normal.        Behavior: Behavior normal.      LABS (all labs ordered are listed, but only abnormal results are displayed)  Labs Reviewed  COMPREHENSIVE METABOLIC PANEL - Abnormal; Notable for the following components:      Result Value   CO2 20 (*)    BUN 26 (*)    Creatinine, Ser 1.09 (*)    GFR, Estimated 60 (*)    All other components within normal limits  CBC WITH DIFFERENTIAL/PLATELET - Abnormal; Notable for the following components:   MCV 101.0 (*)    All other components within normal limits  URINALYSIS, ROUTINE W REFLEX MICROSCOPIC - Abnormal; Notable for the following components:   Color, Urine STRAW (*)    APPearance CLEAR (*)    All other components within normal limits  RESP PANEL BY RT-PCR (FLU A&B, COVID) ARPGX2  LIPASE, BLOOD  TROPONIN I (HIGH SENSITIVITY)   ____________________________________________  EKG NSR, nml axis, nml intervals, no acute ischemic changes   ____________________________________________  RADIOLOGY Almeta Monas, personally viewed and evaluated these images (plain radiographs) as part of my medical decision making, as well as reviewing the written report by the radiologist.  ED MD interpretation:     I reviewed the CXR which does not show any acute cardiopulmonary process  I reviewed the CT scan of the brain which does not show any acute intracranial process  ____________________________________________   PROCEDURES  Procedure(s) performed (including Critical Care):  Procedures   ____________________________________________   INITIAL IMPRESSION / ASSESSMENT AND PLAN / ED COURSE 55 year old female presents with 2 weeks of headache.  Headache is intermittent, it is usually worse in the morning but with no other associated neurologic symptoms nausea or vomiting.  She has been taking many Bcs which apparently resolved the pain.  She has no other neurologic symptoms.  Patient also complaining of some burning in her feet which has been chronic as well as intermittent  chest pain.  Chest pain is not  exertional she has no other associated symptoms with it and she is not currently having chest pain.  Vital signs within normal limits.  Chest x-ray and CT head were obtained which are normal.  Her labs are also reassuring, troponin is negative.  On exam she has no neurologic findings.  I suspect that her headaches could be related to analgesic rebound given the degree of over-the-counter meds she is taking.  I advised that she discontinue this and start taking Reglan as needed.  I did advise that if after this intervention her headaches are still daily she should follow-up with her primary care provider and potentially neurology as she may need an MRI to rule out mass not otherwise seen on CT.  Patient was comfortable with the plan.     Clinical Course as of 07/21/21 0020  Thu Jul 20, 2021  1947 Creatinine(!): 1.09 baseline [KM]    Clinical Course User Index [KM] Rada Hay, MD     ____________________________________________   FINAL CLINICAL IMPRESSION(S) / ED DIAGNOSES  Final diagnoses:  Nonintractable headache, unspecified chronicity pattern, unspecified headache type     ED Discharge Orders          Ordered    metoCLOPramide (REGLAN) 10 MG tablet  3 times daily with meals        07/20/21 2038             Note:  This document was prepared using Dragon voice recognition software and may include unintentional dictation errors.    Rada Hay, MD 07/21/21 [phone]

## 2021-07-26 ENCOUNTER — Other Ambulatory Visit: Payer: Self-pay | Admitting: Family Medicine

## 2021-08-11 IMAGING — CR DG CERVICAL SPINE COMPLETE 4+V
5 series · 5 of 5 positions shown · non-contrast
Comparison: None.

CLINICAL DATA: Neck pain radiating into the left shoulder and arm
for 3 weeks.

EXAM:
CERVICAL SPINE - COMPLETE 4+ VIEW

[w c-spine lat]
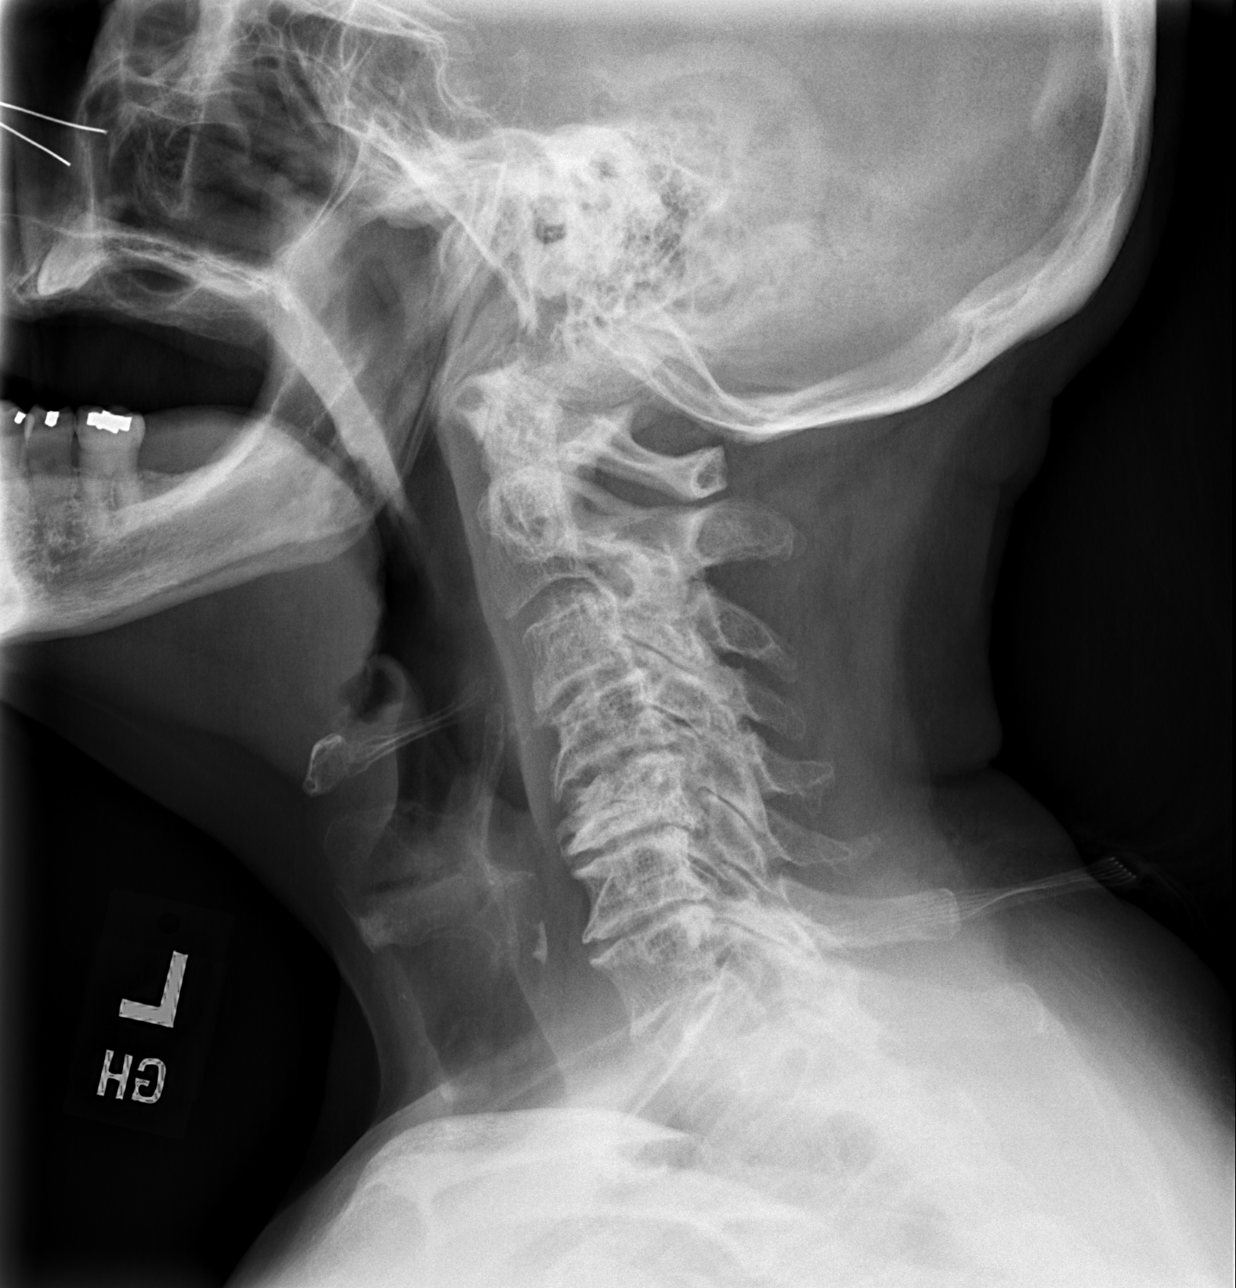

[w c-spine oblique (1 of 2)]
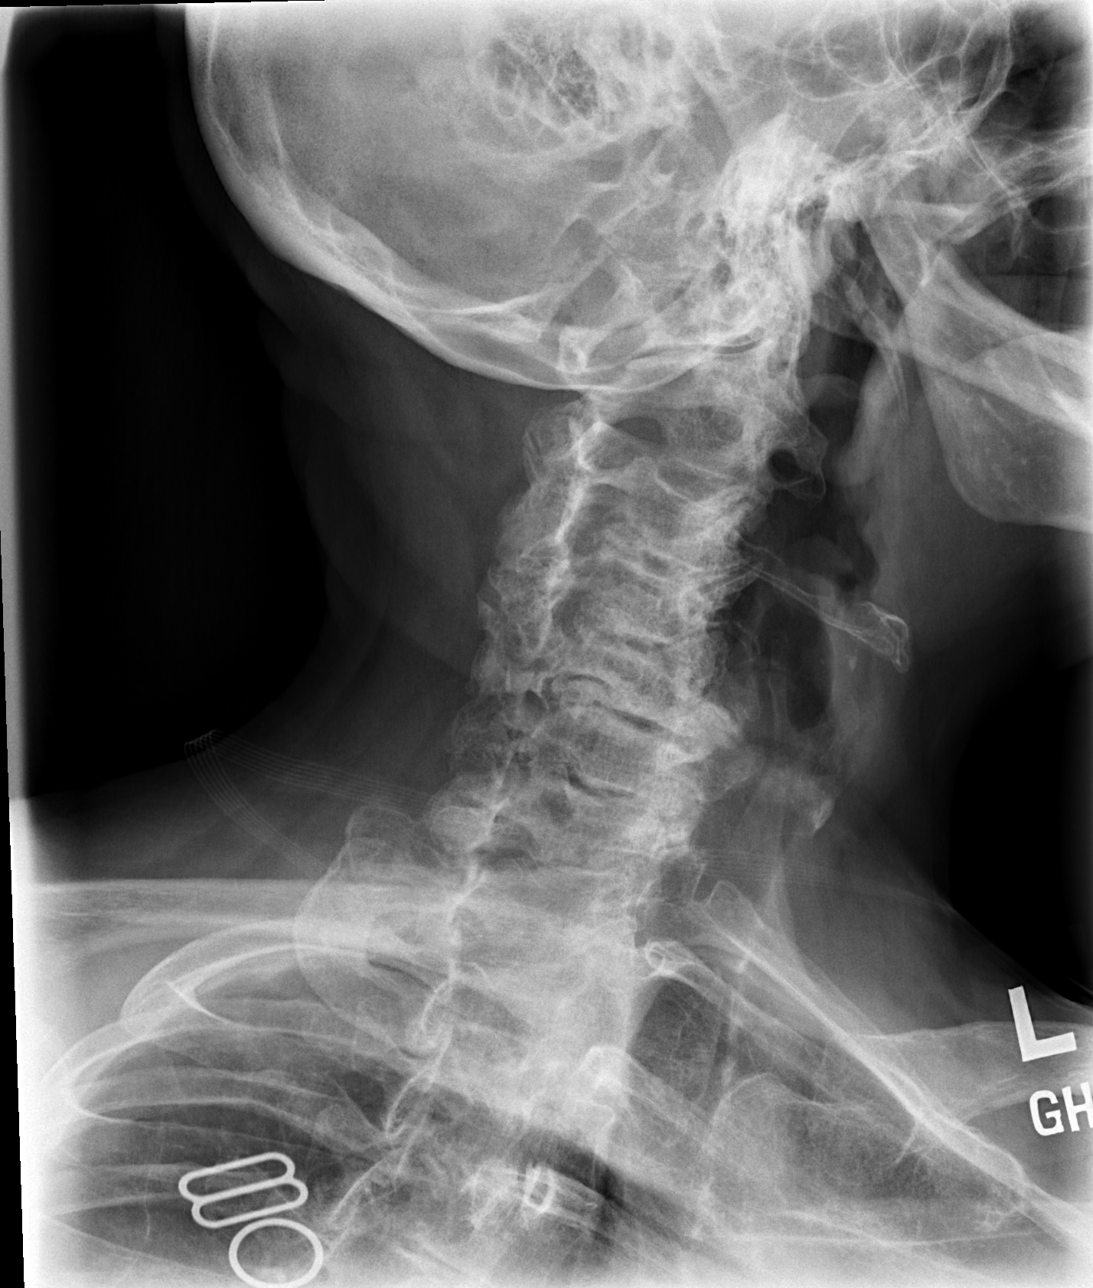

[w c-spine oblique (2 of 2)]
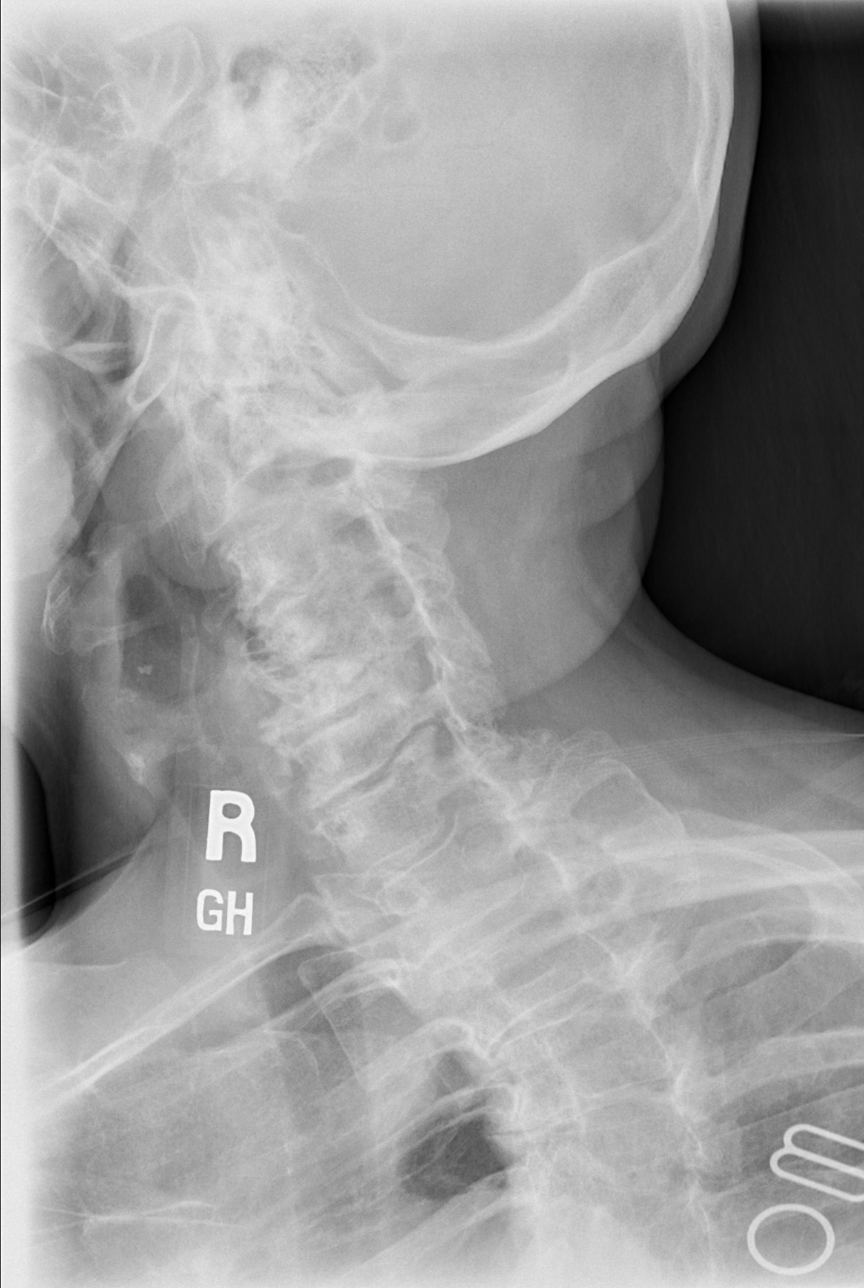

[w c-spine a.p. *]
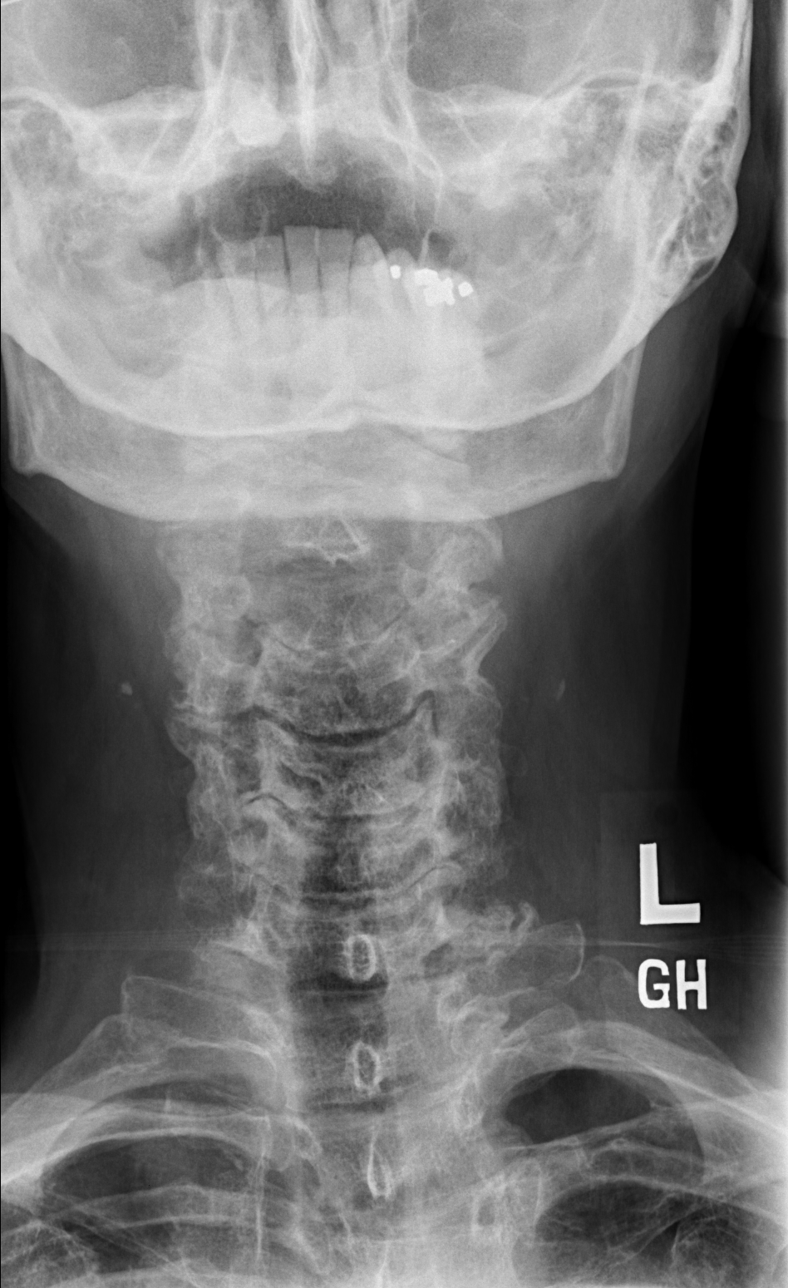

[w c-spine odontoid *]
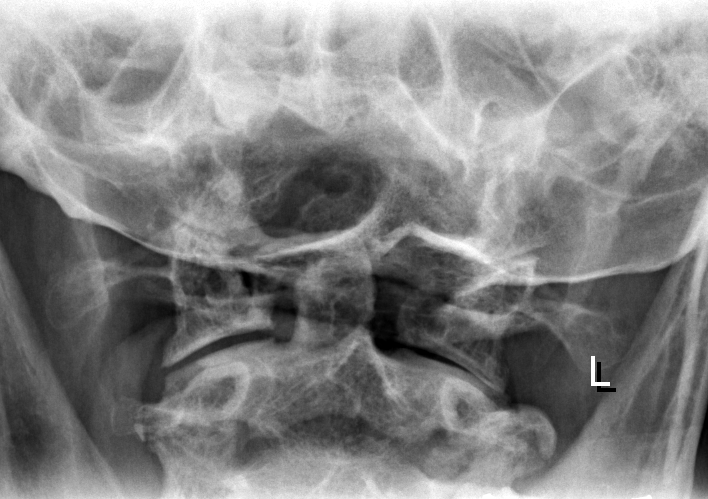

[5 of 5 positions shown; findings below may reference images not displayed]

FINDINGS: The prevertebral soft tissues are normal. There is straightening of
the usual cervical lordosis with a mild retrolisthesis at C5-6.
There is multilevel spondylosis with disc space narrowing, uncinate
spurring and facet hypertrophy, greatest from C3-4 through C6-7.
Oblique views demonstrate moderate osseous foraminal narrowing
bilaterally at C5-6 and C6-7. There is no evidence of acute
fracture.
IMPRESSION: Multilevel cervical spondylosis with osseous foraminal narrowing
which could contribute to the patient's symptoms. No acute osseous
findings or significant malalignment.

## 2022-03-13 ENCOUNTER — Encounter: Payer: Self-pay | Admitting: *Deleted

## 2022-03-26 ENCOUNTER — Other Ambulatory Visit: Payer: Self-pay

## 2022-03-26 DIAGNOSIS — J449 Chronic obstructive pulmonary disease, unspecified: Secondary | ICD-10-CM

## 2022-03-26 MED ORDER — ALBUTEROL SULFATE HFA 108 (90 BASE) MCG/ACT IN AERS: INHALATION_SPRAY | RESPIRATORY_TRACT | 1 refills | Status: DC

## 2022-03-26 MED ORDER — PRAVASTATIN SODIUM 40 MG PO TABS: 40.0000 mg | ORAL_TABLET | Freq: Every day | ORAL | 3 refills | Status: DC

## 2022-04-13 ENCOUNTER — Ambulatory Visit (INDEPENDENT_AMBULATORY_CARE_PROVIDER_SITE_OTHER): Payer: 59 | Admitting: Family Medicine

## 2022-04-13 ENCOUNTER — Encounter: Payer: Self-pay | Admitting: Family Medicine

## 2022-04-13 VITALS — BP 118/76 | HR 78 | Wt 145.0 lb

## 2022-04-13 DIAGNOSIS — Z0000 Encounter for general adult medical examination without abnormal findings: Secondary | ICD-10-CM | POA: Diagnosis not present

## 2022-04-13 DIAGNOSIS — E039 Hypothyroidism, unspecified: Secondary | ICD-10-CM

## 2022-04-13 DIAGNOSIS — Z1211 Encounter for screening for malignant neoplasm of colon: Secondary | ICD-10-CM | POA: Diagnosis not present

## 2022-04-13 DIAGNOSIS — E785 Hyperlipidemia, unspecified: Secondary | ICD-10-CM

## 2022-04-13 DIAGNOSIS — J449 Chronic obstructive pulmonary disease, unspecified: Secondary | ICD-10-CM

## 2022-04-13 DIAGNOSIS — K219 Gastro-esophageal reflux disease without esophagitis: Secondary | ICD-10-CM

## 2022-04-13 MED ORDER — PRAVASTATIN SODIUM 40 MG PO TABS: 40.0000 mg | ORAL_TABLET | Freq: Every day | ORAL | 3 refills | Status: DC

## 2022-04-13 MED ORDER — LEVOTHYROXINE SODIUM 100 MCG PO TABS: ORAL_TABLET | ORAL | 1 refills | Status: DC

## 2022-04-13 MED ORDER — IPRATROPIUM BROMIDE 0.06 % NA SOLN: 2.0000 | Freq: Four times a day (QID) | NASAL | 0 refills | Status: DC | PRN

## 2022-04-13 MED ORDER — ALBUTEROL SULFATE HFA 108 (90 BASE) MCG/ACT IN AERS: INHALATION_SPRAY | RESPIRATORY_TRACT | 1 refills | Status: DC

## 2022-04-13 MED ORDER — OMEPRAZOLE 20 MG PO CPDR: 20.0000 mg | DELAYED_RELEASE_CAPSULE | Freq: Every day | ORAL | 3 refills | Status: DC

## 2022-04-13 NOTE — Assessment & Plan Note (Signed)
-  discontinued pepcid, started omeprazole 20 mg -GERD precautions discussed -maintain food diary -follow up in 1 month if symptoms not improving, no indication for EGD at this time

## 2022-04-13 NOTE — Patient Instructions (Addendum)
It was great seeing you today!  Today we discussed many things. I am glad that you are doing well! I have sent refills for all your medications. Please make sure to eat a balanced, healthy diet. I will let you know of  any abnormal results from the blood work performed today.  Make sure to avoid sodas, spicy foods, tomato sauce or any foods that worsen your reflux symptoms. Limit alcohol. Please keep a journal of all the foods that you eat and note when you get symptoms. Please make sure to keep at least a 3 hour gap in between your last meal of the day and bedtime. Follow up with me in 1 month for this.   I have placed a GI referral for the colonoscopy, please contact us if you do not hear from them within 2 weeks.  Please follow up at your next scheduled appointment, if anything arises between now and then, please don't hesitate to contact our office.   Thank you for allowing [place] to be a part of your medical care!  Thank you, Dr. Larae Grooms

## 2022-04-13 NOTE — Progress Notes (Signed)
    SUBJECTIVE:   CHIEF COMPLAINT / HPI:   Patient presents for physical, denies any concerns. She now works 2 jobs as a single parent. Just started her second job 3 weeks ago. Kids are 67 and 58 years old, she feels like she is doing what she has to do until she gets caught up. Her main concern is that she needs refills on her medication.   Also has a history of GERD. Has been taking pepcid. But now she feels like she needs to take 2 to make it better  and even then it does not always help. Nexium has helped her symptoms resolve. Denies epigastric pain or dysphagia but feels reflux sensation. Usually occurs after she eats and before she goes to bed.   OBJECTIVE:   BP 118/76   Pulse 78   Wt 145 lb (65.8 kg)   SpO2 99%   BMI 26.52 kg/m   General: Patient well-appearing, in no acute distress. HEENT: PERRLA, non-tender thyroid, normal buccal mucosa, no evidence of cervical LAD CV: RRR, no murmurs or gallops auscultated Resp: CTAB, no wheezing, rales or rhonchi noted Abdomen: soft, nontender, nondistended, presence of bowel sounds Ext: no LE edema noted bilaterally Psych: mood appropriate  ASSESSMENT/PLAN:   GERD -discontinued pepcid, started omeprazole 20 mg -GERD precautions discussed -maintain food diary -follow up in 1 month if symptoms not improving, no indication for EGD at this time   Health maintenance -Med rec reviewed and updated appropriately -GI referral placed for colonoscopy  -PHQ-9 score 4 with negative question 9 reviewed.  -Discussed importance of PAP smear, instructed to follow up at earliest [place]

## 2022-04-17 LAB — LIPID PANEL
Chol/HDL Ratio: 3.9 ratio (ref 0.0–4.4)
Cholesterol, Total: 149 mg/dL (ref 100–199)
HDL: 38 mg/dL — ABNORMAL LOW (ref >39)
LDL Chol Calc (NIH): 80 mg/dL (ref 0–99)
Triglycerides: 183 mg/dL — ABNORMAL HIGH (ref 0–149)
VLDL Cholesterol Cal: 31 mg/dL (ref 5–40)

## 2022-04-17 LAB — BASIC METABOLIC PANEL
BUN/Creatinine Ratio: 20 (ref 9–23)
BUN: 20 mg/dL (ref 6–24)
CO2: 18 mmol/L — ABNORMAL LOW (ref 20–29)
Calcium: 9.1 mg/dL (ref 8.7–10.2)
Chloride: 107 mmol/L — ABNORMAL HIGH (ref 96–106)
Creatinine, Ser: 0.99 mg/dL (ref 0.57–1.00)
Glucose: 73 mg/dL (ref 70–99)
Potassium: 4.3 mmol/L (ref 3.5–5.2)
Sodium: 141 mmol/L (ref 134–144)
eGFR: 67 mL/min/{1.73_m2} (ref >59)

## 2022-04-17 LAB — T4, FREE: Free T4: 1.02 ng/dL (ref 0.82–1.77)

## 2022-04-17 LAB — TSH: TSH: 3.08 u[IU]/mL (ref 0.450–4.500)

## 2022-04-26 ENCOUNTER — Ambulatory Visit
Admission: EM | Admit: 2022-04-26 | Discharge: 2022-04-26 | Disposition: A | Discharge status: Home / Self Care | Payer: 59 | Attending: Emergency Medicine | Admitting: Emergency Medicine

## 2022-04-26 DIAGNOSIS — R21 Rash and other nonspecific skin eruption: Secondary | ICD-10-CM

## 2022-04-26 MED ORDER — PREDNISONE 10 MG (21) PO TBPK: ORAL_TABLET | Freq: Every day | ORAL | 0 refills | Status: DC

## 2022-04-26 MED ORDER — CETIRIZINE HCL 10 MG PO TABS: 10.0000 mg | ORAL_TABLET | Freq: Every day | ORAL | 0 refills | Status: DC

## 2022-04-26 NOTE — ED Provider Notes (Signed)
Helen Jones    CSN: [id number] Arrival date & time: 04/26/22  1246      History   Chief Complaint Chief Complaint  Patient presents with   Rash    HPI Helen Jones is a 56 y.o. female.  Patient presents with 3-day history of pruritic rash on both forearms.  No new products, medications, foods.  Treatment attempted with hydrocortisone cream and Benadryl.  No fever, chills, sore throat, cough, chest pain, shortness of breath, vomiting, diarrhea, or other symptoms.  Her medical history includes eczema, psoriasis, COPD, hypothyroidism.  The history is provided by the patient and medical records.    Past Medical History:  Diagnosis Date   Anxiety    COPD (chronic obstructive pulmonary disease) ([place])    Depression    Eczema    GERD (gastroesophageal reflux disease)    Hypothyroid     Patient Active Problem List   Diagnosis Date Noted   Arm pain, left 03/13/2021   Psoriasis 11/25/2020   Dark stools 04/26/2020   Hyperlipemia 03/25/2020   Chronic obstructive pulmonary disease ([place]) 03/25/2020   Encounter for screening for lung cancer 07/07/2019   Hypernatremia 06/17/2017   Fatigue 05/31/2017   Psoriasis of scalp 05/31/2017   Special screening for malignant neoplasms, colon    Benign neoplasm of ascending colon    Benign neoplasm of transverse colon    Benign neoplasm of sigmoid colon    Allergic rhinitis 12/25/2013   Overweight 04/20/2011   Eczema 04/20/2011   VAGINITIS, ATROPHIC, POSTMENOPAUSAL 08/16/2010   OSTEOARTHRITIS, SACROILIAC JOINT 10/20/2007   MIGRAINE NOS W/O INTRACTABLE MIGRAINE 05/12/2007   Anxiety state 01/13/2007   GERD 01/13/2007   Hypothyroidism 12/05/2006   Major depressive disorder, recurrent episode ([place]) 12/05/2006   Tobacco abuse 12/05/2006    Past Surgical History:  Procedure Laterality Date   CHOLECYSTECTOMY     COLONOSCOPY WITH PROPOFOL N/A 09/27/2015   Procedure: COLONOSCOPY WITH PROPOFOL;  Surgeon: [name], MD;   Location: [facility] ENDOSCOPY;  Service: Endoscopy;  Laterality: N/A;   TUBAL LIGATION      OB History   No obstetric history on file.      Home Medications    Prior to Admission medications   Medication Sig Start Date End Date Taking? Authorizing Provider  cetirizine (ZYRTEC ALLERGY) 10 MG tablet Take 1 tablet (10 mg total) by mouth daily. 04/26/22  Yes [name], NP  predniSONE (STERAPRED UNI-PAK 21 TAB) 10 MG (21) TBPK tablet Take by mouth daily. As directed 04/26/22  Yes [name], NP  albuterol (VENTOLIN HFA) 108 (90 Base) MCG/ACT inhaler INHALE 2 PUFFS INTO THE LUNGS EVERY 4 (FOUR) HOURS AS NEEDED FOR WHEEZING OR SHORTNESS OF BREATH. 04/13/22   [name], [name], DO  buPROPion (WELLBUTRIN) 75 MG tablet TAKE 1 TABLET BY MOUTH TWICE A DAY Patient not taking: Reported on 04/13/2022 11/22/20   [name], DO  Calcium Carbonate-Vitamin D3 (CALCIUM 600/VITAMIN D) 600-400 MG-UNIT TABS Take 2 tablets by mouth daily with meal. 06/05/17   Gonfa, Charlesetta Ivory, MD  escitalopram (LEXAPRO) 10 MG tablet Take 1 tablet (10 mg total) by mouth daily. Patient not taking: Reported on 04/13/2022 07/06/19   [name], MD  famotidine (PEPCID) 20 MG tablet Take 1 tablet (20 mg total) by mouth daily. 11/25/20   [name], DO  ipratropium (ATROVENT) 0.06 % nasal spray Place 2 sprays into both nostrils 4 (four) times daily as needed for rhinitis. 04/13/22   [name], DO  levothyroxine (SYNTHROID) 100 MCG tablet TAKE 1 TABLET BY MOUTH EVERY DAY BEFORE BREAKFAST 04/13/22   [name], [name], DO  nicotine (NICODERM CQ - DOSED IN MG/24 HOURS) 14 mg/24hr patch Place 1 patch (14 mg total) onto the skin daily. 07/06/19   [name], MD  nicotine polacrilex (NICORETTE) 2 MG gum Take 1 each (2 mg total) by mouth as needed for smoking cessation. 07/06/19   [name], MD  Olopatadine HCl (PATADAY) 0.2 % SOLN Apply 1 drop to eye daily. 05/31/17   [name], MD  omeprazole (PRILOSEC) 20 MG capsule Take 1 capsule (20 mg  total) by mouth daily. 04/13/22   [name], [name], DO  pravastatin (PRAVACHOL) 40 MG tablet Take 1 tablet (40 mg total) by mouth daily. Please schedule office visit before next refill. 04/13/22   [name], [name], DO  vitamin B-12 (CYANOCOBALAMIN) 1000 MCG tablet Take 1,000 mcg by mouth daily.    [provider]    Family History Family History  Problem Relation Age of Onset   Stomach cancer Other    Colitis Other    Diabetes Other     Social History Social History   Tobacco Use   Smoking status: Every Day    Packs/day: 0.50    Types: Cigarettes    Start date: 10/08/1977    Passive exposure: Current   Smokeless tobacco: Never   Tobacco comments:    Previous 2 ppd smoker.    Vaping Use   Vaping Use: Never used  Substance Use Topics   Alcohol use: No    Alcohol/week: 0.0 standard drinks of alcohol   Drug use: No     Allergies   Patient has no known allergies.   Review of Systems Review of Systems  Constitutional:  Negative for chills and fever.  HENT:  Negative for ear pain and sore throat.   Respiratory:  Negative for cough and shortness of breath.   Cardiovascular:  Negative for chest pain and palpitations.  Gastrointestinal:  Negative for diarrhea and vomiting.  Skin:  Positive for color change and rash.  All other systems reviewed and are negative.    Physical Exam Triage Vital Signs ED Triage Vitals  Enc Vitals Group     BP 04/26/22 1310 120/71     Pulse Rate 04/26/22 1310 71     Resp 04/26/22 1310 18     Temp 04/26/22 1310 98.4 F (36.9 C)     Temp src --      SpO2 04/26/22 1310 98 %     Weight --      Height --      Head Circumference --      Peak Flow --      Pain Score 04/26/22 1326 0     Pain Loc --      Pain Edu? --      Excl. in [place]? --    No data found.  Updated Vital Signs BP 120/71   Pulse 71   Temp 98.4 F (36.9 C)   Resp 18   SpO2 98%   Visual Acuity Right Eye Distance:   Left Eye Distance:   Bilateral Distance:    Right  Eye Near:   Left Eye Near:    Bilateral Near:     Physical Exam Vitals and nursing note reviewed.  Constitutional:      General: She is not in acute distress.    Appearance: Normal appearance. She is well-developed. She is not ill-appearing.  HENT:     Mouth/Throat:     Mouth: Mucous membranes are moist.  Cardiovascular:     Rate and Rhythm: Normal rate and regular rhythm.     Heart sounds: Normal heart sounds.  Pulmonary:     Effort: Pulmonary effort is normal. No respiratory distress.     Breath sounds: Normal breath sounds.  Musculoskeletal:        General: No swelling. Normal range of motion.     Cervical back: Neck supple.  Skin:    General: Skin is warm and dry.     Capillary Refill: Capillary refill takes less than 2 seconds.     Findings: Rash present.     Comments: Erythematous maculopapular rash on bilateral forearms.  No open wounds or drainage.  Neurological:     Mental Status: She is alert.     Sensory: No sensory deficit.     Motor: No weakness.  Psychiatric:        Mood and Affect: Mood normal.        Behavior: Behavior normal.      UC Treatments / Results  Labs (all labs ordered are listed, but only abnormal results are displayed) Labs Reviewed - No data to display  EKG   Radiology No results found.  Procedures Procedures (including critical care time)  Medications Ordered in UC Medications - No data to display  Initial Impression / Assessment and Plan / UC Course  I have reviewed the triage vital signs and the nursing notes.  Pertinent labs & imaging results that were available during my care of the patient were reviewed by me and considered in my medical decision making (see chart for details).    Rash.  Patient has a pruritic rash on both forearms.  No known cause.  No systemic symptoms.  Afebrile and vital signs are stable.  Patient is well-appearing.  Treating with prednisone taper and Zyrtec.  Education provided on rash.  Instructed  patient to follow-up with her PCP if her symptoms are not improving.  She agrees to plan of care.  Final Clinical Impressions(s) / UC Diagnoses   Final diagnoses:  Rash     Discharge Instructions      Take the prednisone and cetirizine as directed.  Follow up with your primary care provider if your symptoms are not improving.        ED Prescriptions     Medication Sig Dispense Auth. Provider   predniSONE (STERAPRED UNI-PAK 21 TAB) 10 MG (21) TBPK tablet Take by mouth daily. As directed 21 tablet [name], NP   cetirizine (ZYRTEC ALLERGY) 10 MG tablet Take 1 tablet (10 mg total) by mouth daily. 14 tablet [name], NP      PDMP not reviewed this encounter.   [name], NP 04/26/22 1400

## 2022-04-26 NOTE — Discharge Instructions (Addendum)
Take the prednisone and cetirizine as directed.  Follow up with your primary care provider if your symptoms are not improving.

## 2022-04-26 NOTE — ED Triage Notes (Signed)
Pt presents with rash on BUE x 3 days.  Has applied hydrocortisone cream and benadryl with no relief.  Itching and burning.

## 2022-06-06 ENCOUNTER — Ambulatory Visit
Admission: EM | Admit: 2022-06-06 | Discharge: 2022-06-06 | Disposition: A | Discharge status: Home / Self Care | Payer: 59

## 2022-06-06 DIAGNOSIS — J302 Other seasonal allergic rhinitis: Secondary | ICD-10-CM | POA: Diagnosis not present

## 2022-06-06 NOTE — ED Triage Notes (Addendum)
Patient to Urgent Care with complaints of headache, nausea, sore throat, reports at times she feels lightheaded. Also complains of bilateral eye itching. Symptoms started on Saturday.   States she was recently seen for an allergic reaction to her perfume. Completed prednisone but thinks that the rash is returning and complaining of itching on bilateral arms.

## 2022-06-06 NOTE — Discharge Instructions (Addendum)
You may use OTC nasal/sinus decongestants such as Sudafed, or anti-histamines such as Zyrtec or Claritin or generic equivalents for your symptoms.  Avoid using BC powders for your headache to avoid stomach irritation. Always use with food.

## 2022-06-06 NOTE — ED Provider Notes (Signed)
[name]    CSN: [id number] Arrival date & time: 06/06/22  1042      History   Chief Complaint Chief Complaint  Patient presents with  . Headache  . Nausea    HPI  Helen Jones is a 56 y.o. female.   Presents with c/o HA since Saturday, worsening Sunday. She has been taking excedrin and BC powders for the headache which has helped to alleviate her symptoms. She thinks she took 69 BC between Saturday and today.  Additional symptoms of:  - "Sick to my stomach" since Monday. - "Eyes feel like they have sand in them" - Sore throat starting Saturday  Denies fever or respiratory congestion.  Home covid test admin'd last night is negative  She also reports allergic reaction to spray on perfume at [employer] about 3 weeks ago. Itching and redness on forearms where the perfume was sprayed. She was prescribed prednisone for this which resolved the symptoms. Today reports "itching coming back" and wonders if she needs more prednisone.  She reports hx of stomach issues including epigastric pain and "swelling" and states she is awaiting scheduling for colonoscopy for "having trouble going to the bathroom".  Past Medical History:  Diagnosis Date  . Anxiety   . COPD (chronic obstructive pulmonary disease) ([place])   . Depression   . Eczema   . GERD (gastroesophageal reflux disease)   . Hypothyroid     Patient Active Problem List   Diagnosis Date Noted  . Arm pain, left 03/13/2021  . Psoriasis 11/25/2020  . Dark stools 04/26/2020  . Hyperlipemia 03/25/2020  . Chronic obstructive pulmonary disease ([place]) 03/25/2020  . Encounter for screening for lung cancer 07/07/2019  . Hypernatremia 06/17/2017  . Fatigue 05/31/2017  . Psoriasis of scalp 05/31/2017  . Special screening for malignant neoplasms, colon   . Benign neoplasm of ascending colon   . Benign neoplasm of transverse colon   . Benign neoplasm of sigmoid colon   . Allergic rhinitis 12/25/2013  .  Overweight 04/20/2011  . Eczema 04/20/2011  . VAGINITIS, ATROPHIC, POSTMENOPAUSAL 08/16/2010  . OSTEOARTHRITIS, SACROILIAC JOINT 10/20/2007  . MIGRAINE NOS W/O INTRACTABLE MIGRAINE 05/12/2007  . Anxiety state 01/13/2007  . GERD 01/13/2007  . Hypothyroidism 12/05/2006  . Major depressive disorder, recurrent episode ([place]) 12/05/2006  . Tobacco abuse 12/05/2006    Past Surgical History:  Procedure Laterality Date  . CHOLECYSTECTOMY    . COLONOSCOPY WITH PROPOFOL N/A 09/27/2015   Procedure: COLONOSCOPY WITH PROPOFOL;  Surgeon: [name], MD;  Location: [facility] ENDOSCOPY;  Service: Endoscopy;  Laterality: N/A;  . TUBAL LIGATION      OB History   No obstetric history on file.      Home Medications    Prior to Admission medications   Medication Sig Start Date End Date Taking? Authorizing Provider  albuterol (VENTOLIN HFA) 108 (90 Base) MCG/ACT inhaler INHALE 2 PUFFS INTO THE LUNGS EVERY 4 (FOUR) HOURS AS NEEDED FOR WHEEZING OR SHORTNESS OF BREATH. 04/13/22   [name], [name], DO  buPROPion (WELLBUTRIN) 75 MG tablet TAKE 1 TABLET BY MOUTH TWICE A DAY Patient not taking: Reported on 04/13/2022 11/22/20   [name], DO  Calcium Carbonate-Vitamin D3 (CALCIUM 600/VITAMIN D) 600-400 MG-UNIT TABS Take 2 tablets by mouth daily with meal. 06/05/17   [name], MD  cetirizine (ZYRTEC ALLERGY) 10 MG tablet Take 1 tablet (10 mg total) by mouth daily. 04/26/22   [name], NP  escitalopram (LEXAPRO) 10 MG  tablet Take 1 tablet (10 mg total) by mouth daily. Patient not taking: Reported on 04/13/2022 07/06/19   [name], MD  famotidine (PEPCID) 20 MG tablet Take 1 tablet (20 mg total) by mouth daily. 11/25/20   [name], DO  ipratropium (ATROVENT) 0.06 % nasal spray Place 2 sprays into both nostrils 4 (four) times daily as needed for rhinitis. 04/13/22   [name], [name], DO  levothyroxine (SYNTHROID) 100 MCG tablet TAKE 1 TABLET BY MOUTH EVERY DAY BEFORE BREAKFAST 04/13/22   [name], [name], DO   nicotine (NICODERM CQ - DOSED IN MG/24 HOURS) 14 mg/24hr patch Place 1 patch (14 mg total) onto the skin daily. 07/06/19   [name], MD  nicotine polacrilex (NICORETTE) 2 MG gum Take 1 each (2 mg total) by mouth as needed for smoking cessation. 07/06/19   [name], MD  Olopatadine HCl (PATADAY) 0.2 % SOLN Apply 1 drop to eye daily. 05/31/17   [name], MD  omeprazole (PRILOSEC) 20 MG capsule Take 1 capsule (20 mg total) by mouth daily. 04/13/22   [name], [name], DO  pravastatin (PRAVACHOL) 40 MG tablet Take 1 tablet (40 mg total) by mouth daily. Please schedule office visit before next refill. 04/13/22   [name], [name], DO  predniSONE (STERAPRED UNI-PAK 21 TAB) 10 MG (21) TBPK tablet Take by mouth daily. As directed 04/26/22   [name], NP  vitamin B-12 (CYANOCOBALAMIN) 1000 MCG tablet Take 1,000 mcg by mouth daily.    [provider]    Family History Family History  Problem Relation Age of Onset  . Stomach cancer Other   . Colitis Other   . Diabetes Other     Social History Social History   Tobacco Use  . Smoking status: Every Day    Packs/day: 0.50    Types: Cigarettes    Start date: 10/08/1977    Passive exposure: Current  . Smokeless tobacco: Never  . Tobacco comments:    Previous 2 ppd smoker.    Vaping Use  . Vaping Use: Never used  Substance Use Topics  . Alcohol use: No    Alcohol/week: 0.0 standard drinks of alcohol  . Drug use: No     Allergies   Patient has no known allergies.   Review of Systems Review of Systems  Constitutional: Negative.   HENT:  Positive for sinus pain. Negative for congestion, ear pain and rhinorrhea.   Eyes:  Positive for itching.  Respiratory: Negative.    Skin:  Negative for rash.     Physical Exam Triage Vital Signs ED Triage Vitals  Enc Vitals Group     BP      Pulse      Resp      Temp      Temp src      SpO2      Weight      Height      Head Circumference      Peak Flow      Pain Score       Pain Loc      Pain Edu?      Excl. in [place]?    No data found.  Updated Vital Signs BP 121/71   Pulse 78   Temp 97.9 F (36.6 C)   Resp 18   SpO2 96%   Visual Acuity Right Eye Distance:   Left Eye Distance:   Bilateral Distance:    Right Eye Near:   Left  Eye Near:    Bilateral Near:     Physical Exam Vitals reviewed.  Constitutional:      Appearance: Normal appearance.  HENT:     Head: Normocephalic.     Nose:     Right Sinus: No maxillary sinus tenderness or frontal sinus tenderness.     Left Sinus: No maxillary sinus tenderness or frontal sinus tenderness.     Mouth/Throat:     Mouth: Mucous membranes are moist.     Pharynx: Oropharynx is clear.  Eyes:     Conjunctiva/sclera: Conjunctivae normal.     Pupils: Pupils are equal, round, and reactive to light.  Pulmonary:     Effort: Pulmonary effort is normal. No respiratory distress.     Breath sounds: Normal breath sounds. No wheezing or rales.  Skin:    General: Skin is warm and dry.  Neurological:     General: No focal deficit present.     Mental Status: She is alert and oriented to person, place, and time.  Psychiatric:        Mood and Affect: Mood normal.        Behavior: Behavior normal.     UC Treatments / Results  Labs (all labs ordered are listed, but only abnormal results are displayed) Labs Reviewed - No data to display  EKG   Radiology No results found.  Procedures Procedures (including critical care time)  Medications Ordered in UC Medications - No data to display  Initial Impression / Assessment and Plan / UC Course  I have reviewed the triage vital signs and the nursing notes.  Pertinent labs & imaging results that were available during my care of the patient were reviewed by me and considered in my medical decision making (see chart for details).   Exam is unremarkable. Negative for nasal congestion, cough or other respiratory symptoms. LCAB. No sinus tenderness. Negative fever or  other systemic symptoms.   I suspect headache 2/2 sinus congestion with allergic etiology and recommended use of OTC medications for symptoms relief. I addressed her apparent frequent use BC powders and suggest she avoid if possible. However, if necessary, to use with food to avoid gastric irritation.  Declined her request for refill of prednisone as no visible rash is present on her arms. Suggested use of OTC antihistamine could help with her reported itching.  Patient requested a work note.  Final Clinical Impressions(s) / UC Diagnoses   Final diagnoses:  None   Discharge Instructions   None    ED Prescriptions   None    PDMP not reviewed this encounter.   Rose Phi, [place] 06/06/22 1147

## 2022-07-10 ENCOUNTER — Other Ambulatory Visit: Payer: Self-pay | Admitting: Family Medicine

## 2022-08-23 ENCOUNTER — Ambulatory Visit
Admission: EM | Admit: 2022-08-23 | Discharge: 2022-08-23 | Discharge status: Against Medical Advice | Payer: Medicaid Other

## 2022-08-23 ENCOUNTER — Ambulatory Visit
Admission: EM | Admit: 2022-08-23 | Discharge: 2022-08-23 | Disposition: A | Discharge status: Home / Self Care | Payer: Commercial Managed Care - HMO | Attending: Urgent Care | Admitting: Urgent Care

## 2022-08-23 DIAGNOSIS — M546 Pain in thoracic spine: Secondary | ICD-10-CM

## 2022-08-23 MED ORDER — LIDOCAINE 5 % EX PTCH: 2.0000 | MEDICATED_PATCH | CUTANEOUS | 0 refills | Status: AC

## 2022-08-23 MED ORDER — CYCLOBENZAPRINE HCL 10 MG PO TABS: 10.0000 mg | ORAL_TABLET | Freq: Two times a day (BID) | ORAL | 0 refills | Status: AC | PRN

## 2022-08-23 NOTE — ED Triage Notes (Signed)
Pt. Presents to UC w/ c/o lower back pain that started 2 weeks ago. Pt. Endorses a burning and numbing sensation in her back.

## 2022-08-23 NOTE — Discharge Instructions (Addendum)
Follow up here or with your primary care provider if your symptoms are worsening or not improving.     

## 2022-08-23 NOTE — ED Provider Notes (Signed)
[name]    CSN: [id number] Arrival date & time: 08/23/22  1250      History   Chief Complaint No chief complaint on file.   HPI Helen Jones is a 56 y.o. female.   HPI  Presents to UC with complaint of mid thoracic back pain that started 2 weeks ago.  She also endorses burning and numbing sensation in her back.  Patient states she had a URI with cough which caused frequent hacking cough around the time that her pain began.  Past Medical History:  Diagnosis Date   Anxiety    COPD (chronic obstructive pulmonary disease) ([place])    Depression    Eczema    GERD (gastroesophageal reflux disease)    Hypothyroid     Patient Active Problem List   Diagnosis Date Noted   Arm pain, left 03/13/2021   Psoriasis 11/25/2020   Dark stools 04/26/2020   Hyperlipemia 03/25/2020   Chronic obstructive pulmonary disease ([place]) 03/25/2020   Encounter for screening for lung cancer 07/07/2019   Hypernatremia 06/17/2017   Fatigue 05/31/2017   Psoriasis of scalp 05/31/2017   Special screening for malignant neoplasms, colon    Benign neoplasm of ascending colon    Benign neoplasm of transverse colon    Benign neoplasm of sigmoid colon    Allergic rhinitis 12/25/2013   Overweight 04/20/2011   Eczema 04/20/2011   VAGINITIS, ATROPHIC, POSTMENOPAUSAL 08/16/2010   OSTEOARTHRITIS, SACROILIAC JOINT 10/20/2007   MIGRAINE NOS W/O INTRACTABLE MIGRAINE 05/12/2007   Anxiety state 01/13/2007   GERD 01/13/2007   Hypothyroidism 12/05/2006   Major depressive disorder, recurrent episode ([place]) 12/05/2006   Tobacco abuse 12/05/2006    Past Surgical History:  Procedure Laterality Date   CHOLECYSTECTOMY     COLONOSCOPY WITH PROPOFOL N/A 09/27/2015   Procedure: COLONOSCOPY WITH PROPOFOL;  Surgeon: [name], MD;  Location: [facility] ENDOSCOPY;  Service: Endoscopy;  Laterality: N/A;   TUBAL LIGATION      OB History   No obstetric history on file.      Home Medications    Prior  to Admission medications   Medication Sig Start Date End Date Taking? Authorizing Provider  albuterol (VENTOLIN HFA) 108 (90 Base) MCG/ACT inhaler INHALE 2 PUFFS INTO THE LUNGS EVERY 4 (FOUR) HOURS AS NEEDED FOR WHEEZING OR SHORTNESS OF BREATH. 04/13/22   [name], [name], DO  buPROPion (WELLBUTRIN) 75 MG tablet TAKE 1 TABLET BY MOUTH TWICE A DAY Patient not taking: Reported on 04/13/2022 11/22/20   [name], DO  Calcium Carbonate-Vitamin D3 (CALCIUM 600/VITAMIN D) 600-400 MG-UNIT TABS Take 2 tablets by mouth daily with meal. 06/05/17   [name], MD  cetirizine (ZYRTEC ALLERGY) 10 MG tablet Take 1 tablet (10 mg total) by mouth daily. 04/26/22   [name], NP  escitalopram (LEXAPRO) 10 MG tablet Take 1 tablet (10 mg total) by mouth daily. Patient not taking: Reported on 04/13/2022 07/06/19   [name], MD  famotidine (PEPCID) 20 MG tablet Take 1 tablet (20 mg total) by mouth daily. 11/25/20   [name], DO  ipratropium (ATROVENT) 0.06 % nasal spray Place 2 sprays into both nostrils 4 (four) times daily as needed for rhinitis. 04/13/22   [name], [name], DO  levothyroxine (SYNTHROID) 100 MCG tablet TAKE 1 TABLET BY MOUTH EVERY DAY BEFORE BREAKFAST 04/13/22   [name], [name], DO  nicotine (NICODERM CQ - DOSED IN MG/24 HOURS) 14 mg/24hr patch Place 1 patch (14 mg total) onto the skin daily.  07/06/19   [name], MD  nicotine polacrilex (NICORETTE) 2 MG gum Take 1 each (2 mg total) by mouth as needed for smoking cessation. 07/06/19   [name], MD  Olopatadine HCl (PATADAY) 0.2 % SOLN Apply 1 drop to eye daily. 05/31/17   [name], MD  omeprazole (PRILOSEC) 20 MG capsule TAKE 1 CAPSULE BY MOUTH EVERY DAY 07/10/22   [name], [name], DO  pravastatin (PRAVACHOL) 40 MG tablet Take 1 tablet (40 mg total) by mouth daily. Please schedule office visit before next refill. 04/13/22   [name], [name], DO  predniSONE (STERAPRED UNI-PAK 21 TAB) 10 MG (21) TBPK tablet Take by mouth daily. As directed 04/26/22    [name], NP  vitamin B-12 (CYANOCOBALAMIN) 1000 MCG tablet Take 1,000 mcg by mouth daily.    [provider]    Family History Family History  Problem Relation Age of Onset   Stomach cancer Other    Colitis Other    Diabetes Other     Social History Social History   Tobacco Use   Smoking status: Every Day    Packs/day: 0.50    Types: Cigarettes    Start date: 10/08/1977    Passive exposure: Current   Smokeless tobacco: Never   Tobacco comments:    Previous 2 ppd smoker.    Vaping Use   Vaping Use: Never used  Substance Use Topics   Alcohol use: No    Alcohol/week: 0.0 standard drinks of alcohol   Drug use: No     Allergies   Patient has no known allergies.   Review of Systems Review of Systems   Physical Exam Triage Vital Signs ED Triage Vitals  Enc Vitals Group     BP      Pulse      Resp      Temp      Temp src      SpO2      Weight      Height      Head Circumference      Peak Flow      Pain Score      Pain Loc      Pain Edu?      Excl. in [place]?    No data found.  Updated Vital Signs There were no vitals taken for this visit.  Visual Acuity Right Eye Distance:   Left Eye Distance:   Bilateral Distance:    Right Eye Near:   Left Eye Near:    Bilateral Near:     Physical Exam Vitals reviewed.  Constitutional:      Appearance: Normal appearance.  Musculoskeletal:        General: Tenderness present.  Skin:    General: Skin is warm and dry.  Neurological:     General: No focal deficit present.     Mental Status: She is alert and oriented to person, place, and time.  Psychiatric:        Mood and Affect: Mood normal.        Behavior: Behavior normal.      UC Treatments / Results  Labs (all labs ordered are listed, but only abnormal results are displayed) Labs Reviewed - No data to display  EKG   Radiology No results found.  Procedures Procedures (including critical care time)  Medications Ordered in  UC Medications - No data to display  Initial Impression / Assessment and Plan / UC Course  I have reviewed the  triage vital signs and the nursing notes.  Pertinent labs & imaging results that were available during my care of the patient were reviewed by me and considered in my medical decision making (see chart for details).   Thoracic muscle strain likely secondary to frequent cough during her URI recovery.  Recommending use of Aleve or other NSAID medication for relief of symptoms.  Will prescribe lidocaine patch and muscle relaxer to aid sleep.   Final Clinical Impressions(s) / UC Diagnoses   Final diagnoses:  None   Discharge Instructions   None    ED Prescriptions   None    PDMP not reviewed this encounter.   Rose Phi, [place] 08/23/22 1341

## 2022-09-25 ENCOUNTER — Ambulatory Visit (INDEPENDENT_AMBULATORY_CARE_PROVIDER_SITE_OTHER): Payer: Commercial Managed Care - HMO | Admitting: Family Medicine

## 2022-09-25 VITALS — BP 98/60 | HR 68 | Wt 135.0 lb

## 2022-09-25 DIAGNOSIS — Z1211 Encounter for screening for malignant neoplasm of colon: Secondary | ICD-10-CM

## 2022-09-25 DIAGNOSIS — M545 Low back pain, unspecified: Secondary | ICD-10-CM | POA: Insufficient documentation

## 2022-09-25 DIAGNOSIS — M5440 Lumbago with sciatica, unspecified side: Secondary | ICD-10-CM

## 2022-09-25 MED ORDER — BACLOFEN 10 MG PO TABS: 10.0000 mg | ORAL_TABLET | Freq: Three times a day (TID) | ORAL | 0 refills | Status: DC

## 2022-09-25 NOTE — Progress Notes (Signed)
    SUBJECTIVE:   CHIEF COMPLAINT / HPI:   Patient presents with back pain. 7 weeks ago she had a viral infection and when she coughed really back she believes that she pulls a muscle. She recovered from this and then 3 weeks ago she started to have back pain again, this seemed to the same type of back pain that she started with. She reports back pain on both sides. Aggravating factors include cough and certain movements. Relieving factors include heating pad and a massager she has at home. She takes ibuprofen which helps the pain as well. She was previously prescribed a muscle relaxer that helped as well but she has run out of this. Initially thought it was due to constipation but she has miralax which has helped but still has the back pain. Occasionally has radiating pain down part of her right leg. Denies groin paresthesia.   OBJECTIVE:   BP 98/60   Pulse 68   Wt 135 lb (61.2 kg)   SpO2 98%   BMI 24.69 kg/m   General: Patient well-appearing, in no acute distress. CV: RRR, no murmurs or gallops auscultated Resp: CTAB, no wheezing, rales or rhonchi noted Abdomen: soft, nontender, nondistended, presence of bowel sounds MSK: no CVA tenderness bilaterally, positive straight leg raise on the right, normal ROM, tight tissue texture changes not diffusely along the back more prominently T7-L3 with tenderness upon palpation of T11-L3 Neuro: gross sensation, normal gait   ASSESSMENT/PLAN:   Low back pain -likely secondary to MSK etiology, reassuringly no red flag symptoms. Also considered pyelo although less likely given lack of urinary or infectious symptoms along with lack of CVA tenderness. With component of sciatica as well. -sciatica rehab handout provided -conservative measures discussed including use of heating pad -prescribed baclofen to alleviate pain and allow patient to participate in physical activity and stretching with pain -follow up in 2 months, consider PT if appropriate at  that time   Health maintenance -Discussed importance of colon cancer screening, GI referral placed for colonoscopy  -Follow up in 2 months to discuss more health maintenance items including mammogram     Dhanvin Szeto Larae Grooms, [place]

## 2022-09-25 NOTE — Assessment & Plan Note (Signed)
-  likely secondary to MSK etiology, reassuringly no red flag symptoms. Also considered pyelo although less likely given lack of urinary or infectious symptoms along with lack of CVA tenderness. With component of sciatica as well. -sciatica rehab handout provided -conservative measures discussed including use of heating pad -prescribed baclofen to alleviate pain and allow patient to participate in physical activity and stretching with pain -follow up in 2 months, consider PT if appropriate at that time

## 2022-09-25 NOTE — Patient Instructions (Signed)
It was great seeing you today!  Today we discussed your back pain, this seems to be due to muscle strain with a component of sciatic nerve pain as we discussed. You may use salonpas and a heating pad to help with the pain. I have prescribed baclofen to take as well which can help. I have also added a handout to discuss some stretching exercises that can help.   Please follow up at your next scheduled appointment in 2 months, if anything arises between now and then, please don't hesitate to contact our office.   Thank you for allowing [place] to be a part of your medical care!  Thank you, Dr. Larae Grooms  Also a reminder of our clinic's no-show policy. Please make sure to arrive at least 15 minutes prior to your scheduled appointment time. Please try to cancel before 24 hours if you are not able to make it. If you no-show for 2 appointments then you will be receiving a warning letter. If you no-show after 3 visits, then you may be at risk of being dismissed from our clinic. This is to ensure that everyone is able to be seen in a timely manner. Thank you, we appreciate your assistance with this!

## 2022-10-02 ENCOUNTER — Telehealth: Payer: Self-pay

## 2022-10-02 DIAGNOSIS — Z86010 Personal history of colonic polyps: Secondary | ICD-10-CM

## 2022-10-02 DIAGNOSIS — K219 Gastro-esophageal reflux disease without esophagitis: Secondary | ICD-10-CM

## 2022-10-02 NOTE — Telephone Encounter (Signed)
Helen Jones called pt last week to schedule for screening colon... please call to schedule

## 2022-10-04 ENCOUNTER — Other Ambulatory Visit: Payer: Self-pay

## 2022-10-04 DIAGNOSIS — Z86010 Personal history of colonic polyps: Secondary | ICD-10-CM

## 2022-10-04 MED ORDER — NA SULFATE-K SULFATE-MG SULF 17.5-3.13-1.6 GM/177ML PO SOLN: 1.0000 | Freq: Once | ORAL | 0 refills | Status: AC

## 2022-10-04 NOTE — Telephone Encounter (Signed)
Gastroenterology Pre-Procedure Review  Request Date: 10/30/22 Requesting Physician: Dr. Allen Norris  PATIENT REVIEW QUESTIONS: The patient responded to the following health history questions as indicated:    1. Are you having any GI issues? yes (constipation can go a week or two without bowel movement. I've advised a 2day prep using Suprpe.  She has been drinking digestive tea and miralax gatorade but not helping with bowel movements.  She stated that food gets stuck, and experiences heartburn takes omeprazole '20mg'$  daily along with some tums avoids spicy foods.  Heartburn has gotten worse, said now even water gives heartburn.) 2. Do you have a personal history of Polyps? yes (2016 last colonoscopy polyps noted performed by Dr. Allen Norris) 3. Do you have a family history of Colon Cancer or Polyps?  Unsure mother and sister possibly colon cancer 4. Diabetes Mellitus? no 5. Joint replacements in the past 12 months?no 6. Major health problems in the past 3 months?no 7. Any artificial heart valves, MVP, or defibrillator? Pt does have COPD does not follow up with a pulmonologist    MEDICATIONS & ALLERGIES:    Patient reports the following regarding taking any anticoagulation/antiplatelet therapy:   Plavix, Coumadin, Eliquis, Xarelto, Lovenox, Pradaxa, Brilinta, or Effient? no Aspirin? no  Patient confirms/reports the following medications:  Current Outpatient Medications  Medication Sig Dispense Refill   albuterol (VENTOLIN HFA) 108 (90 Base) MCG/ACT inhaler INHALE 2 PUFFS INTO THE LUNGS EVERY 4 (FOUR) HOURS AS NEEDED FOR WHEEZING OR SHORTNESS OF BREATH. 8.5 g 1   baclofen (LIORESAL) 10 MG tablet Take 1 tablet (10 mg total) by mouth 3 (three) times daily. 30 each 0   buPROPion (WELLBUTRIN) 75 MG tablet TAKE 1 TABLET BY MOUTH TWICE A DAY (Patient not taking: Reported on 04/13/2022) 180 tablet 1   Calcium Carbonate-Vitamin D3 (CALCIUM 600/VITAMIN D) 600-400 MG-UNIT TABS Take 2 tablets by mouth daily with meal.  180 tablet 3   cetirizine (ZYRTEC ALLERGY) 10 MG tablet Take 1 tablet (10 mg total) by mouth daily. 14 tablet 0   escitalopram (LEXAPRO) 10 MG tablet Take 1 tablet (10 mg total) by mouth daily. (Patient not taking: Reported on 04/13/2022) 90 tablet 3   famotidine (PEPCID) 20 MG tablet Take 1 tablet (20 mg total) by mouth daily. 30 tablet 3   ipratropium (ATROVENT) 0.06 % nasal spray Place 2 sprays into both nostrils 4 (four) times daily as needed for rhinitis. 15 mL 0   levothyroxine (SYNTHROID) 100 MCG tablet TAKE 1 TABLET BY MOUTH EVERY DAY BEFORE BREAKFAST 90 tablet 1   nicotine (NICODERM CQ - DOSED IN MG/24 HOURS) 14 mg/24hr patch Place 1 patch (14 mg total) onto the skin daily. 28 patch 0   nicotine polacrilex (NICORETTE) 2 MG gum Take 1 each (2 mg total) by mouth as needed for smoking cessation. 100 tablet 0   Olopatadine HCl (PATADAY) 0.2 % SOLN Apply 1 drop to eye daily. 2.5 mL 1   omeprazole (PRILOSEC) 20 MG capsule TAKE 1 CAPSULE BY MOUTH EVERY DAY 90 capsule 1   pravastatin (PRAVACHOL) 40 MG tablet Take 1 tablet (40 mg total) by mouth daily. Please schedule office visit before next refill. 90 tablet 3   predniSONE (STERAPRED UNI-PAK 21 TAB) 10 MG (21) TBPK tablet Take by mouth daily. As directed 21 tablet 0   vitamin B-12 (CYANOCOBALAMIN) 1000 MCG tablet Take 1,000 mcg by mouth daily.     No current facility-administered medications for this visit.    Patient confirms/reports the following allergies:  No Known Allergies  No orders of the defined types were placed in this encounter.   AUTHORIZATION INFORMATION Primary Insurance: 1D#: Group #:  Secondary Insurance: 1D#: Group #:  SCHEDULE INFORMATION: Date: 10/30/22 Time: Location: [facility]

## 2022-10-04 NOTE — Telephone Encounter (Signed)
Patient has been informed that we will add an EGD with her colonoscopy for GERD.  Trish in Endo has been informed of add on procedure.

## 2022-10-04 NOTE — Addendum Note (Signed)
Addended by: Vanetta Mulders on: 10/04/2022 08:53 AM   Modules accepted: Orders

## 2022-10-23 ENCOUNTER — Ambulatory Visit
Admission: EM | Admit: 2022-10-23 | Discharge: 2022-10-23 | Disposition: A | Discharge status: Home / Self Care | Payer: Medicaid Other | Attending: Urgent Care | Admitting: Urgent Care

## 2022-10-23 DIAGNOSIS — J441 Chronic obstructive pulmonary disease with (acute) exacerbation: Secondary | ICD-10-CM | POA: Diagnosis not present

## 2022-10-23 DIAGNOSIS — J019 Acute sinusitis, unspecified: Secondary | ICD-10-CM

## 2022-10-23 DIAGNOSIS — B9789 Other viral agents as the cause of diseases classified elsewhere: Secondary | ICD-10-CM

## 2022-10-23 MED ORDER — PREDNISONE 20 MG PO TABS: 60.0000 mg | ORAL_TABLET | Freq: Every day | ORAL | 0 refills | Status: AC

## 2022-10-23 MED ORDER — AZITHROMYCIN 250 MG PO TABS: ORAL_TABLET | ORAL | 0 refills | Status: DC

## 2022-10-23 NOTE — ED Triage Notes (Signed)
Patient presents to UC for nasal congestion, HA, and chest congestion since Sunday. Last known fever yesterday. Taking sudafed. She states family has sinus infections.

## 2022-10-23 NOTE — Discharge Instructions (Addendum)
You have been diagnosed with a viral upper respiratory infection based on your symptoms and exam. Viral illnesses cannot be treated with antibiotics - they are self limiting - and you should find your symptoms resolving within a few days. Get plenty of rest and non-caffeinated fluids. Watch for signs of dehydration including reduced urine output and dark colored urine.  Given your history of COPD, an antibiotic has been ordered to reduce risk of poor outcome.  In addition, I have ordered corticosteroid therapy to reduce sinus inflammation.  This should also help with your cough and reduce the risk of COPD flare.  We recommend you use over-the-counter medications for symptom control including acetaminophen (Tylenol), ibuprofen (Advil/Motrin) or naproxen (Aleve) for throat pain, fever, chills or body aches. You may combine use of acetaminophen and ibuprofen/naproxen if needed.  Some patients find an pain-relieving throat spray such as Chloraseptic to be effective.  Also recommend  cold/cough medication containing a cough suppressant such as dextromethorphan, as needed. Please note that some cough medications are not recommended if you suffer from hypertension.    Saline mist spray is helpful for removing excess mucus from your nose.  Room humidifiers are helpful to ease breathing at night. I recommend guaifenesin (Mucinex) with plenty of water throughout the day to help thin and loosen mucus secretions in your respiratory passages.   If appropriate based upon your other medical problems, you might also find relief of nasal/sinus congestion symptoms by using a nasal decongestant such as fluticasone (Flonase ) or pseudoephedrine (Sudafed sinus).  You will need to obtain Sudafed from behind the pharmacist counter.  Speak to the pharmacist to verify that you are not duplicating medications with other over-the-counter formulations that you may be using.

## 2022-10-23 NOTE — ED Provider Notes (Addendum)
Helen Jones    CSN: [id number] Arrival date & time: 10/23/22  1257      History   Chief Complaint Chief Complaint  Patient presents with   Nasal Congestion   Headache    HPI CASHE GATT is a 57 y.o. female.    Headache   Patient presents to urgent care for nasal congestion, headache, chest congestion x 2 days.  Endorses fever with last measured fever yesterday.  She is taking Sudafed for her symptoms.  She endorses multiple family members with history of sinus infections.  PMH including COPD.  Is productive of green thick sputum.  Nasal congestion is productive of yellow-green thick mucus.  Cough is worse at night and in the morning.  Past Medical History:  Diagnosis Date   Anxiety    COPD (chronic obstructive pulmonary disease) ([place])    Depression    Eczema    GERD (gastroesophageal reflux disease)    Hypothyroid     Patient Active Problem List   Diagnosis Date Noted   Low back pain 09/25/2022   Arm pain, left 03/13/2021   Psoriasis 11/25/2020   Dark stools 04/26/2020   Hyperlipemia 03/25/2020   Chronic obstructive pulmonary disease ([place]) 03/25/2020   Encounter for screening for lung cancer 07/07/2019   Hypernatremia 06/17/2017   Fatigue 05/31/2017   Psoriasis of scalp 05/31/2017   Special screening for malignant neoplasms, colon    Benign neoplasm of ascending colon    Benign neoplasm of transverse colon    Benign neoplasm of sigmoid colon    Allergic rhinitis 12/25/2013   Overweight 04/20/2011   Eczema 04/20/2011   VAGINITIS, ATROPHIC, POSTMENOPAUSAL 08/16/2010   OSTEOARTHRITIS, SACROILIAC JOINT 10/20/2007   MIGRAINE NOS W/O INTRACTABLE MIGRAINE 05/12/2007   Anxiety state 01/13/2007   GERD 01/13/2007   Hypothyroidism 12/05/2006   Major depressive disorder, recurrent episode ([place]) 12/05/2006   Tobacco abuse 12/05/2006    Past Surgical History:  Procedure Laterality Date   CHOLECYSTECTOMY     COLONOSCOPY WITH PROPOFOL N/A  09/27/2015   Procedure: COLONOSCOPY WITH PROPOFOL;  Surgeon: [name], MD;  Location: [facility] ENDOSCOPY;  Service: Endoscopy;  Laterality: N/A;   TUBAL LIGATION      OB History   No obstetric history on file.      Home Medications    Prior to Admission medications   Medication Sig Start Date End Date Taking? Authorizing Provider  albuterol (VENTOLIN HFA) 108 (90 Base) MCG/ACT inhaler INHALE 2 PUFFS INTO THE LUNGS EVERY 4 (FOUR) HOURS AS NEEDED FOR WHEEZING OR SHORTNESS OF BREATH. 04/13/22   [name], [name], DO  baclofen (LIORESAL) 10 MG tablet Take 1 tablet (10 mg total) by mouth 3 (three) times daily. 09/25/22   [name], [name], DO  buPROPion (WELLBUTRIN) 75 MG tablet TAKE 1 TABLET BY MOUTH TWICE A DAY Patient not taking: Reported on 04/13/2022 11/22/20   [name], DO  Calcium Carbonate-Vitamin D3 (CALCIUM 600/VITAMIN D) 600-400 MG-UNIT TABS Take 2 tablets by mouth daily with meal. 06/05/17   [name], MD  cetirizine (ZYRTEC ALLERGY) 10 MG tablet Take 1 tablet (10 mg total) by mouth daily. 04/26/22   [name], NP  escitalopram (LEXAPRO) 10 MG tablet Take 1 tablet (10 mg total) by mouth daily. Patient not taking: Reported on 04/13/2022 07/06/19   [name], MD  famotidine (PEPCID) 20 MG tablet Take 1 tablet (20 mg total) by mouth daily. 11/25/20   [name], DO  ipratropium (ATROVENT) 0.06 %  nasal spray Place 2 sprays into both nostrils 4 (four) times daily as needed for rhinitis. 04/13/22   [name], [name], DO  levothyroxine (SYNTHROID) 100 MCG tablet TAKE 1 TABLET BY MOUTH EVERY DAY BEFORE BREAKFAST 04/13/22   [name], [name], DO  nicotine (NICODERM CQ - DOSED IN MG/24 HOURS) 14 mg/24hr patch Place 1 patch (14 mg total) onto the skin daily. 07/06/19   [name], MD  nicotine polacrilex (NICORETTE) 2 MG gum Take 1 each (2 mg total) by mouth as needed for smoking cessation. 07/06/19   [name], MD  Olopatadine HCl (PATADAY) 0.2 % SOLN Apply 1 drop to eye daily. 05/31/17    [name], MD  omeprazole (PRILOSEC) 20 MG capsule TAKE 1 CAPSULE BY MOUTH EVERY DAY 07/10/22   [name], [name], DO  pravastatin (PRAVACHOL) 40 MG tablet Take 1 tablet (40 mg total) by mouth daily. Please schedule office visit before next refill. 04/13/22   [name], [name], DO  predniSONE (STERAPRED UNI-PAK 21 TAB) 10 MG (21) TBPK tablet Take by mouth daily. As directed 04/26/22   [name], NP  vitamin B-12 (CYANOCOBALAMIN) 1000 MCG tablet Take 1,000 mcg by mouth daily.    [provider]    Family History Family History  Problem Relation Age of Onset   Stomach cancer Other    Colitis Other    Diabetes Other     Social History Social History   Tobacco Use   Smoking status: Every Day    Packs/day: 0.50    Types: Cigarettes    Start date: 10/08/1977    Passive exposure: Current   Smokeless tobacco: Never   Tobacco comments:    Previous 2 ppd smoker.    Vaping Use   Vaping Use: Never used  Substance Use Topics   Alcohol use: No    Alcohol/week: 0.0 standard drinks of alcohol   Drug use: No     Allergies   Patient has no known allergies.   Review of Systems Review of Systems  Neurological:  Positive for headaches.     Physical Exam Triage Vital Signs ED Triage Vitals  Enc Vitals Group     BP 10/23/22 1306 127/78     Pulse Rate 10/23/22 1306 77     Resp 10/23/22 1306 16     Temp 10/23/22 1306 98.5 F (36.9 C)     Temp Source 10/23/22 1306 Temporal     SpO2 10/23/22 1306 97 %     Weight --      Height --      Head Circumference --      Peak Flow --      Pain Score 10/23/22 1305 0     Pain Loc --      Pain Edu? --      Excl. in [place]? --    No data found.  Updated Vital Signs BP 127/78 (BP Location: Left Arm)   Pulse 77   Temp 98.5 F (36.9 C) (Temporal)   Resp 16   SpO2 97%   Visual Acuity Right Eye Distance:   Left Eye Distance:   Bilateral Distance:    Right Eye Near:   Left Eye Near:    Bilateral Near:     Physical Exam Vitals  reviewed.  Constitutional:      Appearance: She is well-developed. She is ill-appearing.  HENT:     Right Ear: Tympanic membrane normal.     Left Ear: Tympanic membrane normal.  Nose:     Right Sinus: Maxillary sinus tenderness present.     Left Sinus: Maxillary sinus tenderness present.  Pulmonary:     Effort: Pulmonary effort is normal.     Breath sounds: Normal breath sounds. No wheezing or rhonchi.  Skin:    General: Skin is warm and dry.  Neurological:     Mental Status: She is alert and oriented to person, place, and time.  Psychiatric:        Mood and Affect: Mood normal.        Behavior: Behavior normal.      UC Treatments / Results  Labs (all labs ordered are listed, but only abnormal results are displayed) Labs Reviewed - No data to display  EKG   Radiology No results found.  Procedures Procedures (including critical care time)  Medications Ordered in UC Medications - No data to display  Initial Impression / Assessment and Plan / UC Course  I have reviewed the triage vital signs and the nursing notes.  Pertinent labs & imaging results that were available during my care of the patient were reviewed by me and considered in my medical decision making (see chart for details).   Patient is afebrile here without recent antipyretics. Satting well on room air. Overall is ill appearing, well hydrated, without respiratory distress. Pulmonary exam is unremarkable.  Lungs CTAB without wheezing, rhonchi, rales.  No pharyngeal erythema or peritonsillar exudate.  Patient's symptoms are consistent with an acute viral process resulting in sinusitis.  Given her history of COPD and risk for poor outcome, will provide antibacterial therapy using azithromycin.  In addition, will provide corticosteroid therapy to reduce sinus inflammation and encourage drainage.  Otherwise recommending OTC medication for symptom control.  Final Clinical Impressions(s) / UC Diagnoses   Final  diagnoses:  None   Discharge Instructions   None    ED Prescriptions   None    PDMP not reviewed this encounter.   Rose Phi, [place] 10/23/22 [place], [place], [place] 10/23/22 1329

## 2022-10-29 ENCOUNTER — Encounter: Payer: Self-pay | Admitting: Gastroenterology

## 2022-10-29 MED ORDER — NA SULFATE-K SULFATE-MG SULF 17.5-3.13-1.6 GM/177ML PO SOLN: 1.0000 | Freq: Once | ORAL | 0 refills | Status: AC

## 2022-10-29 NOTE — Addendum Note (Signed)
Addended by: Lurlean Nanny on: 10/29/2022 10:04 AM   Modules accepted: Orders

## 2022-10-30 ENCOUNTER — Ambulatory Visit: Payer: Commercial Managed Care - HMO | Admitting: Certified Registered"

## 2022-10-30 ENCOUNTER — Ambulatory Visit
Admission: RE | Admit: 2022-10-30 | Discharge: 2022-10-30 | Disposition: A | Discharge status: Home / Self Care | Payer: Commercial Managed Care - HMO | Attending: Gastroenterology | Admitting: Gastroenterology

## 2022-10-30 ENCOUNTER — Encounter
Admission: RE | Disposition: A | Discharge status: Home / Self Care | Payer: Self-pay | Source: Home / Self Care | Attending: Gastroenterology

## 2022-10-30 ENCOUNTER — Encounter: Payer: Self-pay | Admitting: Gastroenterology

## 2022-10-30 ENCOUNTER — Other Ambulatory Visit: Payer: Self-pay

## 2022-10-30 DIAGNOSIS — K449 Diaphragmatic hernia without obstruction or gangrene: Secondary | ICD-10-CM | POA: Diagnosis not present

## 2022-10-30 DIAGNOSIS — K2270 Barrett's esophagus without dysplasia: Secondary | ICD-10-CM | POA: Diagnosis not present

## 2022-10-30 DIAGNOSIS — D124 Benign neoplasm of descending colon: Secondary | ICD-10-CM | POA: Diagnosis not present

## 2022-10-30 DIAGNOSIS — J449 Chronic obstructive pulmonary disease, unspecified: Secondary | ICD-10-CM | POA: Diagnosis not present

## 2022-10-30 DIAGNOSIS — Z860100 Personal history of colon polyps, unspecified: Secondary | ICD-10-CM

## 2022-10-30 DIAGNOSIS — F17210 Nicotine dependence, cigarettes, uncomplicated: Secondary | ICD-10-CM | POA: Insufficient documentation

## 2022-10-30 DIAGNOSIS — D122 Benign neoplasm of ascending colon: Secondary | ICD-10-CM | POA: Diagnosis not present

## 2022-10-30 DIAGNOSIS — F32A Depression, unspecified: Secondary | ICD-10-CM | POA: Insufficient documentation

## 2022-10-30 DIAGNOSIS — K635 Polyp of colon: Secondary | ICD-10-CM | POA: Diagnosis not present

## 2022-10-30 DIAGNOSIS — Z1211 Encounter for screening for malignant neoplasm of colon: Secondary | ICD-10-CM | POA: Insufficient documentation

## 2022-10-30 DIAGNOSIS — K219 Gastro-esophageal reflux disease without esophagitis: Secondary | ICD-10-CM | POA: Insufficient documentation

## 2022-10-30 DIAGNOSIS — E039 Hypothyroidism, unspecified: Secondary | ICD-10-CM | POA: Insufficient documentation

## 2022-10-30 DIAGNOSIS — F419 Anxiety disorder, unspecified: Secondary | ICD-10-CM | POA: Insufficient documentation

## 2022-10-30 DIAGNOSIS — K641 Second degree hemorrhoids: Secondary | ICD-10-CM | POA: Insufficient documentation

## 2022-10-30 DIAGNOSIS — R519 Headache, unspecified: Secondary | ICD-10-CM | POA: Diagnosis not present

## 2022-10-30 HISTORY — PX: COLONOSCOPY WITH PROPOFOL: SHX5780

## 2022-10-30 HISTORY — PX: ESOPHAGOGASTRODUODENOSCOPY: SHX5428

## 2022-10-30 SURGERY — COLONOSCOPY WITH PROPOFOL
Anesthesia: General

## 2022-10-30 MED ORDER — SODIUM CHLORIDE 0.9 % IV SOLN: INTRAVENOUS | Status: DC

## 2022-10-30 MED ORDER — PROPOFOL 10 MG/ML IV BOLUS
INTRAVENOUS | Status: DC | PRN
  Administered 2022-10-30: 20 mg via INTRAVENOUS
  Administered 2022-10-30: 80 mg via INTRAVENOUS
  Administered 2022-10-30: 10 mg via INTRAVENOUS

## 2022-10-30 MED ORDER — DEXMEDETOMIDINE HCL IN NACL 80 MCG/20ML IV SOLN
INTRAVENOUS | Status: DC | PRN
  Administered 2022-10-30: 12 ug via BUCCAL

## 2022-10-30 MED ORDER — LIDOCAINE HCL (CARDIAC) PF 100 MG/5ML IV SOSY
PREFILLED_SYRINGE | INTRAVENOUS | Status: DC | PRN
  Administered 2022-10-30: 100 mg via INTRAVENOUS

## 2022-10-30 MED ORDER — PHENYLEPHRINE 80 MCG/ML (10ML) SYRINGE FOR IV PUSH (FOR BLOOD PRESSURE SUPPORT)
PREFILLED_SYRINGE | INTRAVENOUS | Status: DC | PRN
  Administered 2022-10-30: 80 ug via INTRAVENOUS
  Administered 2022-10-30: 40 ug via INTRAVENOUS

## 2022-10-30 MED ORDER — PROPOFOL 500 MG/50ML IV EMUL
INTRAVENOUS | Status: DC | PRN
  Administered 2022-10-30: 150 ug/kg/min via INTRAVENOUS

## 2022-10-30 NOTE — Op Note (Signed)
[facility] Gastroenterology Patient Name: Helen Jones Procedure Date: 10/30/2022 9:58 AM MRN: [id number] Account #: [id number] Date of Birth: [birth year] Admit Type: Outpatient Age: 57 Room: [facility] ENDO ROOM 4 Gender: Female Note Status: Finalized Instrument Name: Michaelle Birks [id number] Procedure:             Upper GI endoscopy Indications:           Heartburn Providers:             [name] MD, MD Referring MD:          [name] (Referring MD) Medicines:             Propofol per Anesthesia Complications:         No immediate complications. Procedure:             Pre-Anesthesia Assessment:                        - Prior to the procedure, a History and Physical was                         performed, and patient medications and allergies were                         reviewed. The patient's tolerance of previous                         anesthesia was also reviewed. The risks and benefits                         of the procedure and the sedation options and risks                         were discussed with the patient. All questions were                         answered, and informed consent was obtained. Prior                         Anticoagulants: The patient has taken no anticoagulant                         or antiplatelet agents. ASA Grade Assessment: II - A                         patient with mild systemic disease. After reviewing                         the risks and benefits, the patient was deemed in                         satisfactory condition to undergo the procedure.                        After obtaining informed consent, the endoscope was                         passed under direct vision. Throughout the procedure,  the patient's blood pressure, pulse, and oxygen                         saturations were monitored continuously. The Endoscope                         was introduced through the mouth, and advanced to the                          second part of duodenum. The upper GI endoscopy was                         accomplished without difficulty. The patient tolerated                         the procedure well. Findings:      A medium-sized hiatal hernia was present.      There were esophageal mucosal changes suspicious for long-segment       Barrett's esophagus present in the lower third of the esophagus. The       maximum longitudinal extent of these mucosal changes was 4 cm in length.       This was biopsied with a cold forceps for histology.      The stomach was normal.      The examined duodenum was normal. Impression:            - Medium-sized hiatal hernia.                        - Esophageal mucosal changes suspicious for                         long-segment Barrett's esophagus. Biopsied.                        - Normal stomach.                        - Normal examined duodenum. Recommendation:        - Discharge patient to home.                        - Resume previous diet.                        - Continue present medications.                        - Await pathology results.                        - Perform a colonoscopy today. Procedure Code(s):     --- Professional ---                        [phone], Esophagogastroduodenoscopy, flexible,                         transoral; with biopsy, single or multiple Diagnosis Code(s):     --- Professional ---  R12, Heartburn                        K22.89, Other specified disease of esophagus CPT copyright 2022 American Medical Association. All rights reserved. The codes documented in this report are preliminary and upon coder review may  be revised to meet current compliance requirements. [name] MD, MD 10/30/2022 10:11:15 AM This report has been signed electronically. Number of Addenda: 0 Note Initiated On: 10/30/2022 9:58 AM Estimated Blood Loss:  Estimated blood loss: none.      [facility]

## 2022-10-30 NOTE — Anesthesia Procedure Notes (Signed)
Procedure Name: MAC Date/Time: 10/30/2022 10:04 AM  Performed by: Biagio Borg, CRNAPre-anesthesia Checklist: Patient identified, Emergency Drugs available, Suction available, Patient being monitored and Timeout performed Patient Re-evaluated:Patient Re-evaluated prior to induction Oxygen Delivery Method: Nasal cannula Induction Type: IV induction Placement Confirmation: positive ETCO2 and CO2 detector

## 2022-10-30 NOTE — Anesthesia Postprocedure Evaluation (Signed)
Anesthesia Post Note  Patient: Helen Jones  Procedure(s) Performed: COLONOSCOPY WITH PROPOFOL ESOPHAGOGASTRODUODENOSCOPY (EGD)  Patient location during evaluation: Endoscopy Anesthesia Type: General Level of consciousness: awake and alert Pain management: pain level controlled Vital Signs Assessment: post-procedure vital signs reviewed and stable Respiratory status: spontaneous breathing, nonlabored ventilation, respiratory function stable and patient connected to nasal cannula oxygen Cardiovascular status: blood pressure returned to baseline and stable Postop Assessment: no apparent nausea or vomiting Anesthetic complications: no   No notable events documented.   Last Vitals:  Vitals:   10/30/22 0904 10/30/22 1034  BP: 109/77 95/61  Pulse: 77   Resp: 18   Temp: (!) 36.4 C (!) 36 C  SpO2: 100%     Last Pain:  Vitals:   10/30/22 1054  TempSrc:   PainSc: 0-No pain                 Arita Miss

## 2022-10-30 NOTE — Op Note (Signed)
[facility] Gastroenterology Patient Name: Helen Jones Procedure Date: 10/30/2022 9:57 AM MRN: [id number] Account #: [id number] Date of Birth: 1965/12/20 Admit Type: Outpatient Age: 57 Room: [facility] ENDO ROOM 4 Gender: Female Note Status: Finalized Instrument Name: Jasper Riling [id number] Procedure:             Colonoscopy Indications:           High risk colon cancer surveillance: Personal history                         of colonic polyps Providers:             [name] MD, MD Referring MD:          [name] (Referring MD) Medicines:             Propofol per Anesthesia Complications:         No immediate complications. Procedure:             Pre-Anesthesia Assessment:                        - Prior to the procedure, a History and Physical was                         performed, and patient medications and allergies were                         reviewed. The patient's tolerance of previous                         anesthesia was also reviewed. The risks and benefits                         of the procedure and the sedation options and risks                         were discussed with the patient. All questions were                         answered, and informed consent was obtained. Prior                         Anticoagulants: The patient has taken no anticoagulant                         or antiplatelet agents. ASA Grade Assessment: II - A                         patient with mild systemic disease. After reviewing                         the risks and benefits, the patient was deemed in                         satisfactory condition to undergo the procedure.                        After obtaining informed consent, the colonoscope was  passed under direct vision. Throughout the procedure,                         the patient's blood pressure, pulse, and oxygen                         saturations were monitored continuously. The                          Colonoscope was introduced through the anus and                         advanced to the the cecum, identified by appendiceal                         orifice and ileocecal valve. The colonoscopy was                         performed without difficulty. The patient tolerated                         the procedure well. The quality of the bowel                         preparation was excellent. Findings:      The perianal and digital rectal examinations were normal.      Three sessile polyps were found in the descending colon. The polyps were       3 to 6 mm in size. These polyps were removed with a cold snare.       Resection and retrieval were complete.      Three sessile polyps were found in the ascending colon. The polyps were       3 to 5 mm in size. These polyps were removed with a cold snare.       Resection and retrieval were complete.      Non-bleeding internal hemorrhoids were found during retroflexion. The       hemorrhoids were Grade II (internal hemorrhoids that prolapse but reduce       spontaneously). Impression:            - Three 3 to 6 mm polyps in the descending colon,                         removed with a cold snare. Resected and retrieved.                        - Three 3 to 5 mm polyps in the ascending colon,                         removed with a cold snare. Resected and retrieved.                        - Non-bleeding internal hemorrhoids. Recommendation:        - Discharge patient to home.                        - Resume previous diet.                        -  Continue present medications.                        - Await pathology results.                        - Repeat colonoscopy in 3 years for surveillance. Procedure Code(s):     --- Professional ---                        [id number], Colonoscopy, flexible; with removal of                         tumor(s), polyp(s), or other lesion(s) by snare                         technique Diagnosis Code(s):     ---  Professional ---                        Z86.010, Personal history of colonic polyps                        D12.4, Benign neoplasm of descending colon CPT copyright 2022 American Medical Association. All rights reserved. The codes documented in this report are preliminary and upon coder review may  be revised to meet current compliance requirements. [name] MD, MD 10/30/2022 10:32:06 AM This report has been signed electronically. Number of Addenda: 0 Note Initiated On: 10/30/2022 9:57 AM Scope Withdrawal Time: 0 hours 12 minutes 18 seconds  Total Procedure Duration: 0 hours 17 minutes 15 seconds  Estimated Blood Loss:  Estimated blood loss: none.      [facility]

## 2022-10-30 NOTE — H&P (Signed)
Helen Lame, MD [address] [place], [place] [address] Phone:[phone] Fax : [phone]  Primary Care Physician:  [name], DO Primary Gastroenterologist:  Dr. Allen Norris  Pre-Procedure History & Physical: HPI:  Helen Jones is a 57 y.o. female is here for an endoscopy and colonoscopy.   Past Medical History:  Diagnosis Date   Anxiety    COPD (chronic obstructive pulmonary disease) ([facility])    Depression    Eczema    GERD (gastroesophageal reflux disease)    Hypothyroid     Past Surgical History:  Procedure Laterality Date   CHOLECYSTECTOMY     COLONOSCOPY WITH PROPOFOL N/A 09/27/2015   Procedure: COLONOSCOPY WITH PROPOFOL;  Surgeon: Helen Lame, MD;  Location: [facility] ENDOSCOPY;  Service: Endoscopy;  Laterality: N/A;   TUBAL LIGATION      Prior to Admission medications   Medication Sig Start Date End Date Taking? Authorizing Provider  albuterol (VENTOLIN HFA) 108 (90 Base) MCG/ACT inhaler INHALE 2 PUFFS INTO THE LUNGS EVERY 4 (FOUR) HOURS AS NEEDED FOR WHEEZING OR SHORTNESS OF BREATH. 04/13/22  Yes [name], [name], DO  Calcium Carbonate-Vitamin D3 (CALCIUM 600/VITAMIN D) 600-400 MG-UNIT TABS Take 2 tablets by mouth daily with meal. 06/05/17  Yes Gonfa, Taye T, MD  cetirizine (ZYRTEC ALLERGY) 10 MG tablet Take 1 tablet (10 mg total) by mouth daily. 04/26/22  Yes [name], NP  ipratropium (ATROVENT) 0.06 % nasal spray Place 2 sprays into both nostrils 4 (four) times daily as needed for rhinitis. 04/13/22  Yes [name], [name], DO  levothyroxine (SYNTHROID) 100 MCG tablet TAKE 1 TABLET BY MOUTH EVERY DAY BEFORE BREAKFAST 04/13/22  Yes [name], [name], DO  Olopatadine HCl (PATADAY) 0.2 % SOLN Apply 1 drop to eye daily. 05/31/17  Yes [name], MD  omeprazole (PRILOSEC) 20 MG capsule TAKE 1 CAPSULE BY MOUTH EVERY DAY 07/10/22  Yes [name], [name], DO  pravastatin (PRAVACHOL) 40 MG tablet Take 1 tablet (40 mg total) by mouth daily. Please schedule office visit before next  refill. 04/13/22  Yes [name], [name], DO  vitamin B-12 (CYANOCOBALAMIN) 1000 MCG tablet Take 1,000 mcg by mouth daily.   Yes [provider]  azithromycin (ZITHROMAX Z-PAK) 250 MG tablet Take 2 tablets (500 mg) today, then 1 tablet (250 mg) for next 4 days. 10/23/22   Immordino, Annie Main, FNP  baclofen (LIORESAL) 10 MG tablet Take 1 tablet (10 mg total) by mouth 3 (three) times daily. 09/25/22   [name], [name], DO  buPROPion (WELLBUTRIN) 75 MG tablet TAKE 1 TABLET BY MOUTH TWICE A DAY 11/22/20   [name], [name], DO  escitalopram (LEXAPRO) 10 MG tablet Take 1 tablet (10 mg total) by mouth daily. 07/06/19   [name], MD  famotidine (PEPCID) 20 MG tablet Take 1 tablet (20 mg total) by mouth daily. 11/25/20   Daisy Floro, DO  nicotine (NICODERM CQ - DOSED IN MG/24 HOURS) 14 mg/24hr patch Place 1 patch (14 mg total) onto the skin daily. 07/06/19   [name], MD  nicotine polacrilex (NICORETTE) 2 MG gum Take 1 each (2 mg total) by mouth as needed for smoking cessation. 07/06/19   [name], MD    Allergies as of 10/04/2022   (No Known Allergies)    Family History  Problem Relation Age of Onset   Stomach cancer Other    Colitis Other    Diabetes Other     Social History   Socioeconomic History   Marital status: Divorced  Spouse name: Not on file   Number of children: Not on file   Years of education: Not on file   Highest education level: Not on file  Occupational History   Not on file  Tobacco Use   Smoking status: Every Day    Packs/day: 0.50    Types: Cigarettes    Start date: 10/08/1977    Passive exposure: Current   Smokeless tobacco: Never   Tobacco comments:    Previous 2 ppd smoker.    Vaping Use   Vaping Use: Never used  Substance and Sexual Activity   Alcohol use: No    Alcohol/week: 0.0 standard drinks of alcohol   Drug use: No   Sexual activity: Yes  Other Topics Concern   Not on file  Social History Narrative   Daughter Cloyd Stagers  (DOB: 2/07)  here at [facility].  Daughter Ryana Montecalvo in custody of the daughter's father.  Complicated history.  Sister living locally.  She lives in [place].  + Tobacco.  One sexual partner for past 5 years- stable.   Social Determinants of Health   Financial Resource Strain: Not on file  Food Insecurity: Not on file  Transportation Needs: Not on file  Physical Activity: Not on file  Stress: Not on file  Social Connections: Not on file  Intimate Partner Violence: Not on file    Review of Systems: See HPI, otherwise negative ROS  Physical Exam: BP 109/77   Pulse 77   Temp (!) 97.5 F (36.4 C) (Temporal)   Resp 18   Ht '5\' 1"'$  (1.549 m)   Wt 59 kg   SpO2 100%   BMI 24.56 kg/m  General:   Alert,  pleasant and cooperative in NAD Head:  Normocephalic and atraumatic. Neck:  Supple; no masses or thyromegaly. Lungs:  Clear throughout to auscultation.    Heart:  Regular rate and rhythm. Abdomen:  Soft, nontender and nondistended. Normal bowel sounds, without guarding, and without rebound.   Neurologic:  Alert and  oriented x4;  grossly normal neurologically.  Impression/Plan: Helen Jones is here for an endoscopy and colonoscopy to be performed for GERD and a history of adenomatous polyps on 2016   Risks, benefits, limitations, and alternatives regarding  endoscopy and colonoscopy have been reviewed with the patient.  Questions have been answered.  All parties agreeable.   Helen Lame, MD  10/30/2022, 9:20 AM

## 2022-10-30 NOTE — Transfer of Care (Signed)
Immediate Anesthesia Transfer of Care Note  Patient: Helen Jones  Procedure(s) Performed: COLONOSCOPY WITH PROPOFOL ESOPHAGOGASTRODUODENOSCOPY (EGD)  Patient Location: PACU  Anesthesia Type:General  Level of Consciousness: drowsy  Airway & Oxygen Therapy: Patient Spontanous Breathing  Post-op Assessment: Report given to RN and Post -op Vital signs reviewed and stable  Post vital signs: Reviewed and stable  Last Vitals:  Vitals Value Taken Time  BP 95/61 10/30/22 1035  Temp 36 C 10/30/22 1034  Pulse 65 10/30/22 1035  Resp 23 10/30/22 1035  SpO2 98 % 10/30/22 1035  Vitals shown include unvalidated device data.  Last Pain:  Vitals:   10/30/22 1034  TempSrc: Temporal  PainSc: Asleep         Complications: No notable events documented.

## 2022-10-30 NOTE — Anesthesia Preprocedure Evaluation (Signed)
Anesthesia Evaluation  Patient identified by MRN, date of birth, ID band Patient awake    Reviewed: Allergy & Precautions, NPO status , Patient's Chart, lab work & pertinent test results  History of Anesthesia Complications Negative for: history of anesthetic complications  Airway Mallampati: II  TM Distance: >3 FB Neck ROM: Full    Dental  (+) Edentulous Upper, Partial Lower, Poor Dentition   Pulmonary neg sleep apnea, COPD,  COPD inhaler, Current SmokerPatient did not abstain from smoking.   Pulmonary exam normal breath sounds clear to auscultation       Cardiovascular Exercise Tolerance: Good METS(-) hypertension(-) CAD and (-) Past MI negative cardio ROS (-) dysrhythmias  Rhythm:Regular Rate:Normal - Systolic murmurs    Neuro/Psych  Headaches PSYCHIATRIC DISORDERS Anxiety Depression       GI/Hepatic ,GERD  ,,(+)     (-) substance abuse    Endo/Other  neg diabetesHypothyroidism    Renal/GU negative Renal ROS     Musculoskeletal   Abdominal   Peds  Hematology   Anesthesia Other Findings Past Medical History: No date: Anxiety No date: COPD (chronic obstructive pulmonary disease) (HCC) No date: Depression No date: Eczema No date: GERD (gastroesophageal reflux disease) No date: Hypothyroid  Reproductive/Obstetrics                              Anesthesia Physical Anesthesia Plan  ASA: 3  Anesthesia Plan: General   Post-op Pain Management: Minimal or no pain anticipated   Induction: Intravenous  PONV Risk Score and Plan: 2 and Propofol infusion, TIVA and Ondansetron  Airway Management Planned: Nasal Cannula  Additional Equipment: None  Intra-op Plan:   Post-operative Plan:   Informed Consent: I have reviewed the patients History and Physical, chart, labs and discussed the procedure including the risks, benefits and alternatives for the proposed anesthesia with the  patient or authorized representative who has indicated his/her understanding and acceptance.     Dental advisory given  Plan Discussed with: CRNA and Surgeon  Anesthesia Plan Comments: (Discussed risks of anesthesia with patient, including possibility of difficulty with spontaneous ventilation under anesthesia necessitating airway intervention, PONV, and rare risks such as cardiac or respiratory or neurological events, and allergic reactions. Discussed the role of CRNA in patient's perioperative care. Patient understands. Patient counseled on benefits of smoking cessation, and increased perioperative risks associated with continued smoking. )         Anesthesia Quick Evaluation

## 2022-10-31 ENCOUNTER — Encounter: Payer: Self-pay | Admitting: Gastroenterology

## 2022-10-31 LAB — SURGICAL PATHOLOGY

## 2022-11-01 ENCOUNTER — Encounter: Payer: Self-pay | Admitting: Gastroenterology

## 2022-11-24 ENCOUNTER — Other Ambulatory Visit: Payer: Self-pay | Admitting: Family Medicine

## 2022-11-24 DIAGNOSIS — J449 Chronic obstructive pulmonary disease, unspecified: Secondary | ICD-10-CM

## 2023-01-25 ENCOUNTER — Other Ambulatory Visit: Payer: Self-pay | Admitting: Family Medicine

## 2023-02-14 ENCOUNTER — Other Ambulatory Visit: Payer: Self-pay | Admitting: Family Medicine

## 2023-02-14 DIAGNOSIS — E039 Hypothyroidism, unspecified: Secondary | ICD-10-CM

## 2023-03-28 ENCOUNTER — Other Ambulatory Visit: Payer: Self-pay | Admitting: Family Medicine

## 2023-03-28 MED ORDER — IPRATROPIUM BROMIDE 0.06 % NA SOLN: 2.0000 | Freq: Four times a day (QID) | NASAL | 0 refills | Status: DC | PRN

## 2023-05-07 ENCOUNTER — Other Ambulatory Visit: Payer: Self-pay | Admitting: Family Medicine

## 2023-05-07 DIAGNOSIS — Z1231 Encounter for screening mammogram for malignant neoplasm of breast: Secondary | ICD-10-CM

## 2023-07-04 ENCOUNTER — Ambulatory Visit: Payer: Medicaid Other | Admitting: Gastroenterology

## 2023-07-08 ENCOUNTER — Other Ambulatory Visit: Payer: Self-pay | Admitting: Family Medicine

## 2023-07-08 ENCOUNTER — Other Ambulatory Visit: Payer: Self-pay

## 2023-07-08 DIAGNOSIS — E785 Hyperlipidemia, unspecified: Secondary | ICD-10-CM

## 2023-07-08 MED ORDER — PRAVASTATIN SODIUM 40 MG PO TABS: 40.0000 mg | ORAL_TABLET | Freq: Every day | ORAL | 0 refills | Status: DC

## 2023-07-09 ENCOUNTER — Telehealth: Payer: Self-pay | Admitting: Gastroenterology

## 2023-07-09 NOTE — Telephone Encounter (Signed)
Patient called to reschedule her appointment with Dr. Servando Snare.

## 2023-08-20 ENCOUNTER — Ambulatory Visit: Payer: Medicaid Other | Admitting: Gastroenterology

## 2023-08-20 ENCOUNTER — Encounter: Payer: Self-pay | Admitting: Gastroenterology

## 2023-08-20 VITALS — BP 112/75 | HR 73 | Temp 98.1°F | Ht 61.0 in | Wt 128.0 lb

## 2023-08-20 DIAGNOSIS — K449 Diaphragmatic hernia without obstruction or gangrene: Secondary | ICD-10-CM

## 2023-08-20 DIAGNOSIS — K219 Gastro-esophageal reflux disease without esophagitis: Secondary | ICD-10-CM

## 2023-08-20 DIAGNOSIS — R634 Abnormal weight loss: Secondary | ICD-10-CM

## 2023-08-20 DIAGNOSIS — R1310 Dysphagia, unspecified: Secondary | ICD-10-CM

## 2023-08-20 MED ORDER — ESOMEPRAZOLE MAGNESIUM 40 MG PO CPDR: 40.0000 mg | DELAYED_RELEASE_CAPSULE | Freq: Every day | ORAL | 3 refills | Status: DC

## 2023-08-20 NOTE — Patient Instructions (Addendum)
Your swallow test has been scheduled for November 19th at [facility] entrance. You must arrive at 10:00 AM to register.  You can not have anything to eat or drink after 12 midnight the day before.  If you need to cancel or reschedule, please call [phone].

## 2023-08-20 NOTE — Progress Notes (Signed)
Primary Care Physician: Lorayne Bender, MD  Primary Gastroenterologist:  Dr. Midge Minium  Chief Complaint  Patient presents with   New Patient (Initial Visit)    HPI: Helen Jones is a 57 y.o. female here with a history of having an EGD and colonoscopy by me at the beginning of this year.  The patient had pathology sent off that showed:  DIAGNOSIS: A. GASTROESOPHAGEAL JUNCTION; COLD BIOPSY: - SQUAMOCOLUMNAR MUCOSA WITH INTESTINAL METAPLASIA. - NEGATIVE FOR DYSPLASIA AND MALIGNANCY.  B. COLON POLYPS X 2, ASCENDING; COLD SNARE: - MULTIPLE FRAGMENTS OF TUBULAR ADENOMAS. - NEGATIVE FOR HIGH-GRADE DYSPLASIA AND MALIGNANCY.  C. COLON POLYPS X 3, DESCENDING; COLD SNARE: - FRAGMENTS (X 5) OF TUBULAR ADENOMAS. - NEGATIVE FOR HIGH-GRADE DYSPLASIA AND MALIGNANCY   The endoscopy was consistent with long segment Barrett's esophagus. The patient now comes in today with questions about her hiatal hernia.  The patient states that she has been having bread having a trouble passing through her esophagus.  She is wondering if her hiatal hernia is causing her dysphagia.  The patient states that this has been going on for some time but now she reports that she feels like she is losing weight.  Past Medical History:  Diagnosis Date   Anxiety    COPD (chronic obstructive pulmonary disease) (HCC)    Depression    Eczema    GERD (gastroesophageal reflux disease)    Hypothyroid     Current Outpatient Medications  Medication Sig Dispense Refill   albuterol (VENTOLIN HFA) 108 (90 Base) MCG/ACT inhaler INHALE 2 PUFFS INTO THE LUNGS EVERY 4 HOURS AS NEEDED FOR WHEEZING OR SHORTNESS OF BREATH. 18 each 3   Calcium Carbonate-Vitamin D3 (CALCIUM 600/VITAMIN D) 600-400 MG-UNIT TABS Take 2 tablets by mouth daily with meal. 180 tablet 3   escitalopram (LEXAPRO) 10 MG tablet Take 1 tablet (10 mg total) by mouth daily. 90 tablet 3   fexofenadine (ALLEGRA) 180 MG tablet Take 180 mg by mouth daily.      ipratropium (ATROVENT) 0.06 % nasal spray Place 2 sprays into both nostrils 4 (four) times daily as needed for rhinitis. 15 mL 0   levothyroxine (SYNTHROID) 100 MCG tablet TAKE 1 TABLET BY MOUTH EVERY DAY BEFORE BREAKFAST 90 tablet 1   omeprazole (PRILOSEC) 20 MG capsule TAKE 1 CAPSULE BY MOUTH EVERY DAY 90 capsule 2   pravastatin (PRAVACHOL) 40 MG tablet Take 1 tablet (40 mg total) by mouth daily. Please schedule office visit before next refill. 90 tablet 0   vitamin B-12 (CYANOCOBALAMIN) 1000 MCG tablet Take 1,000 mcg by mouth daily.     No current facility-administered medications for this visit.    Allergies as of 08/20/2023   (No Known Allergies)    ROS:  General: Negative for anorexia, weight loss, fever, chills, fatigue, weakness. ENT: Negative for hoarseness, difficulty swallowing , nasal congestion. CV: Negative for chest pain, angina, palpitations, dyspnea on exertion, peripheral edema.  Respiratory: Negative for dyspnea at rest, dyspnea on exertion, cough, sputum, wheezing.  GI: See history of present illness. GU:  Negative for dysuria, hematuria, urinary incontinence, urinary frequency, nocturnal urination.  Endo: Negative for unusual weight change.    Physical Examination:   BP 112/75 (BP Location: Left Arm, Patient Position: Sitting, Cuff Size: Normal)   Pulse 73   Temp 98.1 F (36.7 C) (Oral)   Ht 5\' 1"  (1.549 m)   Wt 128 lb (58.1 kg)   BMI 24.19 kg/m   General: Well-nourished, well-developed in  no acute distress.  Eyes: No icterus. Conjunctivae pink. Neuro: Alert and oriented x 3.  Grossly intact. Skin: Warm and dry, no jaundice.   Psych: Alert and cooperative, normal mood and affect.  Labs:    Imaging Studies: No results found.  Assessment and Plan:   Helen Jones is a 57 y.o. y/o female who comes in today with some dysphagia and weight loss.  The patient had an EGD and colonoscopy back in January.  The patient was found to have a hiatal hernia.   With the report of dysphagia and weight loss the patient will be set up for an upper GI series.  She also reports that her symptoms were better when she took Nexium as compared to the omeprazole.  The patient will be put back on the Nexium.  The patient has been explained the plan and agrees with it.     Midge Minium, MD. Clementeen Graham    Note: This dictation was prepared with Dragon dictation along with smaller phrase technology. Any transcriptional errors that result from this process are unintentional.

## 2023-08-27 ENCOUNTER — Telehealth: Payer: Self-pay

## 2023-08-27 ENCOUNTER — Other Ambulatory Visit: Payer: Self-pay | Admitting: Gastroenterology

## 2023-08-27 ENCOUNTER — Ambulatory Visit: Payer: Medicaid Other

## 2023-08-27 ENCOUNTER — Ambulatory Visit
Admission: RE | Admit: 2023-08-27 | Discharge: 2023-08-27 | Disposition: A | Discharge status: Home / Self Care | Payer: Medicaid Other | Source: Ambulatory Visit | Attending: Gastroenterology | Admitting: Gastroenterology

## 2023-08-27 DIAGNOSIS — R1310 Dysphagia, unspecified: Secondary | ICD-10-CM | POA: Insufficient documentation

## 2023-08-27 DIAGNOSIS — K219 Gastro-esophageal reflux disease without esophagitis: Secondary | ICD-10-CM

## 2023-08-27 NOTE — Telephone Encounter (Signed)
Patient lvm yesterday 08/26/23 on my voicemail stated she has questions about her procedure for tomorrow.  Since I was out of the office yesterday I was not able to return phone call to her.  She was scheduled to have her swallowing study this morning not a colonoscopy.  Thanks,  [place], [place]

## 2023-09-25 ENCOUNTER — Ambulatory Visit
Admission: EM | Admit: 2023-09-25 | Discharge: 2023-09-25 | Disposition: A | Discharge status: Home / Self Care | Payer: Medicaid Other

## 2023-09-25 DIAGNOSIS — J441 Chronic obstructive pulmonary disease with (acute) exacerbation: Secondary | ICD-10-CM | POA: Diagnosis not present

## 2023-09-25 DIAGNOSIS — F17200 Nicotine dependence, unspecified, uncomplicated: Secondary | ICD-10-CM

## 2023-09-25 HISTORY — DX: Ventral hernia without obstruction or gangrene: K43.9

## 2023-09-25 MED ORDER — PREDNISONE 10 MG PO TABS: 40.0000 mg | ORAL_TABLET | Freq: Every day | ORAL | 0 refills | Status: AC

## 2023-09-25 MED ORDER — DOXYCYCLINE HYCLATE 100 MG PO CAPS: 100.0000 mg | ORAL_CAPSULE | Freq: Two times a day (BID) | ORAL | 0 refills | Status: AC

## 2023-09-25 NOTE — ED Provider Notes (Signed)
Renaldo Fiddler    CSN: [id number] Arrival date & time: 09/25/23  1004      History   Chief Complaint Chief Complaint  Patient presents with   Cough   Nasal Congestion    HPI Helen Jones is a 57 y.o. female.  Patient presents with 1 week history of fever, congestion, cough, headache.  Reports temp 102 this morning.  Treating with DayQuil and NyQuil.  She denies chest pain, shortness of breath, or other symptoms.  Her medical history includes COPD.  Current everyday smoker.  The history is provided by the patient and medical records.    Past Medical History:  Diagnosis Date   Anxiety    COPD (chronic obstructive pulmonary disease) (HCC)    Depression    Eczema    GERD (gastroesophageal reflux disease)    Hernia of abdominal wall    Hypothyroid     Patient Active Problem List   Diagnosis Date Noted   History of colonic polyps 10/30/2022   Polyp of descending colon 10/30/2022   Low back pain 09/25/2022   Arm pain, left 03/13/2021   Psoriasis 11/25/2020   Dark stools 04/26/2020   Hyperlipemia 03/25/2020   Chronic obstructive pulmonary disease (HCC) 03/25/2020   Encounter for screening for lung cancer 07/07/2019   Hypernatremia 06/17/2017   Fatigue 05/31/2017   Psoriasis of scalp 05/31/2017   Special screening for malignant neoplasms, colon    Benign neoplasm of ascending colon    Benign neoplasm of transverse colon    Benign neoplasm of sigmoid colon    Allergic rhinitis 12/25/2013   Overweight 04/20/2011   Eczema 04/20/2011   VAGINITIS, ATROPHIC, POSTMENOPAUSAL 08/16/2010   OSTEOARTHRITIS, SACROILIAC JOINT 10/20/2007   Migraine headache 05/12/2007   Anxiety state 01/13/2007   GERD 01/13/2007   Hypothyroidism 12/05/2006   Major depressive disorder, recurrent episode (HCC) 12/05/2006   Tobacco abuse 12/05/2006    Past Surgical History:  Procedure Laterality Date   CHOLECYSTECTOMY     COLONOSCOPY WITH PROPOFOL N/A 09/27/2015   Procedure:  COLONOSCOPY WITH PROPOFOL;  Surgeon: Midge Minium, MD;  Location: [facility] ENDOSCOPY;  Service: Endoscopy;  Laterality: N/A;   COLONOSCOPY WITH PROPOFOL N/A 10/30/2022   Procedure: COLONOSCOPY WITH PROPOFOL;  Surgeon: Midge Minium, MD;  Location[facility] ENDOSCOPY;  Service: Endoscopy;  Laterality: N/A;   ESOPHAGOGASTRODUODENOSCOPY N/A 10/30/2022   Procedure: ESOPHAGOGASTRODUODENOSCOPY (EGD);  Surgeon: Midge Minium, MD;  Location: [facility] ENDOSCOPY;  Service: Endoscopy;  Laterality: N/A;   TUBAL LIGATION      OB History   No obstetric history on file.      Home Medications    Prior to Admission medications   Medication Sig Start Date End Date Taking? Authorizing Provider  doxycycline (VIBRAMYCIN) 100 MG capsule Take 1 capsule (100 mg total) by mouth 2 (two) times daily for 7 days. 09/25/23 10/02/23 Yes Mickie Bail, NP  predniSONE (DELTASONE) 10 MG tablet Take 4 tablets (40 mg total) by mouth daily for 5 days. 09/25/23 09/30/23 Yes Mickie Bail, NP  albuterol (VENTOLIN HFA) 108 (90 Base) MCG/ACT inhaler INHALE 2 PUFFS INTO THE LUNGS EVERY 4 HOURS AS NEEDED FOR WHEEZING OR SHORTNESS OF BREATH. 11/26/22   [name], [name], DO  Calcium Carbonate-Vitamin D3 (CALCIUM 600/VITAMIN D) 600-400 MG-UNIT TABS Take 2 tablets by mouth daily with meal. 06/05/17   Gonfa, Boyce Medici, MD  escitalopram (LEXAPRO) 10 MG tablet Take 1 tablet (10 mg total) by mouth daily. 07/06/19   Sandre Kitty, MD  esomeprazole (  NEXIUM) 40 MG capsule Take 1 capsule (40 mg total) by mouth daily at 12 noon. 08/20/23   Midge Minium, MD  fexofenadine (ALLEGRA) 180 MG tablet Take 180 mg by mouth daily. 05/07/23   [provider]  fluticasone (FLONASE) 50 MCG/ACT nasal spray Place 1 spray into both nostrils daily.    [provider]  ipratropium (ATROVENT) 0.06 % nasal spray Place 2 sprays into both nostrils 4 (four) times daily as needed for rhinitis. 03/28/23   [name], [name], DO  levothyroxine (SYNTHROID) 100 MCG tablet TAKE 1 TABLET  BY MOUTH EVERY DAY BEFORE BREAKFAST 02/15/23   [name], [name], DO  pravastatin (PRAVACHOL) 40 MG tablet Take 1 tablet (40 mg total) by mouth daily. Please schedule office visit before next refill. 07/08/23   Lorayne Bender, MD  vitamin B-12 (CYANOCOBALAMIN) 1000 MCG tablet Take 1,000 mcg by mouth daily.    [provider]    Family History Family History  Problem Relation Age of Onset   Stomach cancer Other    Colitis Other    Diabetes Other     Social History Social History   Tobacco Use   Smoking status: Every Day    Current packs/day: 0.50    Average packs/day: 0.5 packs/day for 46.0 years (23.0 ttl pk-yrs)    Types: Cigarettes    Start date: 10/08/1977    Passive exposure: Current   Smokeless tobacco: Never   Tobacco comments:    Previous 2 ppd smoker.    Vaping Use   Vaping status: Never Used  Substance Use Topics   Alcohol use: No    Alcohol/week: 0.0 standard drinks of alcohol   Drug use: No     Allergies   Patient has no known allergies.   Review of Systems Review of Systems  Constitutional:  Positive for fever. Negative for chills.  HENT:  Positive for congestion. Negative for ear pain and sore throat.   Respiratory:  Positive for cough. Negative for shortness of breath.   Cardiovascular:  Negative for chest pain and palpitations.  Neurological:  Positive for headaches.     Physical Exam Triage Vital Signs ED Triage Vitals  Encounter Vitals Group     BP 09/25/23 1152 128/83     Systolic BP Percentile --      Diastolic BP Percentile --      Pulse Rate 09/25/23 1139 81     Resp 09/25/23 1139 18     Temp 09/25/23 1139 98.6 F (37 C)     Temp src --      SpO2 09/25/23 1139 96 %     Weight --      Height --      Head Circumference --      Peak Flow --      Pain Score 09/25/23 1151 5     Pain Loc --      Pain Education --      Exclude from Growth Chart --    No data found.  Updated Vital Signs BP 128/83   Pulse 81   Temp 98.6 F (37 C)    Resp 18   SpO2 96%   Visual Acuity Right Eye Distance:   Left Eye Distance:   Bilateral Distance:    Right Eye Near:   Left Eye Near:    Bilateral Near:     Physical Exam Constitutional:      General: She is not in acute distress. HENT:     Right Ear: Tympanic  membrane normal.     Left Ear: Tympanic membrane normal.     Nose: Congestion present.     Mouth/Throat:     Mouth: Mucous membranes are moist.     Pharynx: Oropharynx is clear.  Cardiovascular:     Rate and Rhythm: Normal rate and regular rhythm.     Heart sounds: Normal heart sounds.  Pulmonary:     Effort: Pulmonary effort is normal. No respiratory distress.     Breath sounds: Normal breath sounds.  Skin:    General: Skin is warm and dry.  Neurological:     Mental Status: She is alert.      UC Treatments / Results  Labs (all labs ordered are listed, but only abnormal results are displayed) Labs Reviewed - No data to display  EKG   Radiology No results found.  Procedures Procedures (including critical care time)  Medications Ordered in UC Medications - No data to display  Initial Impression / Assessment and Plan / UC Course  I have reviewed the triage vital signs and the nursing notes.  Pertinent labs & imaging results that were available during my care of the patient were reviewed by me and considered in my medical decision making (see chart for details).   COPD exacerbation.  Current everyday smoker.  Afebrile and vital signs are stable.  No respiratory distress.  O2 sat 96% on room air.  Treating with prednisone, doxycycline, continued use of albuterol inhaler.  Instructed patient to follow-up with her PCP tomorrow.  ED precautions given.  Education provided on COPD exacerbation.  Patient agrees to plan of care.    Final Clinical Impressions(s) / UC Diagnoses   Final diagnoses:  COPD exacerbation (HCC)  Current every day smoker     Discharge Instructions      Take the prednisone and  doxycycline as directed.  Use your albuterol inhaler as directed.  Follow up with your primary care provider tomorrow.  Go to the emergency department if you have worsening symptoms.        ED Prescriptions     Medication Sig Dispense Auth. Provider   predniSONE (DELTASONE) 10 MG tablet Take 4 tablets (40 mg total) by mouth daily for 5 days. 20 tablet Mickie Bail, NP   doxycycline (VIBRAMYCIN) 100 MG capsule Take 1 capsule (100 mg total) by mouth 2 (two) times daily for 7 days. 14 capsule Mickie Bail, NP      PDMP not reviewed this encounter.   Mickie Bail, NP 09/25/23 [phone]

## 2023-09-25 NOTE — Discharge Instructions (Addendum)
Take the prednisone and doxycycline as directed.  Use your albuterol inhaler as directed.  Follow up with your primary care provider tomorrow.  Go to the emergency department if you have worsening symptoms.

## 2023-09-25 NOTE — ED Triage Notes (Signed)
Patient to Urgent Care with complaints of  cough/ nasal congestion/ headache. Fevers (max 102).  Symptoms started one week ago.   Taking Dayquil/ Nyquil/ Hall's cough drops.

## 2023-10-01 ENCOUNTER — Other Ambulatory Visit: Payer: Self-pay | Admitting: Family Medicine

## 2023-10-01 DIAGNOSIS — E785 Hyperlipidemia, unspecified: Secondary | ICD-10-CM

## 2023-10-24 ENCOUNTER — Encounter: Payer: Self-pay | Admitting: *Deleted

## 2023-10-24 ENCOUNTER — Ambulatory Visit
Admission: EM | Admit: 2023-10-24 | Discharge: 2023-10-24 | Disposition: A | Discharge status: Home / Self Care | Payer: Medicaid Other | Attending: Emergency Medicine | Admitting: Emergency Medicine

## 2023-10-24 ENCOUNTER — Ambulatory Visit (INDEPENDENT_AMBULATORY_CARE_PROVIDER_SITE_OTHER): Payer: Medicaid Other

## 2023-10-24 DIAGNOSIS — M25472 Effusion, left ankle: Secondary | ICD-10-CM

## 2023-10-24 DIAGNOSIS — M25572 Pain in left ankle and joints of left foot: Secondary | ICD-10-CM

## 2023-10-24 NOTE — Discharge Instructions (Addendum)
Your xray is pending.    Rest and elevate your ankle.  Wear the walking boot as directed.    Follow up with an orthopedist.    Go to the emergency department if you have worsening symptoms.

## 2023-10-24 NOTE — ED Provider Notes (Signed)
Helen Jones    CSN: [id number] Arrival date & time: 10/24/23  1509      History   Chief Complaint Chief Complaint  Patient presents with   Ankle Pain    HPI Helen Jones is a 58 y.o. female.  Patient presents with discomfort and swelling of her left ankle x 6 days.  Her symptoms started after she wore a new pair of boots.  No falls or injury.  She states the swelling improves after she has her ankle up at night but then returns as she is on her feet during the day.  No wounds, redness, bruising, numbness, weakness.  No calf pain or shortness of breath.  The history is provided by the patient and medical records.    Past Medical History:  Diagnosis Date   Anxiety    COPD (chronic obstructive pulmonary disease) (HCC)    Depression    Eczema    GERD (gastroesophageal reflux disease)    Hernia of abdominal wall    Hypothyroid     Patient Active Problem List   Diagnosis Date Noted   History of colonic polyps 10/30/2022   Polyp of descending colon 10/30/2022   Low back pain 09/25/2022   Arm pain, left 03/13/2021   Psoriasis 11/25/2020   Dark stools 04/26/2020   Hyperlipemia 03/25/2020   Chronic obstructive pulmonary disease (HCC) 03/25/2020   Encounter for screening for lung cancer 07/07/2019   Hypernatremia 06/17/2017   Fatigue 05/31/2017   Psoriasis of scalp 05/31/2017   Special screening for malignant neoplasms, colon    Benign neoplasm of ascending colon    Benign neoplasm of transverse colon    Benign neoplasm of sigmoid colon    Allergic rhinitis 12/25/2013   Overweight 04/20/2011   Eczema 04/20/2011   VAGINITIS, ATROPHIC, POSTMENOPAUSAL 08/16/2010   OSTEOARTHRITIS, SACROILIAC JOINT 10/20/2007   Migraine headache 05/12/2007   Anxiety state 01/13/2007   GERD 01/13/2007   Hypothyroidism 12/05/2006   Major depressive disorder, recurrent episode (HCC) 12/05/2006   Tobacco abuse 12/05/2006    Past Surgical History:  Procedure Laterality Date    CHOLECYSTECTOMY     COLONOSCOPY WITH PROPOFOL N/A 09/27/2015   Procedure: COLONOSCOPY WITH PROPOFOL;  Surgeon: Midge Minium, MD;  Location: [facility] ENDOSCOPY;  Service: Endoscopy;  Laterality: N/A;   COLONOSCOPY WITH PROPOFOL N/A 10/30/2022   Procedure: COLONOSCOPY WITH PROPOFOL;  Surgeon: Midge Minium, MD;  Location: [facility] ENDOSCOPY;  Service: Endoscopy;  Laterality: N/A;   ESOPHAGOGASTRODUODENOSCOPY N/A 10/30/2022   Procedure: ESOPHAGOGASTRODUODENOSCOPY (EGD);  Surgeon: Midge Minium, MD;  Location: [facility] ENDOSCOPY;  Service: Endoscopy;  Laterality: N/A;   TUBAL LIGATION      OB History   No obstetric history on file.      Home Medications    Prior to Admission medications   Medication Sig Start Date End Date Taking? Authorizing Provider  albuterol (VENTOLIN HFA) 108 (90 Base) MCG/ACT inhaler INHALE 2 PUFFS INTO THE LUNGS EVERY 4 HOURS AS NEEDED FOR WHEEZING OR SHORTNESS OF BREATH. 11/26/22  Yes [name], [name], DO  Calcium Carbonate-Vitamin D3 (CALCIUM 600/VITAMIN D) 600-400 MG-UNIT TABS Take 2 tablets by mouth daily with meal. 06/05/17  Yes Gonfa, Taye T, MD  escitalopram (LEXAPRO) 10 MG tablet Take 1 tablet (10 mg total) by mouth daily. 07/06/19  Yes Sandre Kitty, MD  esomeprazole (NEXIUM) 40 MG capsule Take 1 capsule (40 mg total) by mouth daily at 12 noon. 08/20/23  Yes Midge Minium, MD  fexofenadine (ALLEGRA) 180 MG tablet Take  180 mg by mouth daily. 05/07/23  Yes [provider]  fluticasone (FLONASE) 50 MCG/ACT nasal spray Place 1 spray into both nostrils daily.   Yes [provider]  ipratropium (ATROVENT) 0.06 % nasal spray Place 2 sprays into both nostrils 4 (four) times daily as needed for rhinitis. 03/28/23  Yes [name], [name], DO  levothyroxine (SYNTHROID) 100 MCG tablet TAKE 1 TABLET BY MOUTH EVERY DAY BEFORE BREAKFAST 02/15/23  Yes [name], [name], DO  pravastatin (PRAVACHOL) 40 MG tablet TAKE 1 TABLET (40 MG TOTAL) BY MOUTH DAILY. PLEASE SCHEDULE OFFICE VISIT BEFORE  NEXT REFILL. 10/03/23  Yes Baloch, Mahnoor, MD  vitamin B-12 (CYANOCOBALAMIN) 1000 MCG tablet Take 1,000 mcg by mouth daily.   Yes [provider]    Family History Family History  Problem Relation Age of Onset   Stomach cancer Other    Colitis Other    Diabetes Other     Social History Social History   Tobacco Use   Smoking status: Every Day    Current packs/day: 0.50    Average packs/day: 0.5 packs/day for 46.0 years (23.0 ttl pk-yrs)    Types: Cigarettes    Start date: 10/08/1977    Passive exposure: Current   Smokeless tobacco: Never   Tobacco comments:    Previous 2 ppd smoker.    Vaping Use   Vaping status: Never Used  Substance Use Topics   Alcohol use: No    Alcohol/week: 0.0 standard drinks of alcohol   Drug use: No     Allergies   Patient has no known allergies.   Review of Systems Review of Systems  Constitutional:  Negative for chills and fever.  Respiratory:  Negative for cough and shortness of breath.   Cardiovascular:  Negative for chest pain and palpitations.  Musculoskeletal:  Positive for arthralgias and joint swelling.  Skin:  Negative for color change, rash and wound.  Neurological:  Negative for weakness and numbness.     Physical Exam Triage Vital Signs ED Triage Vitals  Encounter Vitals Group     BP 10/24/23 1531 122/81     Systolic BP Percentile --      Diastolic BP Percentile --      Pulse Rate 10/24/23 1525 80     Resp 10/24/23 1525 18     Temp 10/24/23 1525 97.9 F (36.6 C)     Temp src --      SpO2 10/24/23 1525 96 %     Weight --      Height --      Head Circumference --      Peak Flow --      Pain Score 10/24/23 1529 6     Pain Loc --      Pain Education --      Exclude from Growth Chart --    No data found.  Updated Vital Signs BP 122/81   Pulse 80   Temp 97.9 F (36.6 C)   Resp 18   SpO2 96%   Visual Acuity Right Eye Distance:   Left Eye Distance:   Bilateral Distance:    Right Eye Near:    Left Eye Near:    Bilateral Near:     Physical Exam Constitutional:      General: She is not in acute distress. HENT:     Mouth/Throat:     Mouth: Mucous membranes are moist.  Cardiovascular:     Rate and Rhythm: Normal rate and regular rhythm.  Heart sounds: Normal heart sounds.  Pulmonary:     Effort: Pulmonary effort is normal. No respiratory distress.     Breath sounds: Normal breath sounds.  Musculoskeletal:        General: Swelling and tenderness present. No deformity. Normal range of motion.       Feet:     Comments: Calf nontender and no swelling.  Skin:    Capillary Refill: Capillary refill takes less than 2 seconds.     Findings: No bruising, erythema, lesion or rash.  Neurological:     General: No focal deficit present.     Mental Status: She is alert and oriented to person, place, and time.     Sensory: No sensory deficit.     Motor: No weakness.     Gait: Gait normal.      UC Treatments / Results  Labs (all labs ordered are listed, but only abnormal results are displayed) Labs Reviewed - No data to display  EKG   Radiology DG Ankle Complete Left Result Date: 10/24/2023 CLINICAL DATA:  Left ankle pain and swelling for the past week. EXAM: LEFT ANKLE COMPLETE - 3+ VIEW COMPARISON:  None Available. FINDINGS: No acute fracture or dislocation. No joint effusion. The ankle mortise is symmetric. The talar dome is intact. Joint spaces are preserved. Osteopenia. Mild lateral hindfoot soft tissue swelling. IMPRESSION: 1. Mild lateral hindfoot soft tissue swelling. No acute osseous abnormality. Electronically Signed   By: Obie Dredge M.D.   On: 10/24/2023 16:17    Procedures Procedures (including critical care time)  Medications Ordered in UC Medications - No data to display  Initial Impression / Assessment and Plan / UC Course  I have reviewed the triage vital signs and the nursing notes.  Pertinent labs & imaging results that were available during  my care of the patient were reviewed by me and considered in my medical decision making (see chart for details).    Left ankle pain and swelling.  No calf swelling or shortness of breath.  Xray of ankle negative.  Treating with walking boot, rest, elevation.  Instructed patient to follow-up with an orthopedist.  Contact information for on-call Ortho provided.  ED precautions given.  Education provided on ankle pain.  She agrees to plan of care.  Final Clinical Impressions(s) / UC Diagnoses   Final diagnoses:  Pain and swelling of left ankle     Discharge Instructions      Your xray is pending.    Rest and elevate your ankle.  Wear the walking boot as directed.    Follow up with an orthopedist.    Go to the emergency department if you have worsening symptoms.         ED Prescriptions   None    PDMP not reviewed this encounter.   Mickie Bail, NP 10/24/23 [phone]

## 2023-10-24 NOTE — ED Triage Notes (Signed)
Pt reports Lt ankle pain for one week. Pt also reports swelling that goes down over night .

## 2023-11-24 ENCOUNTER — Other Ambulatory Visit: Payer: Self-pay | Admitting: Gastroenterology

## 2023-12-25 ENCOUNTER — Encounter: Payer: Self-pay | Admitting: Emergency Medicine

## 2023-12-25 ENCOUNTER — Other Ambulatory Visit: Payer: Self-pay

## 2023-12-25 ENCOUNTER — Ambulatory Visit
Admission: EM | Admit: 2023-12-25 | Discharge: 2023-12-25 | Disposition: A | Discharge status: Home / Self Care | Attending: Emergency Medicine | Admitting: Emergency Medicine

## 2023-12-25 DIAGNOSIS — J069 Acute upper respiratory infection, unspecified: Secondary | ICD-10-CM

## 2023-12-25 LAB — POC COVID19/FLU A&B COMBO
Covid Antigen, POC: NEGATIVE
Influenza A Antigen, POC: NEGATIVE
Influenza B Antigen, POC: NEGATIVE

## 2023-12-25 MED ORDER — PREDNISONE 20 MG PO TABS: 40.0000 mg | ORAL_TABLET | Freq: Every day | ORAL | 0 refills | Status: DC

## 2023-12-25 MED ORDER — ONDANSETRON 4 MG PO TBDP: 4.0000 mg | ORAL_TABLET | Freq: Three times a day (TID) | ORAL | 0 refills | Status: DC | PRN

## 2023-12-25 MED ORDER — LOPERAMIDE HCL 2 MG PO CAPS: 2.0000 mg | ORAL_CAPSULE | Freq: Four times a day (QID) | ORAL | 0 refills | Status: DC | PRN

## 2023-12-25 MED ORDER — AZITHROMYCIN 250 MG PO TABS: 250.0000 mg | ORAL_TABLET | Freq: Every day | ORAL | 0 refills | Status: DC

## 2023-12-25 NOTE — ED Provider Notes (Signed)
 Helen Jones    CSN: [id number] Arrival date & time: 12/25/23  1359      History   Chief Complaint Chief Complaint  Patient presents with   URI    HPI Helen Jones is a 58 y.o. female.   Patient presents for evaluation of nasal congestion, productive cough, left-sided ear pain, shortness of breath and wheezing primarily at nighttime present for 7 days.  Over the last 2 days has begun to experience nausea without vomiting, diarrhea and a intermittent headache.  Associated general malaise and fatigue.  Difficulty tolerating food and liquids since new symptoms began.  No known new sick contact.  History of COPD, has been using inhaler with.  Additionally has taken BC powder, Tylenol, Mucinex, Hall's cough drops and antihistamines.  Past Medical History:  Diagnosis Date   Anxiety    COPD (chronic obstructive pulmonary disease) (HCC)    Depression    Eczema    GERD (gastroesophageal reflux disease)    Hernia of abdominal wall    Hypothyroid     Patient Active Problem List   Diagnosis Date Noted   History of colonic polyps 10/30/2022   Polyp of descending colon 10/30/2022   Low back pain 09/25/2022   Arm pain, left 03/13/2021   Psoriasis 11/25/2020   Dark stools 04/26/2020   Hyperlipemia 03/25/2020   Chronic obstructive pulmonary disease (HCC) 03/25/2020   Encounter for screening for lung cancer 07/07/2019   Hypernatremia 06/17/2017   Fatigue 05/31/2017   Psoriasis of scalp 05/31/2017   Special screening for malignant neoplasms, colon    Benign neoplasm of ascending colon    Benign neoplasm of transverse colon    Benign neoplasm of sigmoid colon    Allergic rhinitis 12/25/2013   Overweight 04/20/2011   Eczema 04/20/2011   VAGINITIS, ATROPHIC, POSTMENOPAUSAL 08/16/2010   OSTEOARTHRITIS, SACROILIAC JOINT 10/20/2007   Migraine headache 05/12/2007   Anxiety state 01/13/2007   GERD 01/13/2007   Hypothyroidism 12/05/2006   Major depressive disorder,  recurrent episode (HCC) 12/05/2006   Tobacco abuse 12/05/2006    Past Surgical History:  Procedure Laterality Date   CHOLECYSTECTOMY     COLONOSCOPY WITH PROPOFOL N/A 09/27/2015   Procedure: COLONOSCOPY WITH PROPOFOL;  Surgeon: Midge Minium, MD;  Location: [facility] ENDOSCOPY;  Service: Endoscopy;  Laterality: N/A;   COLONOSCOPY WITH PROPOFOL N/A 10/30/2022   Procedure: COLONOSCOPY WITH PROPOFOL;  Surgeon: Midge Minium, MD;  Location: [facility] ENDOSCOPY;  Service: Endoscopy;  Laterality: N/A;   ESOPHAGOGASTRODUODENOSCOPY N/A 10/30/2022   Procedure: ESOPHAGOGASTRODUODENOSCOPY (EGD);  Surgeon: Midge Minium, MD;  Location: [facility] ENDOSCOPY;  Service: Endoscopy;  Laterality: N/A;   TUBAL LIGATION      OB History   No obstetric history on file.      Home Medications    Prior to Admission medications   Medication Sig Start Date End Date Taking? Authorizing Provider  azithromycin (ZITHROMAX) 250 MG tablet Take 1 tablet (250 mg total) by mouth daily. Take first 2 tablets together, then 1 every day until finished. 12/25/23  Yes Donnajean Chesnut, Elita Boone, NP  loperamide (IMODIUM) 2 MG capsule Take 1 capsule (2 mg total) by mouth 4 (four) times daily as needed for diarrhea or loose stools. 12/25/23  Yes Tung Pustejovsky R, NP  ondansetron (ZOFRAN-ODT) 4 MG disintegrating tablet Take 1 tablet (4 mg total) by mouth every 8 (eight) hours as needed. 12/25/23  Yes Antonique Langford R, NP  predniSONE (DELTASONE) 20 MG tablet Take 2 tablets (40 mg total) by mouth daily.  12/25/23  Yes Marylee Belzer R, NP  albuterol (VENTOLIN HFA) 108 (90 Base) MCG/ACT inhaler INHALE 2 PUFFS INTO THE LUNGS EVERY 4 HOURS AS NEEDED FOR WHEEZING OR SHORTNESS OF BREATH. 11/26/22   [name], [name], DO  Calcium Carbonate-Vitamin D3 (CALCIUM 600/VITAMIN D) 600-400 MG-UNIT TABS Take 2 tablets by mouth daily with meal. 06/05/17   Gonfa, Boyce Medici, MD  escitalopram (LEXAPRO) 10 MG tablet Take 1 tablet (10 mg total) by mouth daily. 07/06/19   Sandre Kitty, MD   esomeprazole (NEXIUM) 40 MG capsule TAKE 1 CAPSULE (40 MG TOTAL) BY MOUTH DAILY AT 12 NOON. 11/25/23   Midge Minium, MD  fexofenadine (ALLEGRA) 180 MG tablet Take 180 mg by mouth daily. 05/07/23   [provider]  fluticasone (FLONASE) 50 MCG/ACT nasal spray Place 1 spray into both nostrils daily.    [provider]  ipratropium (ATROVENT) 0.06 % nasal spray Place 2 sprays into both nostrils 4 (four) times daily as needed for rhinitis. 03/28/23   [name], [name], DO  levothyroxine (SYNTHROID) 100 MCG tablet TAKE 1 TABLET BY MOUTH EVERY DAY BEFORE BREAKFAST 02/15/23   [name], [name], DO  pravastatin (PRAVACHOL) 40 MG tablet TAKE 1 TABLET (40 MG TOTAL) BY MOUTH DAILY. PLEASE SCHEDULE OFFICE VISIT BEFORE NEXT REFILL. 10/03/23   Baloch, Mahnoor, MD  vitamin B-12 (CYANOCOBALAMIN) 1000 MCG tablet Take 1,000 mcg by mouth daily.    [provider]    Family History Family History  Problem Relation Age of Onset   Stomach cancer Other    Colitis Other    Diabetes Other     Social History Social History   Tobacco Use   Smoking status: Every Day    Current packs/day: 0.50    Average packs/day: 0.5 packs/day for 46.2 years (23.1 ttl pk-yrs)    Types: Cigarettes    Start date: 10/08/1977    Passive exposure: Current   Smokeless tobacco: Never   Tobacco comments:    Previous 2 ppd smoker.    Vaping Use   Vaping status: Never Used  Substance Use Topics   Alcohol use: No    Alcohol/week: 0.0 standard drinks of alcohol   Drug use: No     Allergies   Patient has no known allergies.   Review of Systems Review of Systems   Physical Exam Triage Vital Signs ED Triage Vitals [12/25/23 1416]  Encounter Vitals Group     BP 129/86     Systolic BP Percentile      Diastolic BP Percentile      Pulse Rate 81     Resp 18     Temp 99.3 F (37.4 C)     Temp Source Oral     SpO2 96 %     Weight      Height      Head Circumference      Peak Flow      Pain Score 3      Pain Loc      Pain Education      Exclude from Growth Chart    No data found.  Updated Vital Signs BP 129/86 (BP Location: Left Arm)   Pulse 81   Temp 99.3 F (37.4 C) (Oral)   Resp 18   SpO2 96%   Visual Acuity Right Eye Distance:   Left Eye Distance:   Bilateral Distance:    Right Eye Near:   Left Eye Near:    Bilateral Near:     Physical Exam  Constitutional:      Appearance: Normal appearance.  HENT:     Head: Normocephalic.     Right Ear: Tympanic membrane, ear canal and external ear normal.     Left Ear: Tympanic membrane, ear canal and external ear normal.     Nose: Congestion present. No rhinorrhea.     Mouth/Throat:     Mouth: Mucous membranes are moist.     Pharynx: Oropharynx is clear. No posterior oropharyngeal erythema.  Eyes:     Extraocular Movements: Extraocular movements intact.  Cardiovascular:     Rate and Rhythm: Normal rate and regular rhythm.     Pulses: Normal pulses.     Heart sounds: Normal heart sounds.  Pulmonary:     Effort: Pulmonary effort is normal.     Breath sounds: Normal breath sounds.  Musculoskeletal:     Cervical back: Normal range of motion and neck supple.  Neurological:     Mental Status: She is alert and oriented to person, place, and time. Mental status is at baseline.      UC Treatments / Results  Labs (all labs ordered are listed, but only abnormal results are displayed) Labs Reviewed  POC COVID19/FLU A&B COMBO - Normal    EKG   Radiology No results found.  Procedures Procedures (including critical care time)  Medications Ordered in UC Medications - No data to display  Initial Impression / Assessment and Plan / UC Course  I have reviewed the triage vital signs and the nursing notes.  Pertinent labs & imaging results that were available during my care of the patient were reviewed by me and considered in my medical decision making (see chart for details).  Acute URI  Patient is in no signs of  distress nor toxic appearing.  Vital signs are stable.  Low suspicion for pneumonia, pneumothorax or bronchitis and therefore will defer imaging.  Flu test negative.  New symptoms beginning after 7 days with no known known exposure therefore will provide bacterial coverage.  Prescribed azithromycin, prednisone Imodium and Zofran. May use additional over-the-counter medications as needed for supportive care.  May follow-up with urgent care as needed if symptoms persist or worsen.  Note given.   Final Clinical Impressions(s) / UC Diagnoses   Final diagnoses:  Acute URI     Discharge Instructions      Covid and flu test negative  Symptoms present for 7 days and you are beginning to experience new symptoms therefore will provide bacterial coverage  Begin Azithromycin as directed  Begin prednisone every morning with food as directed to open and relax the airway, should help with shortness of breath and wheezing  You may use Imodium every 6 hours as needed for diarrhea  May use Zofran every 8 hours as needed for nausea    You can take Tylenol and/or Ibuprofen as needed for fever reduction and pain relief.   For cough: honey 1/2 to 1 teaspoon (you can dilute the honey in water or another fluid).  You can also use guaifenesin and dextromethorphan for cough. You can use a humidifier for chest congestion and cough.  If you don't have a humidifier, you can sit in the bathroom with the hot shower running.      For sore throat: try warm salt water gargles, cepacol lozenges, throat spray, warm tea or water with lemon/honey, popsicles or ice, or OTC cold relief medicine for throat discomfort.   For congestion: take a daily anti-histamine like Zyrtec, Claritin, and a oral  decongestant, such as pseudoephedrine.  You can also use Flonase 1-2 sprays in each nostril daily.   It is important to stay hydrated: drink plenty of fluids (water, gatorade/powerade/pedialyte, juices, or teas) to keep your throat  moisturized and help further relieve irritation/discomfort.    ED Prescriptions     Medication Sig Dispense Auth. Provider   azithromycin (ZITHROMAX) 250 MG tablet Take 1 tablet (250 mg total) by mouth daily. Take first 2 tablets together, then 1 every day until finished. 6 tablet Jeff Frieden R, NP   predniSONE (DELTASONE) 20 MG tablet Take 2 tablets (40 mg total) by mouth daily. 10 tablet Salli Quarry R, NP   loperamide (IMODIUM) 2 MG capsule Take 1 capsule (2 mg total) by mouth 4 (four) times daily as needed for diarrhea or loose stools. 12 capsule Nailani Full R, NP   ondansetron (ZOFRAN-ODT) 4 MG disintegrating tablet Take 1 tablet (4 mg total) by mouth every 8 (eight) hours as needed. 20 tablet Valinda Hoar, NP      PDMP not reviewed this encounter.   Valinda Hoar, NP 12/25/23 1437

## 2023-12-25 NOTE — ED Triage Notes (Signed)
 Patient presents to [facility] for evaluation of 1 week of nasal congestion with diarrhea starting Monday. Nausea starting Tuesday with continued queasiness, generalized weakness, chills and headache starting today.

## 2023-12-25 NOTE — Discharge Instructions (Signed)
 Covid and flu test negative  Symptoms present for 7 days and you are beginning to experience new symptoms therefore will provide bacterial coverage  Begin Azithromycin as directed  Begin prednisone every morning with food as directed to open and relax the airway, should help with shortness of breath and wheezing  You may use Imodium every 6 hours as needed for diarrhea  May use Zofran every 8 hours as needed for nausea    You can take Tylenol and/or Ibuprofen as needed for fever reduction and pain relief.   For cough: honey 1/2 to 1 teaspoon (you can dilute the honey in water or another fluid).  You can also use guaifenesin and dextromethorphan for cough. You can use a humidifier for chest congestion and cough.  If you don't have a humidifier, you can sit in the bathroom with the hot shower running.      For sore throat: try warm salt water gargles, cepacol lozenges, throat spray, warm tea or water with lemon/honey, popsicles or ice, or OTC cold relief medicine for throat discomfort.   For congestion: take a daily anti-histamine like Zyrtec, Claritin, and a oral decongestant, such as pseudoephedrine.  You can also use Flonase 1-2 sprays in each nostril daily.   It is important to stay hydrated: drink plenty of fluids (water, gatorade/powerade/pedialyte, juices, or teas) to keep your throat moisturized and help further relieve irritation/discomfort.

## 2024-01-04 ENCOUNTER — Other Ambulatory Visit: Payer: Self-pay | Admitting: Family Medicine

## 2024-01-04 DIAGNOSIS — E785 Hyperlipidemia, unspecified: Secondary | ICD-10-CM

## 2024-01-10 ENCOUNTER — Other Ambulatory Visit: Payer: Self-pay

## 2024-01-10 DIAGNOSIS — J449 Chronic obstructive pulmonary disease, unspecified: Secondary | ICD-10-CM

## 2024-01-12 MED ORDER — ALBUTEROL SULFATE HFA 108 (90 BASE) MCG/ACT IN AERS: INHALATION_SPRAY | RESPIRATORY_TRACT | 3 refills | Status: DC

## 2024-04-05 ENCOUNTER — Other Ambulatory Visit: Payer: Self-pay | Admitting: Family Medicine

## 2024-04-05 DIAGNOSIS — E785 Hyperlipidemia, unspecified: Secondary | ICD-10-CM

## 2024-04-07 ENCOUNTER — Other Ambulatory Visit: Payer: Self-pay

## 2024-04-07 DIAGNOSIS — E039 Hypothyroidism, unspecified: Secondary | ICD-10-CM

## 2024-04-08 MED ORDER — LEVOTHYROXINE SODIUM 100 MCG PO TABS: ORAL_TABLET | ORAL | 1 refills | Status: DC

## 2024-04-22 ENCOUNTER — Ambulatory Visit
Admission: EM | Admit: 2024-04-22 | Discharge: 2024-04-22 | Disposition: A | Discharge status: Home / Self Care | Attending: Emergency Medicine | Admitting: Emergency Medicine

## 2024-04-22 DIAGNOSIS — W19XXXA Unspecified fall, initial encounter: Secondary | ICD-10-CM

## 2024-04-22 DIAGNOSIS — F17200 Nicotine dependence, unspecified, uncomplicated: Secondary | ICD-10-CM | POA: Diagnosis not present

## 2024-04-22 DIAGNOSIS — J441 Chronic obstructive pulmonary disease with (acute) exacerbation: Secondary | ICD-10-CM

## 2024-04-22 MED ORDER — PREDNISONE 10 MG PO TABS: 40.0000 mg | ORAL_TABLET | Freq: Every day | ORAL | 0 refills | Status: AC

## 2024-04-22 MED ORDER — DOXYCYCLINE HYCLATE 100 MG PO CAPS: 100.0000 mg | ORAL_CAPSULE | Freq: Two times a day (BID) | ORAL | 0 refills | Status: AC

## 2024-04-22 NOTE — ED Triage Notes (Signed)
 Pt being seen in UC for nasal congestion, productive cough, HA, and ear pain for approximately 10 days. Pt reports taking mucinex, Flonase , and allergy medication with no relief.

## 2024-04-22 NOTE — Discharge Instructions (Addendum)
 Take the doxycycline  and prednisone  as directed.  Use your albuterol  inhaler as directed.  Follow up with your primary care provider tomorrow.  Go to the emergency department if you have worsening symptoms.

## 2024-04-22 NOTE — ED Provider Notes (Signed)
 CAY RALPH PELT    CSN: [id number] Arrival date & time: 04/22/24  0859      History   Chief Complaint Chief Complaint  Patient presents with   Nasal Congestion   Otalgia    HPI Helen Jones is a 58 y.o. female.  Patient presents with a 10-day history of ear pain, congestion, cough, headache.  Increased sputum production.  Several OTC treatments attempted without relief.  She states her symptoms got worse yesterday when her power went out in her house was very warm.  Her power was out for several hours but is now back on.  She feels short of breath and her cough is worse in the heat.  She has been using her albuterol  inhaler but it is almost out of activations.  She denies chest pain or current shortness of breath.  Her medical history includes COPD.  The history is provided by the patient and medical records.    Past Medical History:  Diagnosis Date   Anxiety    COPD (chronic obstructive pulmonary disease) (HCC)    Depression    Eczema    GERD (gastroesophageal reflux disease)    Hernia of abdominal wall    Hypothyroid     Patient Active Problem List   Diagnosis Date Noted   History of colonic polyps 10/30/2022   Polyp of descending colon 10/30/2022   Low back pain 09/25/2022   Arm pain, left 03/13/2021   Psoriasis 11/25/2020   Dark stools 04/26/2020   Hyperlipemia 03/25/2020   Chronic obstructive pulmonary disease (HCC) 03/25/2020   Encounter for screening for lung cancer 07/07/2019   Hypernatremia 06/17/2017   Fatigue 05/31/2017   Psoriasis of scalp 05/31/2017   Special screening for malignant neoplasms, colon    Benign neoplasm of ascending colon    Benign neoplasm of transverse colon    Benign neoplasm of sigmoid colon    Allergic rhinitis 12/25/2013   Overweight 04/20/2011   Eczema 04/20/2011   VAGINITIS, ATROPHIC, POSTMENOPAUSAL 08/16/2010   OSTEOARTHRITIS, SACROILIAC JOINT 10/20/2007   Migraine headache 05/12/2007   Anxiety state 01/13/2007    GERD 01/13/2007   Hypothyroidism 12/05/2006   Major depressive disorder, recurrent episode (HCC) 12/05/2006   Tobacco abuse 12/05/2006    Past Surgical History:  Procedure Laterality Date   CHOLECYSTECTOMY     COLONOSCOPY WITH PROPOFOL  N/A 09/27/2015   Procedure: COLONOSCOPY WITH PROPOFOL ;  Surgeon: Rogelia Copping, MD;  Location: [facility] ENDOSCOPY;  Service: Endoscopy;  Laterality: N/A;   COLONOSCOPY WITH PROPOFOL  N/A 10/30/2022   Procedure: COLONOSCOPY WITH PROPOFOL ;  Surgeon: Copping Rogelia, MD;  Location: [facility] ENDOSCOPY;  Service: Endoscopy;  Laterality: N/A;   ESOPHAGOGASTRODUODENOSCOPY N/A 10/30/2022   Procedure: ESOPHAGOGASTRODUODENOSCOPY (EGD);  Surgeon: Copping Rogelia, MD;  Location: [facility] ENDOSCOPY;  Service: Endoscopy;  Laterality: N/A;   TUBAL LIGATION      OB History   No obstetric history on file.      Home Medications    Prior to Admission medications   Medication Sig Start Date End Date Taking? Authorizing Provider  doxycycline  (VIBRAMYCIN ) 100 MG capsule Take 1 capsule (100 mg total) by mouth 2 (two) times daily for 7 days. 04/22/24 04/29/24 Yes Corlis Burnard VEAR, NP  predniSONE  (DELTASONE ) 10 MG tablet Take 4 tablets (40 mg total) by mouth daily for 5 days. 04/22/24 04/27/24 Yes Corlis Burnard VEAR, NP  albuterol  (VENTOLIN  HFA) 108 (90 Base) MCG/ACT inhaler INHALE 2 PUFFS INTO THE LUNGS EVERY 4 HOURS AS NEEDED FOR WHEEZING OR SHORTNESS OF  BREATH. 01/12/24   Adele Song, MD  azithromycin  (ZITHROMAX ) 250 MG tablet Take 1 tablet (250 mg total) by mouth daily. Take first 2 tablets together, then 1 every day until finished. Patient not taking: Reported on 04/22/2024 12/25/23   Teresa Shelba SAUNDERS, NP  Calcium  Carbonate-Vitamin D3 (CALCIUM  600/VITAMIN D ) 600-400 MG-UNIT TABS Take 2 tablets by mouth daily with meal. Patient not taking: Reported on 04/22/2024 06/05/17   Gonfa, Taye T, MD  escitalopram  (LEXAPRO ) 10 MG tablet Take 1 tablet (10 mg total) by mouth daily. Patient not taking: Reported on  04/22/2024 07/06/19   Chandra Toribio POUR, MD  esomeprazole  (NEXIUM ) 40 MG capsule TAKE 1 CAPSULE (40 MG TOTAL) BY MOUTH DAILY AT 12 NOON. 11/25/23   Jinny Carmine, MD  fexofenadine  (ALLEGRA ) 180 MG tablet Take 180 mg by mouth daily. 05/07/23   [provider]  fluticasone  (FLONASE ) 50 MCG/ACT nasal spray Place 1 spray into both nostrils daily.    [provider]  ipratropium (ATROVENT ) 0.06 % nasal spray Place 2 sprays into both nostrils 4 (four) times daily as needed for rhinitis. 03/28/23   [name], [name], DO  levothyroxine  (SYNTHROID ) 100 MCG tablet TAKE 1 TABLET BY MOUTH EVERY DAY BEFORE BREAKFAST 04/08/24   Hindel, Song, MD  loperamide  (IMODIUM ) 2 MG capsule Take 1 capsule (2 mg total) by mouth 4 (four) times daily as needed for diarrhea or loose stools. Patient not taking: Reported on 04/22/2024 12/25/23   Teresa Shelba SAUNDERS, NP  ondansetron  (ZOFRAN -ODT) 4 MG disintegrating tablet Take 1 tablet (4 mg total) by mouth every 8 (eight) hours as needed. Patient not taking: Reported on 04/22/2024 12/25/23   Teresa Shelba SAUNDERS, NP  pravastatin  (PRAVACHOL ) 40 MG tablet TAKE 1 TABLET (40 MG TOTAL) BY MOUTH DAILY. PLEASE SCHEDULE OFFICE VISIT BEFORE NEXT REFILL. 04/07/24   Adele Song, MD  vitamin B-12 (CYANOCOBALAMIN) 1000 MCG tablet Take 1,000 mcg by mouth daily. Patient not taking: Reported on 04/22/2024    [provider]    Family History Family History  Problem Relation Age of Onset   Stomach cancer Other    Colitis Other    Diabetes Other     Social History Social History   Tobacco Use   Smoking status: Every Day    Current packs/day: 0.50    Average packs/day: 0.5 packs/day for 46.5 years (23.3 ttl pk-yrs)    Types: Cigarettes    Start date: 10/08/1977    Passive exposure: Current   Smokeless tobacco: Never   Tobacco comments:    Previous 2 ppd smoker.    Vaping Use   Vaping status: Never Used  Substance Use Topics   Alcohol use: No    Alcohol/week: 0.0 standard drinks  of alcohol   Drug use: No     Allergies   Patient has no known allergies.   Review of Systems Review of Systems  Constitutional:  Negative for chills and fever.  HENT:  Positive for congestion and ear pain. Negative for sore throat.   Respiratory:  Positive for cough, shortness of breath and wheezing.   Cardiovascular:  Negative for chest pain and palpitations.  Neurological:  Positive for headaches.     Physical Exam Triage Vital Signs ED Triage Vitals  Encounter Vitals Group     BP 04/22/24 0913 130/80     Girls Systolic BP Percentile --      Girls Diastolic BP Percentile --      Boys Systolic BP Percentile --  Boys Diastolic BP Percentile --      Pulse Rate 04/22/24 0913 77     Resp 04/22/24 0913 20     Temp 04/22/24 0913 98.2 F (36.8 C)     Temp Source 04/22/24 0913 Oral     SpO2 04/22/24 0913 95 %     Weight --      Height --      Head Circumference --      Peak Flow --      Pain Score 04/22/24 0914 7     Pain Loc --      Pain Education --      Exclude from Growth Chart --    No data found.  Updated Vital Signs BP 130/80 (BP Location: Right Arm)   Pulse 77   Temp 98.2 F (36.8 C) (Oral)   Resp 20   SpO2 95%   Visual Acuity Right Eye Distance:   Left Eye Distance:   Bilateral Distance:    Right Eye Near:   Left Eye Near:    Bilateral Near:     Physical Exam Constitutional:      General: She is not in acute distress. HENT:     Right Ear: Tympanic membrane normal.     Left Ear: Tympanic membrane normal.     Nose: Congestion present.     Mouth/Throat:     Mouth: Mucous membranes are moist.     Pharynx: Oropharynx is clear.  Cardiovascular:     Rate and Rhythm: Normal rate and regular rhythm.     Heart sounds: Normal heart sounds.  Pulmonary:     Effort: Pulmonary effort is normal. No respiratory distress.     Breath sounds: Normal breath sounds. No wheezing, rhonchi or rales.  Neurological:     Mental Status: She is alert.       UC Treatments / Results  Labs (all labs ordered are listed, but only abnormal results are displayed) Labs Reviewed - No data to display  EKG   Radiology No results found.  Procedures Procedures (including critical care time)  Medications Ordered in UC Medications - No data to display  Initial Impression / Assessment and Plan / UC Course  I have reviewed the triage vital signs and the nursing notes.  Pertinent labs & imaging results that were available during my care of the patient were reviewed by me and considered in my medical decision making (see chart for details).    COPD exacerbation, current everyday smoker.  Afebrile and vital signs are stable.  Lungs are clear at this time and O2 sat is 95% on room air.  Patient has 3 refills available for her albuterol  inhaler currently at her pharmacy; instructed her to ask for a refill today when she goes to pick up her medication.  Treating today with prednisone  and doxycycline .  Education provided on COPD exacerbation.  Instructed her to follow-up with her PCP tomorrow.  ED precautions given.  She agrees to plan of care.  Final Clinical Impressions(s) / UC Diagnoses   Final diagnoses:  COPD exacerbation (HCC)  Current every day smoker     Discharge Instructions      Take the doxycycline  and prednisone  as directed.  Use your albuterol  inhaler as directed.  Follow up with your primary care provider tomorrow.  Go to the emergency department if you have worsening symptoms.        ED Prescriptions     Medication Sig Dispense Auth. Provider   predniSONE  (  DELTASONE ) 10 MG tablet Take 4 tablets (40 mg total) by mouth daily for 5 days. 20 tablet Corlis Burnard DEL, NP   doxycycline  (VIBRAMYCIN ) 100 MG capsule Take 1 capsule (100 mg total) by mouth 2 (two) times daily for 7 days. 14 capsule Corlis Burnard DEL, NP      PDMP not reviewed this encounter.   Corlis Burnard DEL, NP 04/22/24 [phone]

## 2024-04-29 ENCOUNTER — Ambulatory Visit (INDEPENDENT_AMBULATORY_CARE_PROVIDER_SITE_OTHER)

## 2024-04-29 ENCOUNTER — Ambulatory Visit
Admission: EM | Admit: 2024-04-29 | Discharge: 2024-04-29 | Disposition: A | Discharge status: Home / Self Care | Attending: Emergency Medicine | Admitting: Emergency Medicine

## 2024-04-29 ENCOUNTER — Telehealth: Payer: Self-pay | Admitting: Emergency Medicine

## 2024-04-29 ENCOUNTER — Encounter: Payer: Self-pay | Admitting: Emergency Medicine

## 2024-04-29 ENCOUNTER — Ambulatory Visit (HOSPITAL_COMMUNITY): Payer: Self-pay

## 2024-04-29 DIAGNOSIS — M25552 Pain in left hip: Secondary | ICD-10-CM | POA: Diagnosis not present

## 2024-04-29 DIAGNOSIS — W19XXXA Unspecified fall, initial encounter: Secondary | ICD-10-CM

## 2024-04-29 DIAGNOSIS — J441 Chronic obstructive pulmonary disease with (acute) exacerbation: Secondary | ICD-10-CM

## 2024-04-29 MED ORDER — BENZONATATE 100 MG PO CAPS: 100.0000 mg | ORAL_CAPSULE | Freq: Three times a day (TID) | ORAL | 0 refills | Status: DC

## 2024-04-29 MED ORDER — PREDNISONE 10 MG (21) PO TBPK: ORAL_TABLET | Freq: Every day | ORAL | 0 refills | Status: DC

## 2024-04-29 MED ORDER — ALBUTEROL SULFATE (2.5 MG/3ML) 0.083% IN NEBU: 2.5000 mg | INHALATION_SOLUTION | Freq: Four times a day (QID) | RESPIRATORY_TRACT | 1 refills | Status: AC | PRN

## 2024-04-29 MED ORDER — LEVOFLOXACIN 500 MG PO TABS: 500.0000 mg | ORAL_TABLET | Freq: Every day | ORAL | 0 refills | Status: DC

## 2024-04-29 NOTE — Telephone Encounter (Signed)
 Reported x-ray results via telephone, 2 patient identifiers use, given telephone number to outpatient imaging to obtain imaging on CD, no change in treatment plan

## 2024-04-29 NOTE — ED Triage Notes (Addendum)
 Patient reports a fall on Tuesday July 15 th. Patient reports left hip. Patient reports she has not taken anything for pain. Rates 6/10.Patient also states needs Chest Xray for COPD.

## 2024-04-29 NOTE — ED Provider Notes (Signed)
 CAY RALPH PELT    CSN: [id number] Arrival date & time: 04/29/24  0815      History   Chief Complaint Chief Complaint  Patient presents with   Hip Pain    HPI Helen Jones is a 58 y.o. female.   Patient presents for evaluation of persistent nasal congestion, productive cough, shortness of breath at rest, wheezing, sinus pressure to the center of the face, sore throat and headache present for 17 days.  Was evaluated 1 week ago in this urgent care, prescribed antibiotics and steroids, endorses some improvement with symptoms but did not fully resolved.  History of COPD, has been using inhaler and nebulizers.  Denies fever.  Patient presents for evaluation of persisting left hip pain beginning 7 days ago after fall.  Endorses her family went out for 15 hours during a storm and while looking for a candle she slipped and fell landing on the left side.  Pain initially was constant but has become intermittent, exacerbated by positioning of the left leg.  Denies numbness or tingling.  Past Medical History:  Diagnosis Date   Anxiety    COPD (chronic obstructive pulmonary disease) (HCC)    Depression    Eczema    GERD (gastroesophageal reflux disease)    Hernia of abdominal wall    Hypothyroid     Patient Active Problem List   Diagnosis Date Noted   History of colonic polyps 10/30/2022   Polyp of descending colon 10/30/2022   Low back pain 09/25/2022   Arm pain, left 03/13/2021   Psoriasis 11/25/2020   Dark stools 04/26/2020   Hyperlipemia 03/25/2020   Chronic obstructive pulmonary disease (HCC) 03/25/2020   Encounter for screening for lung cancer 07/07/2019   Hypernatremia 06/17/2017   Fatigue 05/31/2017   Psoriasis of scalp 05/31/2017   Special screening for malignant neoplasms, colon    Benign neoplasm of ascending colon    Benign neoplasm of transverse colon    Benign neoplasm of sigmoid colon    Allergic rhinitis 12/25/2013   Overweight 04/20/2011   Eczema  04/20/2011   VAGINITIS, ATROPHIC, POSTMENOPAUSAL 08/16/2010   OSTEOARTHRITIS, SACROILIAC JOINT 10/20/2007   Migraine headache 05/12/2007   Anxiety state 01/13/2007   GERD 01/13/2007   Hypothyroidism 12/05/2006   Major depressive disorder, recurrent episode (HCC) 12/05/2006   Tobacco abuse 12/05/2006    Past Surgical History:  Procedure Laterality Date   CHOLECYSTECTOMY     COLONOSCOPY WITH PROPOFOL  N/A 09/27/2015   Procedure: COLONOSCOPY WITH PROPOFOL ;  Surgeon: Rogelia Copping, MD;  Location: [facility] ENDOSCOPY;  Service: Endoscopy;  Laterality: N/A;   COLONOSCOPY WITH PROPOFOL  N/A 10/30/2022   Procedure: COLONOSCOPY WITH PROPOFOL ;  Surgeon: Copping Rogelia, MD;  Location: [facility] ENDOSCOPY;  Service: Endoscopy;  Laterality: N/A;   ESOPHAGOGASTRODUODENOSCOPY N/A 10/30/2022   Procedure: ESOPHAGOGASTRODUODENOSCOPY (EGD);  Surgeon: Copping Rogelia, MD;  Location: [facility] ENDOSCOPY;  Service: Endoscopy;  Laterality: N/A;   TUBAL LIGATION      OB History   No obstetric history on file.      Home Medications    Prior to Admission medications   Medication Sig Start Date End Date Taking? Authorizing Provider  albuterol  (PROVENTIL ) (2.5 MG/3ML) 0.083% nebulizer solution Take 3 mLs (2.5 mg total) by nebulization every 6 (six) hours as needed for wheezing or shortness of breath. 04/29/24  Yes Berkleigh Beckles R, NP  benzonatate  (TESSALON ) 100 MG capsule Take 1 capsule (100 mg total) by mouth every 8 (eight) hours. 04/29/24  Yes Teresa Shelba SAUNDERS, NP  levofloxacin  (LEVAQUIN ) 500 MG tablet Take 1 tablet (500 mg total) by mouth daily. 04/29/24  Yes Riddik Senna R, NP  predniSONE  (STERAPRED UNI-PAK 21 TAB) 10 MG (21) TBPK tablet Take by mouth daily. Take 6 tabs by mouth daily  for 1 days, then 5 tabs for 1 days, then 4 tabs for 1 days, then 3 tabs for 1 days, 2 tabs for 1 days, then 1 tab by mouth daily for 1 days 04/29/24  Yes Janete Quilling R, NP  albuterol  (VENTOLIN  HFA) 108 (90 Base) MCG/ACT inhaler INHALE 2  PUFFS INTO THE LUNGS EVERY 4 HOURS AS NEEDED FOR WHEEZING OR SHORTNESS OF BREATH. 01/12/24   Hindel, Rea, MD  azithromycin  (ZITHROMAX ) 250 MG tablet Take 1 tablet (250 mg total) by mouth daily. Take first 2 tablets together, then 1 every day until finished. Patient not taking: Reported on 04/22/2024 12/25/23   Teresa Shelba SAUNDERS, NP  Calcium  Carbonate-Vitamin D3 (CALCIUM  600/VITAMIN D ) 600-400 MG-UNIT TABS Take 2 tablets by mouth daily with meal. Patient not taking: Reported on 04/22/2024 06/05/17   Gonfa, Taye T, MD  doxycycline  (VIBRAMYCIN ) 100 MG capsule Take 1 capsule (100 mg total) by mouth 2 (two) times daily for 7 days. 04/22/24 04/29/24  Corlis Burnard DEL, NP  escitalopram  (LEXAPRO ) 10 MG tablet Take 1 tablet (10 mg total) by mouth daily. Patient not taking: Reported on 04/22/2024 07/06/19   Chandra Toribio POUR, MD  esomeprazole  (NEXIUM ) 40 MG capsule TAKE 1 CAPSULE (40 MG TOTAL) BY MOUTH DAILY AT 12 NOON. 11/25/23   Jinny Carmine, MD  fexofenadine  (ALLEGRA ) 180 MG tablet Take 180 mg by mouth daily. 05/07/23   [provider]  fluticasone  (FLONASE ) 50 MCG/ACT nasal spray Place 1 spray into both nostrils daily.    [provider]  ipratropium (ATROVENT ) 0.06 % nasal spray Place 2 sprays into both nostrils 4 (four) times daily as needed for rhinitis. 03/28/23   [name], [name], DO  levothyroxine  (SYNTHROID ) 100 MCG tablet TAKE 1 TABLET BY MOUTH EVERY DAY BEFORE BREAKFAST 04/08/24   Hindel, Leah, MD  loperamide  (IMODIUM ) 2 MG capsule Take 1 capsule (2 mg total) by mouth 4 (four) times daily as needed for diarrhea or loose stools. Patient not taking: Reported on 04/22/2024 12/25/23   Teresa Shelba SAUNDERS, NP  ondansetron  (ZOFRAN -ODT) 4 MG disintegrating tablet Take 1 tablet (4 mg total) by mouth every 8 (eight) hours as needed. Patient not taking: Reported on 04/22/2024 12/25/23   Teresa Shelba SAUNDERS, NP  pravastatin  (PRAVACHOL ) 40 MG tablet TAKE 1 TABLET (40 MG TOTAL) BY MOUTH DAILY. PLEASE SCHEDULE OFFICE VISIT  BEFORE NEXT REFILL. 04/07/24   Adele Rea, MD  vitamin B-12 (CYANOCOBALAMIN) 1000 MCG tablet Take 1,000 mcg by mouth daily. Patient not taking: Reported on 04/22/2024    [provider]    Family History Family History  Problem Relation Age of Onset   Stomach cancer Other    Colitis Other    Diabetes Other     Social History Social History   Tobacco Use   Smoking status: Every Day    Current packs/day: 0.50    Average packs/day: 0.5 packs/day for 46.6 years (23.3 ttl pk-yrs)    Types: Cigarettes    Start date: 10/08/1977    Passive exposure: Current   Smokeless tobacco: Never   Tobacco comments:    Previous 2 ppd smoker.    Vaping Use   Vaping status: Never Used  Substance Use Topics   Alcohol use: No  Alcohol/week: 0.0 standard drinks of alcohol   Drug use: No     Allergies   Patient has no known allergies.   Review of Systems Review of Systems   Physical Exam Triage Vital Signs ED Triage Vitals  Encounter Vitals Group     BP 04/29/24 0836 (!) 141/87     Girls Systolic BP Percentile --      Girls Diastolic BP Percentile --      Boys Systolic BP Percentile --      Boys Diastolic BP Percentile --      Pulse Rate 04/29/24 0836 73     Resp 04/29/24 0836 18     Temp 04/29/24 0836 98.5 F (36.9 C)     Temp Source 04/29/24 0836 Oral     SpO2 04/29/24 0835 97 %     Weight --      Height --      Head Circumference --      Peak Flow --      Pain Score 04/29/24 0835 6     Pain Loc --      Pain Education --      Exclude from Growth Chart --    No data found.  Updated Vital Signs BP (!) 141/87 (BP Location: Left Arm)   Pulse 73   Temp 98.5 F (36.9 C) (Oral)   Resp 18   SpO2 97%   Visual Acuity Right Eye Distance:   Left Eye Distance:   Bilateral Distance:    Right Eye Near:   Left Eye Near:    Bilateral Near:     Physical Exam Constitutional:      Appearance: Normal appearance.  HENT:     Right Ear: Tympanic membrane, ear canal  and external ear normal.     Left Ear: Tympanic membrane, ear canal and external ear normal.     Nose: Congestion present.     Mouth/Throat:     Mouth: Mucous membranes are moist.     Pharynx: Oropharynx is clear. No oropharyngeal exudate or posterior oropharyngeal erythema.  Eyes:     Extraocular Movements: Extraocular movements intact.  Cardiovascular:     Rate and Rhythm: Normal rate and regular rhythm.     Pulses: Normal pulses.     Heart sounds: Normal heart sounds.  Pulmonary:     Effort: Pulmonary effort is normal.     Breath sounds: Normal breath sounds.  Musculoskeletal:     Comments: Tenderness present to the lateral aspect of the left hip without ecchymosis swelling or deformity, able to bear weight and complete full range of motion of, 2+ femoral pulse  Neurological:     Mental Status: She is alert and oriented to person, place, and time. Mental status is at baseline.      UC Treatments / Results  Labs (all labs ordered are listed, but only abnormal results are displayed) Labs Reviewed - No data to display  EKG   Radiology No results found.  Procedures Procedures (including critical care time)  Medications Ordered in UC Medications - No data to display  Initial Impression / Assessment and Plan / UC Course  I have reviewed the triage vital signs and the nursing notes.  Pertinent labs & imaging results that were available during my care of the patient were reviewed by me and considered in my medical decision making (see chart for details).  Clinical Course as of 04/29/24 0939  Wed Apr 29, 2024  0920 DG Chest 2 View [  AW]    Clinical Course User Index [AW] Teresa Shelba SAUNDERS, NP    COPD exacerbation, acute left hip pain, fall  Chest x-ray pending, vital signs are stable, O2 saturation 97% on room air and lungs are clear to auscultation, stable for outpatient management as patient is in no signs of distress nontoxic-appearing, no signs of sepsis,  prescribed Levaquin , prednisone  and Tessalon , recommended over-the-counter medication and nonpharmacological supportive care  Hip x-ray pending, prednisone  prescribed, recommended supportive care through [facility] and orthopedic follow-up recommended Final Clinical Impressions(s) / UC Diagnoses   Final diagnoses:  Fall, initial encounter  Acute hip pain, left  COPD exacerbation (HCC)     Discharge Instructions      Take Levaquin  every morning for 7 days to clear out any remaining bacteria contributing to symptoms  For hip and for breathing begin prednisone  every morning with food as directed  Nebulizer medication has been refilled  May use Tessalon  pill as needed for cough     You can take Tylenol  and/or Ibuprofen as needed for fever reduction and pain relief.   For cough: honey 1/2 to 1 teaspoon (you can dilute the honey in water or another fluid).  You can also use guaifenesin and dextromethorphan for cough. You can use a humidifier for chest congestion and cough.  If you don't have a humidifier, you can sit in the bathroom with the hot shower running.      For sore throat: try warm salt water gargles, cepacol lozenges, throat spray, warm tea or water with lemon/honey, popsicles or ice, or OTC cold relief medicine for throat discomfort.   For congestion: take a daily anti-histamine like Zyrtec , Claritin, and a oral decongestant, such as pseudoephedrine.  You can also use Flonase  1-2 sprays in each nostril daily.   It is important to stay hydrated: drink plenty of fluids (water, gatorade/powerade/pedialyte, juices, or teas) to keep your throat moisturized and help further relieve irritation/discomfort.'   ED Prescriptions     Medication Sig Dispense Auth. Provider   predniSONE  (STERAPRED UNI-PAK 21 TAB) 10 MG (21) TBPK tablet Take by mouth daily. Take 6 tabs by mouth daily  for 1 days, then 5 tabs for 1 days, then 4 tabs for 1 days, then 3 tabs for 1 days, 2 tabs for 1 days, then 1  tab by mouth daily for 1 days 21 tablet Kristi Hyer R, NP   levofloxacin  (LEVAQUIN ) 500 MG tablet Take 1 tablet (500 mg total) by mouth daily. 7 tablet Enrique Manganaro R, NP   benzonatate  (TESSALON ) 100 MG capsule Take 1 capsule (100 mg total) by mouth every 8 (eight) hours. 21 capsule Laney Louderback R, NP   albuterol  (PROVENTIL ) (2.5 MG/3ML) 0.083% nebulizer solution Take 3 mLs (2.5 mg total) by nebulization every 6 (six) hours as needed for wheezing or shortness of breath. 75 mL Aavya Shafer, Shelba SAUNDERS, NP      PDMP not reviewed this encounter.   Teresa Shelba SAUNDERS, [place] 04/29/24 [phone]

## 2024-04-29 NOTE — Discharge Instructions (Addendum)
 Take Levaquin  every morning for 7 days to clear out any remaining bacteria contributing to symptoms  For hip and for breathing begin prednisone  every morning with food as directed  Nebulizer medication has been refilled  May use Tessalon  pill as needed for cough     You can take Tylenol  and/or Ibuprofen as needed for fever reduction and pain relief.   For cough: honey 1/2 to 1 teaspoon (you can dilute the honey in water or another fluid).  You can also use guaifenesin and dextromethorphan for cough. You can use a humidifier for chest congestion and cough.  If you don't have a humidifier, you can sit in the bathroom with the hot shower running.      For sore throat: try warm salt water gargles, cepacol lozenges, throat spray, warm tea or water with lemon/honey, popsicles or ice, or OTC cold relief medicine for throat discomfort.   For congestion: take a daily anti-histamine like Zyrtec , Claritin, and a oral decongestant, such as pseudoephedrine.  You can also use Flonase  1-2 sprays in each nostril daily.   It is important to stay hydrated: drink plenty of fluids (water, gatorade/powerade/pedialyte, juices, or teas) to keep your throat moisturized and help further relieve irritation/discomfort.'

## 2024-05-07 ENCOUNTER — Other Ambulatory Visit: Payer: Self-pay | Admitting: Family Medicine

## 2024-05-07 DIAGNOSIS — Z1231 Encounter for screening mammogram for malignant neoplasm of breast: Secondary | ICD-10-CM

## 2024-05-08 ENCOUNTER — Telehealth: Payer: Self-pay | Admitting: Pulmonary Disease

## 2024-05-08 NOTE — Telephone Encounter (Signed)
 LVMTCB to schedule pulmonary consult.

## 2024-05-19 ENCOUNTER — Encounter

## 2024-06-01 ENCOUNTER — Encounter: Payer: Self-pay | Admitting: Emergency Medicine

## 2024-06-01 ENCOUNTER — Ambulatory Visit
Admission: EM | Admit: 2024-06-01 | Discharge: 2024-06-01 | Disposition: A | Discharge status: Home / Self Care | Attending: Emergency Medicine | Admitting: Emergency Medicine

## 2024-06-01 DIAGNOSIS — J069 Acute upper respiratory infection, unspecified: Secondary | ICD-10-CM | POA: Diagnosis not present

## 2024-06-01 DIAGNOSIS — H6693 Otitis media, unspecified, bilateral: Secondary | ICD-10-CM | POA: Diagnosis not present

## 2024-06-01 LAB — POC SOFIA SARS ANTIGEN FIA: SARS Coronavirus 2 Ag: NEGATIVE

## 2024-06-01 MED ORDER — AMOXICILLIN-POT CLAVULANATE 875-125 MG PO TABS: 1.0000 | ORAL_TABLET | Freq: Two times a day (BID) | ORAL | 0 refills | Status: DC

## 2024-06-01 MED ORDER — IPRATROPIUM BROMIDE 0.03 % NA SOLN: 2.0000 | Freq: Two times a day (BID) | NASAL | 0 refills | Status: DC

## 2024-06-01 MED ORDER — PREDNISONE 10 MG (21) PO TBPK: ORAL_TABLET | Freq: Every day | ORAL | 0 refills | Status: DC

## 2024-06-01 NOTE — ED Provider Notes (Signed)
 CAY RALPH PELT    CSN: [id number] Arrival date & time: 06/01/24  1103      History   Chief Complaint Chief Complaint  Patient presents with   Nasal Congestion   Ear Drainage   Ear Fullness    HPI Helen Jones is a 58 y.o. female.   Patient presents for evaluation of head and chest congestion, bilateral ear fullness, pain and green drainage, and a nonproductive cough, intermittent wheezing and vomiting beginning 4 days ago.  Endorses decreased hearing.  Associated increased fatigue and malaise.  Vomiting has resolved, tolerable to some food and liquids but appetite is decreased.  Denies recent swimming or known sick contact.  History of COPD, denies shortness of breath.  Past Medical History:  Diagnosis Date   Anxiety    COPD (chronic obstructive pulmonary disease) (HCC)    Depression    Eczema    GERD (gastroesophageal reflux disease)    Hernia of abdominal wall    Hypothyroid     Patient Active Problem List   Diagnosis Date Noted   History of colonic polyps 10/30/2022   Polyp of descending colon 10/30/2022   Low back pain 09/25/2022   Arm pain, left 03/13/2021   Psoriasis 11/25/2020   Dark stools 04/26/2020   Hyperlipemia 03/25/2020   Chronic obstructive pulmonary disease (HCC) 03/25/2020   Encounter for screening for lung cancer 07/07/2019   Hypernatremia 06/17/2017   Fatigue 05/31/2017   Psoriasis of scalp 05/31/2017   Special screening for malignant neoplasms, colon    Benign neoplasm of ascending colon    Benign neoplasm of transverse colon    Benign neoplasm of sigmoid colon    Allergic rhinitis 12/25/2013   Overweight 04/20/2011   Eczema 04/20/2011   VAGINITIS, ATROPHIC, POSTMENOPAUSAL 08/16/2010   OSTEOARTHRITIS, SACROILIAC JOINT 10/20/2007   Migraine headache 05/12/2007   Anxiety state 01/13/2007   GERD 01/13/2007   Hypothyroidism 12/05/2006   Major depressive disorder, recurrent episode (HCC) 12/05/2006   Tobacco abuse 12/05/2006     Past Surgical History:  Procedure Laterality Date   CHOLECYSTECTOMY     COLONOSCOPY WITH PROPOFOL  N/A 09/27/2015   Procedure: COLONOSCOPY WITH PROPOFOL ;  Surgeon: Rogelia Copping, MD;  Location: [facility] ENDOSCOPY;  Service: Endoscopy;  Laterality: N/A;   COLONOSCOPY WITH PROPOFOL  N/A 10/30/2022   Procedure: COLONOSCOPY WITH PROPOFOL ;  Surgeon: Copping Rogelia, MD;  Location: [facility] ENDOSCOPY;  Service: Endoscopy;  Laterality: N/A;   ESOPHAGOGASTRODUODENOSCOPY N/A 10/30/2022   Procedure: ESOPHAGOGASTRODUODENOSCOPY (EGD);  Surgeon: Copping Rogelia, MD;  Location: [facility] ENDOSCOPY;  Service: Endoscopy;  Laterality: N/A;   TUBAL LIGATION      OB History   No obstetric history on file.      Home Medications    Prior to Admission medications   Medication Sig Start Date End Date Taking? Authorizing Provider  amoxicillin -clavulanate (AUGMENTIN ) 875-125 MG tablet Take 1 tablet by mouth every 12 (twelve) hours. 06/01/24  Yes Carnisha Feltz R, NP  ipratropium (ATROVENT ) 0.03 % nasal spray Place 2 sprays into both nostrils every 12 (twelve) hours. 06/01/24  Yes Eugen Jeansonne R, NP  predniSONE  (STERAPRED UNI-PAK 21 TAB) 10 MG (21) TBPK tablet Take by mouth daily. Take 6 tabs by mouth daily  for 1 days, then 5 tabs for 1 days, then 4 tabs for 1 days, then 3 tabs for 1 days, 2 tabs for 1 days, then 1 tab by mouth daily for 1 days 06/01/24  Yes Jermond Burkemper R, NP  albuterol  (PROVENTIL ) (2.5 MG/3ML) 0.083% nebulizer  solution Take 3 mLs (2.5 mg total) by nebulization every 6 (six) hours as needed for wheezing or shortness of breath. 04/29/24   Ivet Guerrieri, Shelba SAUNDERS, NP  albuterol  (VENTOLIN  HFA) 108 (90 Base) MCG/ACT inhaler INHALE 2 PUFFS INTO THE LUNGS EVERY 4 HOURS AS NEEDED FOR WHEEZING OR SHORTNESS OF BREATH. 01/12/24   Hindel, Rea, MD  azithromycin  (ZITHROMAX ) 250 MG tablet Take 1 tablet (250 mg total) by mouth daily. Take first 2 tablets together, then 1 every day until finished. Patient not taking: Reported on  04/22/2024 12/25/23   Teresa Shelba SAUNDERS, NP  benzonatate  (TESSALON ) 100 MG capsule Take 1 capsule (100 mg total) by mouth every 8 (eight) hours. 04/29/24   Teresa Shelba SAUNDERS, NP  Calcium  Carbonate-Vitamin D3 (CALCIUM  600/VITAMIN D ) 600-400 MG-UNIT TABS Take 2 tablets by mouth daily with meal. Patient not taking: Reported on 04/22/2024 06/05/17   Gonfa, Taye T, MD  escitalopram  (LEXAPRO ) 10 MG tablet Take 1 tablet (10 mg total) by mouth daily. Patient not taking: Reported on 04/22/2024 07/06/19   Chandra Toribio POUR, MD  esomeprazole  (NEXIUM ) 40 MG capsule TAKE 1 CAPSULE (40 MG TOTAL) BY MOUTH DAILY AT 12 NOON. 11/25/23   Jinny Carmine, MD  fexofenadine  (ALLEGRA ) 180 MG tablet Take 180 mg by mouth daily. 05/07/23   [provider]  fluticasone  (FLONASE ) 50 MCG/ACT nasal spray Place 1 spray into both nostrils daily.    [provider]  levofloxacin  (LEVAQUIN ) 500 MG tablet Take 1 tablet (500 mg total) by mouth daily. 04/29/24   Amra Shukla, Shelba SAUNDERS, NP  levothyroxine  (SYNTHROID ) 100 MCG tablet TAKE 1 TABLET BY MOUTH EVERY DAY BEFORE BREAKFAST 04/08/24   Hindel, Rea, MD  loperamide  (IMODIUM ) 2 MG capsule Take 1 capsule (2 mg total) by mouth 4 (four) times daily as needed for diarrhea or loose stools. Patient not taking: Reported on 04/22/2024 12/25/23   Teresa Shelba SAUNDERS, NP  ondansetron  (ZOFRAN -ODT) 4 MG disintegrating tablet Take 1 tablet (4 mg total) by mouth every 8 (eight) hours as needed. Patient not taking: Reported on 04/22/2024 12/25/23   Teresa Shelba SAUNDERS, NP  pravastatin  (PRAVACHOL ) 40 MG tablet TAKE 1 TABLET (40 MG TOTAL) BY MOUTH DAILY. PLEASE SCHEDULE OFFICE VISIT BEFORE NEXT REFILL. 04/07/24   Adele Rea, MD  vitamin B-12 (CYANOCOBALAMIN) 1000 MCG tablet Take 1,000 mcg by mouth daily. Patient not taking: Reported on 04/22/2024    [provider]    Family History Family History  Problem Relation Age of Onset   Stomach cancer Other    Colitis Other    Diabetes Other      Social History Social History   Tobacco Use   Smoking status: Every Day    Current packs/day: 0.50    Average packs/day: 0.5 packs/day for 46.6 years (23.3 ttl pk-yrs)    Types: Cigarettes    Start date: 10/08/1977    Passive exposure: Current   Smokeless tobacco: Never   Tobacco comments:    Previous 2 ppd smoker.    Vaping Use   Vaping status: Never Used  Substance Use Topics   Alcohol use: No    Alcohol/week: 0.0 standard drinks of alcohol   Drug use: No     Allergies   Patient has no known allergies.   Review of Systems Review of Systems   Physical Exam Triage Vital Signs ED Triage Vitals  Encounter Vitals Group     BP 06/01/24 1119 113/79     Girls Systolic BP Percentile --  Girls Diastolic BP Percentile --      Boys Systolic BP Percentile --      Boys Diastolic BP Percentile --      Pulse Rate 06/01/24 1119 76     Resp 06/01/24 1119 18     Temp 06/01/24 1119 97.9 F (36.6 C)     Temp Source 06/01/24 1119 Oral     SpO2 06/01/24 1119 97 %     Weight --      Height --      Head Circumference --      Peak Flow --      Pain Score 06/01/24 1116 0     Pain Loc --      Pain Education --      Exclude from Growth Chart --    No data found.  Updated Vital Signs BP 113/79 (BP Location: Left Arm)   Pulse 76   Temp 97.9 F (36.6 C) (Oral)   Resp 18   SpO2 97%   Visual Acuity Right Eye Distance:   Left Eye Distance:   Bilateral Distance:    Right Eye Near:   Left Eye Near:    Bilateral Near:     Physical Exam Constitutional:      Appearance: Normal appearance.  HENT:     Head: Normocephalic.     Right Ear: Ear canal and external ear normal. Tympanic membrane is erythematous.     Left Ear: Ear canal and external ear normal. Tympanic membrane is erythematous.     Nose: Congestion present.     Mouth/Throat:     Mouth: Mucous membranes are moist.     Pharynx: Oropharynx is clear. No oropharyngeal exudate or posterior oropharyngeal  erythema.  Eyes:     Extraocular Movements: Extraocular movements intact.  Cardiovascular:     Rate and Rhythm: Normal rate and regular rhythm.     Pulses: Normal pulses.     Heart sounds: Normal heart sounds.  Pulmonary:     Effort: Pulmonary effort is normal.     Breath sounds: Normal breath sounds.  Musculoskeletal:     Cervical back: Normal range of motion and neck supple.  Neurological:     Mental Status: She is alert and oriented to person, place, and time. Mental status is at baseline.      UC Treatments / Results  Labs (all labs ordered are listed, but only abnormal results are displayed) Labs Reviewed  POC SOFIA SARS ANTIGEN FIA - Normal    EKG   Radiology No results found.  Procedures Procedures (including critical care time)  Medications Ordered in UC Medications - No data to display  Initial Impression / Assessment and Plan / UC Course  I have reviewed the triage vital signs and the nursing notes.  Pertinent labs & imaging results that were available during my care of the patient were reviewed by me and considered in my medical decision making (see chart for details).  Acute URI, acute bilateral ear infection  Vital signs stable, patient is in no signs of distress nontoxic-appearing, lungs are clear to auscultation O2 saturation 97% on room air, stable for outpatient management, erythema noted to the bilateral tympanic membrane's, discussed findings with patient, consistent with infection therefore will initiate antibiotics, placed on Augmentin , recent use of doxycycline  and Levaquin  for upper respiratory infection 1 month ago, prescribed prednisone  and Atrovent  nasal spray for additional supportive measures, recommended over-the-counter medications and nonpharmacological supportive care with follow-up as needed Final Clinical Impressions(s) /  UC Diagnoses   Final diagnoses:  Acute URI  Acute ear infection, bilateral     Discharge Instructions       Today you are being treated for an infection of the eardrum  Take augmentin  twice daily for 7 days, you should begin to see improvement after 48 hours of medication use and then it should progressively get better  If symptoms are improving along with just antibiotic you may continue using this alone however if some improvement but still having lots of sinus pain and pressure begin prednisone  every morning with food as directed with avoidance of ibuprofen  May use nasal spray additionally to help reduce sinus pressure, may use twice a day  You may use Tylenol  or ibuprofen for management of discomfort  May hold warm compresses to the ear for additional comfort  Please not attempted any ear cleaning or object or fluid placement into the ear canal to prevent further irritation    ED Prescriptions     Medication Sig Dispense Auth. Provider   amoxicillin -clavulanate (AUGMENTIN ) 875-125 MG tablet Take 1 tablet by mouth every 12 (twelve) hours. 14 tablet Zoanne Newill R, NP   predniSONE  (STERAPRED UNI-PAK 21 TAB) 10 MG (21) TBPK tablet Take by mouth daily. Take 6 tabs by mouth daily  for 1 days, then 5 tabs for 1 days, then 4 tabs for 1 days, then 3 tabs for 1 days, 2 tabs for 1 days, then 1 tab by mouth daily for 1 days 21 tablet Tykeisha Peer R, NP   ipratropium (ATROVENT ) 0.03 % nasal spray Place 2 sprays into both nostrils every 12 (twelve) hours. 30 mL Teresa Shelba SAUNDERS, NP      PDMP not reviewed this encounter.   Teresa Shelba SAUNDERS, NP 06/01/24 1235

## 2024-06-01 NOTE — ED Triage Notes (Signed)
 Reports head congestion and feels like ears are stopped up. Patient reports green discharge from ears. Patient states that she used peroxide with no relief. Denies pain

## 2024-06-01 NOTE — Discharge Instructions (Signed)
 Today you are being treated for an infection of the eardrum  Take augmentin  twice daily for 7 days, you should begin to see improvement after 48 hours of medication use and then it should progressively get better  If symptoms are improving along with just antibiotic you may continue using this alone however if some improvement but still having lots of sinus pain and pressure begin prednisone  every morning with food as directed with avoidance of ibuprofen  May use nasal spray additionally to help reduce sinus pressure, may use twice a day  You may use Tylenol  or ibuprofen for management of discomfort  May hold warm compresses to the ear for additional comfort  Please not attempted any ear cleaning or object or fluid placement into the ear canal to prevent further irritation

## 2024-06-18 ENCOUNTER — Ambulatory Visit: Admitting: Pulmonary Disease

## 2024-06-18 ENCOUNTER — Telehealth: Payer: Self-pay

## 2024-06-18 ENCOUNTER — Encounter: Payer: Self-pay | Admitting: Pulmonary Disease

## 2024-06-18 ENCOUNTER — Other Ambulatory Visit
Admission: RE | Admit: 2024-06-18 | Discharge: 2024-06-18 | Disposition: A | Discharge status: Home / Self Care | Attending: Pulmonary Disease | Admitting: Pulmonary Disease

## 2024-06-18 ENCOUNTER — Encounter: Payer: Self-pay | Admitting: *Deleted

## 2024-06-18 VITALS — BP 110/70 | HR 79 | Temp 97.9°F | Ht 61.0 in | Wt 133.0 lb

## 2024-06-18 DIAGNOSIS — J4489 Other specified chronic obstructive pulmonary disease: Secondary | ICD-10-CM

## 2024-06-18 DIAGNOSIS — Z716 Tobacco abuse counseling: Secondary | ICD-10-CM

## 2024-06-18 DIAGNOSIS — H60543 Acute eczematoid otitis externa, bilateral: Secondary | ICD-10-CM | POA: Diagnosis present

## 2024-06-18 DIAGNOSIS — H6093 Unspecified otitis externa, bilateral: Secondary | ICD-10-CM

## 2024-06-18 DIAGNOSIS — L309 Dermatitis, unspecified: Secondary | ICD-10-CM

## 2024-06-18 DIAGNOSIS — R0602 Shortness of breath: Secondary | ICD-10-CM

## 2024-06-18 DIAGNOSIS — J449 Chronic obstructive pulmonary disease, unspecified: Secondary | ICD-10-CM

## 2024-06-18 DIAGNOSIS — F17210 Nicotine dependence, cigarettes, uncomplicated: Secondary | ICD-10-CM

## 2024-06-18 DIAGNOSIS — Z87891 Personal history of nicotine dependence: Secondary | ICD-10-CM

## 2024-06-18 LAB — CBC WITH DIFFERENTIAL/PLATELET
Abs Immature Granulocytes: 0.02 K/uL (ref 0.00–0.07)
Basophils Absolute: 0 K/uL (ref 0.0–0.1)
Basophils Relative: 1 %
Eosinophils Absolute: 0.2 K/uL (ref 0.0–0.5)
Eosinophils Relative: 3 %
HCT: 41.7 % (ref 36.0–46.0)
Hemoglobin: 13.4 g/dL (ref 12.0–15.0)
Immature Granulocytes: 0 %
Lymphocytes Relative: 30 %
Lymphs Abs: 1.8 K/uL (ref 0.7–4.0)
MCH: 32.1 pg (ref 26.0–34.0)
MCHC: 32.1 g/dL (ref 30.0–36.0)
MCV: 99.8 fL (ref 80.0–100.0)
Monocytes Absolute: 0.4 K/uL (ref 0.1–1.0)
Monocytes Relative: 7 %
Neutro Abs: 3.5 K/uL (ref 1.7–7.7)
Neutrophils Relative %: 59 %
Platelets: 234 K/uL (ref 150–400)
RBC: 4.18 MIL/uL (ref 3.87–5.11)
RDW: 13.5 % (ref 11.5–15.5)
WBC: 6 K/uL (ref 4.0–10.5)
nRBC: 0 % (ref 0.0–0.2)

## 2024-06-18 MED ORDER — CIPRO HC 0.2-1 % OT SUSP: 3.0000 [drp] | Freq: Two times a day (BID) | OTIC | 0 refills | Status: AC

## 2024-06-18 MED ORDER — SYMBICORT 160-4.5 MCG/ACT IN AERO: 2.0000 | INHALATION_SPRAY | Freq: Two times a day (BID) | RESPIRATORY_TRACT | 12 refills | Status: AC

## 2024-06-18 MED ORDER — SPIRIVA RESPIMAT 2.5 MCG/ACT IN AERS: 2.0000 | INHALATION_SPRAY | Freq: Every day | RESPIRATORY_TRACT | 11 refills | Status: AC
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
## 2024-06-18 NOTE — Telephone Encounter (Signed)
 Your request has been approved PA Case: [id number], Status: Approved, Coverage Starts on: 06/18/2024 12:00:00 AM, Coverage Ends on: 06/18/2025 12:00:00 AM. Authorization Expiration09/08/2025

## 2024-06-18 NOTE — Telephone Encounter (Signed)
 Copied from CRM [id number]. Topic: Clinical - Prescription Issue >> Jun 18, 2024  2:28 PM Helen Jones wrote: Reason for CRM: Patient states her pharmacy advised her that her insurance will not cover the ciprofloxacin -hydrocortisone  (CIPRO  HC) OTIC suspension and is requesting Dr. Tamea to order an alternative as she can't hear well and needs the ear drops.

## 2024-06-18 NOTE — Patient Instructions (Signed)
 VISIT SUMMARY:  Today, we discussed your chronic obstructive pulmonary disease (COPD) with asthma overlap, your history of tobacco use, and your eczema of the external ear. We reviewed your symptoms, recent exacerbations, and current treatments. We also discussed new prescriptions and upcoming tests to better manage your conditions.  YOUR PLAN:  -CHRONIC OBSTRUCTIVE PULMONARY DISEASE (COPD) WITH ASTHMA OVERLAP: COPD with asthma overlap is a condition where you have symptoms of both COPD and asthma, such as chronic cough and difficulty breathing. We will start you on a Symbicort  inhaler, two puffs twice a day, and you should rinse your mouth after each use. We will also switch your Spiriva  inhaler to a new form that is covered by Medicaid, to be taken once daily. Additionally, we will schedule an annual lung cancer screening CT, order pulmonary function tests, and obtain blood work to monitor your condition.  -TOBACCO USE: Your long history of smoking has contributed to your COPD. Quitting smoking is crucial for managing your COPD and reducing further respiratory issues. We encourage you to continue your efforts to quit smoking.  -ECZEMA OF EXTERNAL EAR (OTITIS EXTERNA): Eczema of the external ear, also known as otitis externa, is causing ear discharge and hearing difficulties. We will prescribe antibiotic ear drops to treat the infection. If your symptoms persist, we may refer you to an ear, nose, and throat specialist.  INSTRUCTIONS:  Please follow the new medication regimen as prescribed. Schedule your annual lung cancer screening CT and pulmonary function tests as soon as possible. Continue your efforts to quit smoking, and use the antibiotic ear drops as directed. If your ear symptoms do not improve, let us  know so we can refer you to a specialist.

## 2024-06-18 NOTE — Telephone Encounter (Signed)
 I called the patient's pharmacy and the medication has been approved and it will be $4.00. They will have it in stock tomorrow.   LMTCB. E2C2 please advise when patient calls back.

## 2024-06-19 NOTE — Telephone Encounter (Addendum)
 Lm x2 for the patient. I will wait for the patient to contact the office.  Nothing further needed.

## 2024-06-20 ENCOUNTER — Ambulatory Visit: Payer: Self-pay | Admitting: Pulmonary Disease

## 2024-06-20 LAB — ALLERGEN PANEL (27) + IGE
Alternaria Alternata IgE: <0.1 kU/L
Aspergillus Fumigatus IgE: <0.1 kU/L
Bahia Grass IgE: <0.1 kU/L
Bermuda Grass IgE: <0.1 kU/L
Cat Dander IgE: <0.1 kU/L
Cedar, Mountain IgE: <0.1 kU/L
Cladosporium Herbarum IgE: <0.1 kU/L
Cocklebur IgE: <0.1 kU/L
Cockroach, American IgE: <0.1 kU/L
Common Silver Birch IgE: <0.1 kU/L
D Farinae IgE: <0.1 kU/L
D Pteronyssinus IgE: <0.1 kU/L
Dog Dander IgE: <0.1 kU/L
Elm, American IgE: <0.1 kU/L
Hickory, White IgE: <0.1 kU/L
IgE (Immunoglobulin E), Serum: 16 [IU]/mL (ref 6–495)
Johnson Grass IgE: <0.1 kU/L
Kentucky Bluegrass IgE: <0.1 kU/L
Maple/Box Elder IgE: <0.1 kU/L
Mucor Racemosus IgE: <0.1 kU/L
Oak, White IgE: <0.1 kU/L
Penicillium Chrysogen IgE: <0.1 kU/L
Pigweed, Rough IgE: <0.1 kU/L
Plantain, English IgE: <0.1 kU/L
Ragweed, Short IgE: <0.1 kU/L
Setomelanomma Rostrat: <0.1 kU/L
Timothy Grass IgE: <0.1 kU/L
White Mulberry IgE: <0.1 kU/L

## 2024-06-21 LAB — ALPHA-1-ANTITRYPSIN PHENOTYP: A-1 Antitrypsin, Ser: 183 mg/dL (ref 101–187)

## 2024-06-23 ENCOUNTER — Telehealth: Payer: Self-pay

## 2024-06-23 NOTE — Telephone Encounter (Signed)
 These are usually related more to the indoor environment during the winter months that can be drier.  This actually makes people more susceptible to infections.  There is no suggestion that there is a significant immune issue as her blood count was normal.  We can discuss further on follow-up.

## 2024-06-23 NOTE — Telephone Encounter (Signed)
 Copied from CRM [id number]. Topic: Clinical - Lab/Test Results >> Jun 23, 2024 11:10 AM Isabell A wrote: Reason for CRM: Relayed message:Dr. Tamea- No significant allergies detected on blood work.    Patient would like to confirm why her immune system is low, states she catches all sickness in the winter time.   Callback number: [phone]

## 2024-06-25 NOTE — Telephone Encounter (Signed)
 LMTCB. E2C2 please advise when patient calls back.

## 2024-06-30 NOTE — Telephone Encounter (Addendum)
 I have notified the patient. Nothing further needed.

## 2024-07-04 ENCOUNTER — Encounter: Payer: Self-pay | Admitting: Pulmonary Disease

## 2024-07-07 ENCOUNTER — Ambulatory Visit
Admission: RE | Admit: 2024-07-07 | Discharge: 2024-07-07 | Disposition: A | Discharge status: Home / Self Care | Source: Ambulatory Visit | Attending: Family Medicine | Admitting: Family Medicine

## 2024-07-07 DIAGNOSIS — Z1231 Encounter for screening mammogram for malignant neoplasm of breast: Secondary | ICD-10-CM | POA: Insufficient documentation

## 2024-07-15 ENCOUNTER — Ambulatory Visit: Admitting: Pulmonary Disease

## 2024-07-15 DIAGNOSIS — R0602 Shortness of breath: Secondary | ICD-10-CM

## 2024-07-15 DIAGNOSIS — J449 Chronic obstructive pulmonary disease, unspecified: Secondary | ICD-10-CM | POA: Diagnosis not present

## 2024-07-15 LAB — PULMONARY FUNCTION TEST
FEF 25-75 Post: 0.72 L/s
FEF 25-75 Pre: 0.72 L/s
FEF2575-%Change-Post: 0 %
FEF2575-%Pred-Post: 32 %
FEF2575-%Pred-Pre: 32 %
FEV1-%Change-Post: -5 %
FEV1-%Pred-Post: 75 %
FEV1-%Pred-Pre: 80 %
FEV1-Post: 1.75 L
FEV1-Pre: 1.86 L
FEV1FVC-%Change-Post: 0 %
FEV1FVC-%Pred-Pre: 73 %
FEV6-%Change-Post: -5 %
FEV6-%Pred-Post: 105 %
FEV6-%Pred-Pre: 111 %
FEV6-Post: 3.04 L
FEV6-Pre: 3.2 L
FEV6FVC-%Change-Post: 0 %
FEV6FVC-%Pred-Post: 102 %
FEV6FVC-%Pred-Pre: 102 %
FVC-%Change-Post: -4 %
FVC-%Pred-Post: 103 %
FVC-%Pred-Pre: 108 %
FVC-Post: 3.06 L
FVC-Pre: 3.22 L
Post FEV1/FVC ratio: 57 %
Post FEV6/FVC ratio: 99 %
Pre FEV1/FVC ratio: 58 %
Pre FEV6/FVC Ratio: 99 %
RV % pred: 146 %
RV: 2.67 L
TLC % pred: 120 %
TLC: 5.57 L

## 2024-07-15 NOTE — Progress Notes (Signed)
 Full PFT completed today ? ?

## 2024-07-15 NOTE — Patient Instructions (Signed)
 Full PFT completed today ? ?

## 2024-07-29 ENCOUNTER — Ambulatory Visit (INDEPENDENT_AMBULATORY_CARE_PROVIDER_SITE_OTHER)

## 2024-07-29 ENCOUNTER — Ambulatory Visit: Admitting: Podiatry

## 2024-07-29 VITALS — Ht 61.0 in | Wt 133.8 lb

## 2024-07-29 DIAGNOSIS — M79672 Pain in left foot: Secondary | ICD-10-CM | POA: Diagnosis not present

## 2024-07-29 DIAGNOSIS — M79671 Pain in right foot: Secondary | ICD-10-CM

## 2024-07-29 NOTE — Patient Instructions (Addendum)
 VISIT SUMMARY: Today, we discussed your foot pain and swelling, which you described as feeling like your feet are 'on fire' with cramping in your toes. We also reviewed your history of bursitis, neuropathy, and leg swelling. Your recent changes in thyroid medication and COPD management were noted.  YOUR PLAN: -FOOT PAIN WITH MILD OSTEOARTHRITIS: You have mild osteoarthritis in your foot, which means there is some wear and tear in the joints. We did not find any significant pain when moving or touching your foot joints, so surgery or injections are not needed at this time. We recommend exercises to prevent heel pain and suggest long-term physical therapy to avoid future issues with your Achilles tendon and bursitis.  -DIGITAL CONTRACTURES OF TOES (BILATERAL): You have digital contractures, which means your toes are bent in a way that they cross over each other. This is more noticeable on your left foot. Currently, this is not causing pain or affecting your ability to walk. Surgery is the only way to correct this, but it is not recommended unless it starts to cause pain or limit your movement. We suggest wearing open-toed shoes or shoes with plenty of room to avoid irritation.  -BILATERAL LOWER EXTREMITY SWELLING, ETIOLOGY UNDETERMINED: You have swelling in both of your legs, which is likely due to a systemic issue such as circulation, kidney function, heart function, or vein problems. There is no pain associated with the swelling, and no pitting was observed. The discoloration may be due to vein issues or skin staining. We recommend following up with your primary care physician to investigate the cause of the swelling. In the meantime, elevate your legs when you are not on your feet and consider using compression stockings to reduce swelling during the day.  INSTRUCTIONS: Please follow up with your primary care physician to evaluate the systemic causes of your leg swelling. Continue with the exercises  for heel pain prevention and consider long-term physical therapy. Wear open-toed shoes or shoes with ample room to prevent irritation of your toes. Elevate your legs when not on your feet and use compression stockings to reduce swelling during the day.                      Contains text generated by [employer].          Achilles Tendinitis  with Rehab Achilles tendinitis is a disorder of the Achilles tendon. The Achilles tendon connects the large calf muscles (Gastrocnemius and Soleus) to the heel bone (calcaneus). This tendon is sometimes called the heel cord. It is important for pushing-off and standing on your toes and is important for walking, running, or jumping. Tendinitis is often caused by overuse and repetitive microtrauma. SYMPTOMS Pain, tenderness, swelling, warmth, and redness may occur over the Achilles tendon even at rest. Pain with pushing off, or flexing or extending the ankle. Pain that is worsened after or during activity. CAUSES  Overuse sometimes seen with rapid increase in exercise programs or in sports requiring running and jumping. Poor physical conditioning (strength and flexibility or endurance). Running sports, especially training running down hills. Inadequate warm-up before practice or play or failure to stretch before participation. Injury to the tendon. PREVENTION  Warm up and stretch before practice or competition. Allow time for adequate rest and recovery between practices and competition. Keep up conditioning. Keep up ankle and leg flexibility. Improve or keep muscle strength and endurance. Improve cardiovascular fitness. Use proper technique. Use proper equipment (shoes, skates). To help prevent recurrence, taping,  protective strapping, or an adhesive bandage may be recommended for several weeks after healing is complete. PROGNOSIS  Recovery may take weeks to several months to heal. Longer recovery is expected if symptoms have  been prolonged. Recovery is usually quicker if the inflammation is due to a direct blow as compared with overuse or sudden strain. RELATED COMPLICATIONS  Healing time will be prolonged if the condition is not correctly treated. The injury must be given plenty of time to heal. Symptoms can reoccur if activity is resumed too soon. Untreated, tendinitis may increase the risk of tendon rupture requiring additional time for recovery and possibly surgery. TREATMENT  The first treatment consists of rest anti-inflammatory medication, and ice to relieve the pain. Stretching and strengthening exercises after resolution of pain will likely help reduce the risk of recurrence. Referral to a physical therapist or athletic trainer for further evaluation and treatment may be helpful. A walking boot or cast may be recommended to rest the Achilles tendon. This can help break the cycle of inflammation and microtrauma. Arch supports (orthotics) may be prescribed or recommended by your caregiver as an adjunct to therapy and rest. Surgery to remove the inflamed tendon lining or degenerated tendon tissue is rarely necessary and has shown less than predictable results. MEDICATION  Nonsteroidal anti-inflammatory medications, such as aspirin and ibuprofen, may be used for pain and inflammation relief. Do not take within 7 days before surgery. Take these as directed by your caregiver. Contact your caregiver immediately if any bleeding, stomach upset, or signs of allergic reaction occur. Other minor pain relievers, such as acetaminophen , may also be used. Pain relievers may be prescribed as necessary by your caregiver. Do not take prescription pain medication for longer than 4 to 7 days. Use only as directed and only as much as you need. Cortisone injections are rarely indicated. Cortisone injections may weaken tendons and predispose to rupture. It is better to give the condition more time to heal than to use them. HEAT AND  COLD Cold is used to relieve pain and reduce inflammation for acute and chronic Achilles tendinitis. Cold should be applied for 10 to 15 minutes every 2 to 3 hours for inflammation and pain and immediately after any activity that aggravates your symptoms. Use ice packs or an ice massage. Heat may be used before performing stretching and strengthening activities prescribed by your caregiver. Use a heat pack or a warm soak. SEEK MEDICAL CARE IF: Symptoms get worse or do not improve in 2 weeks despite treatment. New, unexplained symptoms develop. Drugs used in treatment may produce side effects.  EXERCISES:  RANGE OF MOTION (ROM) AND STRETCHING EXERCISES - Achilles Tendinitis  These exercises may help you when beginning to rehabilitate your injury. Your symptoms may resolve with or without further involvement from your physician, physical therapist or athletic trainer. While completing these exercises, remember:  Restoring tissue flexibility helps normal motion to return to the joints. This allows healthier, less painful movement and activity. An effective stretch should be held for at least 30 seconds. A stretch should never be painful. You should only feel a gentle lengthening or release in the stretched tissue.  STRETCH  Gastroc, Standing  Place hands on wall. Extend right / left leg, keeping the front knee somewhat bent. Slightly point your toes inward on your back foot. Keeping your right / left heel on the floor and your knee straight, shift your weight toward the wall, not allowing your back to arch. You should feel a gentle  stretch in the right / left calf. Hold this position for 10 seconds. Repeat 3 times. Complete this stretch 2 times per day.  STRETCH  Soleus, Standing  Place hands on wall. Extend right / left leg, keeping the other knee somewhat bent. Slightly point your toes inward on your back foot. Keep your right / left heel on the floor, bend your back knee, and slightly  shift your weight over the back leg so that you feel a gentle stretch deep in your back calf. Hold this position for 10 seconds. Repeat 3 times. Complete this stretch 2 times per day.  STRETCH  Gastrocsoleus, Standing  Note: This exercise can place a lot of stress on your foot and ankle. Please complete this exercise only if specifically instructed by your caregiver.  Place the ball of your right / left foot on a step, keeping your other foot firmly on the same step. Hold on to the wall or a rail for balance. Slowly lift your other foot, allowing your body weight to press your heel down over the edge of the step. You should feel a stretch in your right / left calf. Hold this position for 10 seconds. Repeat this exercise with a slight bend in your knee. Repeat 3 times. Complete this stretch 2 times per day.   STRENGTHENING EXERCISES - Achilles Tendinitis These exercises may help you when beginning to rehabilitate your injury. They may resolve your symptoms with or without further involvement from your physician, physical therapist or athletic trainer. While completing these exercises, remember:  Muscles can gain both the endurance and the strength needed for everyday activities through controlled exercises. Complete these exercises as instructed by your physician, physical therapist or athletic trainer. Progress the resistance and repetitions only as guided. You may experience muscle soreness or fatigue, but the pain or discomfort you are trying to eliminate should never worsen during these exercises. If this pain does worsen, stop and make certain you are following the directions exactly. If the pain is still present after adjustments, discontinue the exercise until you can discuss the trouble with your clinician.  STRENGTH - Plantar-flexors  Sit with your right / left leg extended. Holding onto both ends of a rubber exercise band/tubing, loop it around the ball of your foot. Keep a slight  tension in the band. Slowly push your toes away from you, pointing them downward. Hold this position for 10 seconds. Return slowly, controlling the tension in the band/tubing. Repeat 3 times. Complete this exercise 2 times per day.   STRENGTH - Plantar-flexors  Stand with your feet shoulder width apart. Steady yourself with a wall or table using as little support as needed. Keeping your weight evenly spread over the width of your feet, rise up on your toes.* Hold this position for 10 seconds. Repeat 3 times. Complete this exercise 2 times per day.  *If this is too easy, shift your weight toward your right / left leg until you feel challenged. Ultimately, you may be asked to do this exercise with your right / left foot only.  STRENGTH  Plantar-flexors, Eccentric  Note: This exercise can place a lot of stress on your foot and ankle. Please complete this exercise only if specifically instructed by your caregiver.  Place the balls of your feet on a step. With your hands, use only enough support from a wall or rail to keep your balance. Keep your knees straight and rise up on your toes. Slowly shift your weight entirely to your  right / left toes and pick up your opposite foot. Gently and with controlled movement, lower your weight through your right / left foot so that your heel drops below the level of the step. You will feel a slight stretch in the back of your calf at the end position. Use the healthy leg to help rise up onto the balls of both feet, then lower weight only on the right / left leg again. Build up to 15 repetitions. Then progress to 3 consecutive sets of 15 repetitions.* After completing the above exercise, complete the same exercise with a slight knee bend (about 30 degrees). Again, build up to 15 repetitions. Then progress to 3 consecutive sets of 15 repetitions.* Perform this exercise 2 times per day.  *When you easily complete 3 sets of 15, your physician, physical therapist or  athletic trainer may advise you to add resistance by wearing a backpack filled with additional weight.  STRENGTH - Plantar Flexors, Seated  Sit on a chair that allows your feet to rest flat on the ground. If necessary, sit at the edge of the chair. Keeping your toes firmly on the ground, lift your right / left heel as far as you can without increasing any discomfort in your ankle. Repeat 3 times. Complete this exercise 2 times a day.

## 2024-07-30 NOTE — Progress Notes (Signed)
 Subjective:  Patient ID: Helen Jones, female    DOB: August 01, 1966,  MRN: [id number]  Chief Complaint  Patient presents with   Foot Pain    Rm 3 Patient is here for bilateral foot pain. Pt states pain is primarily located in the achilles aspect of the feet. Pt is concerned about the left/right  index toes crossing over the great toes. Pt states regular burning sensations in the feet, unable to wear regular shoes.    Discussed the use of AI scribe software for clinical note transcription with the patient, who gave verbal consent to proceed.  History of Present Illness Helen Jones is a 58 year old female with COPD who presents with foot pain and swelling.  She experiences significant pain in the back of her heels, describing her feet as feeling 'on fire' and as if something is poking them. Cramping in her toes temporarily immobilizes them. She notes a dark area on both feet and refers to her toes as 'witch toes,' indicating a long-standing deformity in one toe and a similar change occurring in another.  In January, following Christmas, she developed bursitis in the back of her heel. Her foot would swell and hurt daily, improving with elevation overnight. She sought care at an urgent care facility and was placed in a boot. A subsequent visit to a doctor in [place] confirmed the diagnosis of bursitis. She was advised to take Tylenol  and gradually wean off the boot over two to three weeks.  She has a history of swelling in her legs, which she describes as feeling tight at times, particularly in the back of her legs. The swelling is not consistently painful but varies in intensity. It subsides by morning but recurs during the day.  She reports difficulty wearing enclosed shoes due to neuropathy, which causes her feet to feel hot, especially at night. She prefers wearing flip-flops or Crocs without socks, even in winter, to avoid discomfort. She attributes some foot irritation to wearing  steel-toed shoes at work, which she managed by wrapping her feet to prevent movement.  Her medical history includes COPD, for which she has been prescribed prednisone  and antibiotics during exacerbations. Recent changes in her thyroid medication were made, adjusting from 137 mcg to 112 mcg due to low blood pressure readings. She is instructed not to start the new dose until November 1st.      Objective:    Physical Exam VASCULAR: DP and PT pulse palpable. Foot is warm and well-perfused. Capillary fill time is brisk.  Some spider veins noted.  No pitting edema currently. DERMATOLOGIC: Normal skin turgor, texture, and temperature. No open lesions, rashes, or ulcerations. NEUROLOGIC: Normal sensation to light touch and pressure. No paresthesias on examination. ORTHOPEDIC: Smooth, pain-free range of motion of the metatarsal, subtalar joints, and ankle. No ecchymosis, bruising, or gross deformity. No pain to palpation in the foot, Achilles tendon, or heel bilaterally. Digital contractures with crossover toe of the second to the first on the left, less on the right. EXTREMITIES: No pitting edema.   No images are attached to the encounter.    Results RADIOLOGY Foot radiographs: Mild midfoot degenerative changes, forefoot degenerative changes with digital deformities, no posterior spurs, small Haglund's deformity (07/29/2024)   Assessment:   1. Foot pain, bilateral      Plan:  Patient was evaluated and treated and all questions answered.  Assessment and Plan Assessment & Plan Foot pain with osteoarthritis osteoarthritis in the foot with degenerative changes on radiographs. No  significant pain on palpation or movement of the foot joints. No need for surgery or injections at this time. - Provide exercises for heel pain prevention. - Recommend long-term physical therapy to prevent recurrence of Achilles and bursitis issues. -OTC Tylenol  and/or Voltaren gel for pain control  Digital  contractures of toes (bilateral) Bilateral digital contractures with crossover toe of the second to the first on the left and less so on the right. Not currently painful or limiting ambulation. Surgical intervention is the only corrective option, but not recommended unless pain or functional limitation occurs. - Recommend wearing open-toed shoes or shoes with ample room to prevent irritation. -As it is asymptomatic I do not recommend surgical intervention at this point unless it worsens for her.  Bilateral lower extremity swelling, etiology undetermined Bilateral lower extremity swelling without pain. Likely systemic etiology rather than localized foot or ankle issue. Possible causes include circulation changes, kidney function, heart function, or vein dysfunction. No pitting edema observed. Discoloration may be due to vein dysfunction or skin staining. - Recommend follow-up with primary care physician to evaluate systemic causes of swelling. - Advise elevating legs when not on feet to reduce swelling. - Suggest use of compression stockings to prevent swelling during the day.      Return if symptoms worsen or fail to improve.

## 2024-08-03 ENCOUNTER — Other Ambulatory Visit: Payer: Self-pay | Admitting: Family Medicine

## 2024-08-03 DIAGNOSIS — J449 Chronic obstructive pulmonary disease, unspecified: Secondary | ICD-10-CM

## 2024-08-10 ENCOUNTER — Telehealth: Payer: Self-pay

## 2024-08-10 NOTE — Telephone Encounter (Signed)
 Copied from CRM [id number]. Topic: Clinical - Lab/Test Results >> Aug 10, 2024  8:37 AM Devaughn RAMAN wrote: Reason for CRM: Pt called in regarding PFT results. Pt would like a callback regarding this.

## 2024-08-17 NOTE — Telephone Encounter (Signed)
 PFTs are discussed at the time of visit.  However, the PFTs confirm that she has moderate COPD.

## 2024-08-21 ENCOUNTER — Ambulatory Visit
Admission: RE | Admit: 2024-08-21 | Discharge: 2024-08-21 | Disposition: A | Discharge status: Home / Self Care | Source: Ambulatory Visit | Attending: Pulmonary Disease | Admitting: Pulmonary Disease

## 2024-08-21 ENCOUNTER — Encounter: Payer: Self-pay | Admitting: Pulmonary Disease

## 2024-08-21 ENCOUNTER — Ambulatory Visit: Admitting: Pulmonary Disease

## 2024-08-21 ENCOUNTER — Ambulatory Visit: Payer: Self-pay | Admitting: Pulmonary Disease

## 2024-08-21 VITALS — BP 104/60 | HR 80 | Temp 97.9°F | Ht 61.0 in | Wt 134.0 lb

## 2024-08-21 DIAGNOSIS — J440 Chronic obstructive pulmonary disease with acute lower respiratory infection: Secondary | ICD-10-CM | POA: Diagnosis present

## 2024-08-21 DIAGNOSIS — J441 Chronic obstructive pulmonary disease with (acute) exacerbation: Secondary | ICD-10-CM

## 2024-08-21 DIAGNOSIS — F17210 Nicotine dependence, cigarettes, uncomplicated: Secondary | ICD-10-CM | POA: Diagnosis not present

## 2024-08-21 MED ORDER — AMOXICILLIN-POT CLAVULANATE 875-125 MG PO TABS: 1.0000 | ORAL_TABLET | Freq: Two times a day (BID) | ORAL | 0 refills | Status: AC

## 2024-08-21 MED ORDER — PREDNISONE 10 MG (21) PO TBPK
ORAL_TABLET | ORAL | 0 refills | Status: AC
Start: 2024-08-21 — End: ?

## 2024-08-21 NOTE — Progress Notes (Signed)
 Subjective:    Patient ID: Helen Jones, female    DOB: [birth year], 58 y.o.   MRN: [id number]  Patient Care Team: Adele Song, MD as PCP - General  Chief Complaint  Patient presents with   COPD    BACKGROUND/INTERVAL:Patient is a 58 year old current smoker, with a history as noted below, who presents for follow-up of cough and shortness of breath.  She has COPD.  Initially evaluated here on 18 June 2024.  This is a follow-up visit.  She presents with ACUTE SYMPTOMS TODAY.  HPI Discussed the use of AI scribe software for clinical note transcription with the patient, who gave verbal consent to proceed.  History of Present Illness   Helen Jones is a 58 year old female with COPD who presents with an acute exacerbation.  She has been experiencing worsening symptoms since last week, beginning with a cold on Friday. By Tuesday, she developed a fever of 102F and severe coughing, which has led to urinary incontinence. The cough is productive, with yellowish sputum.  In the past, her severe symptoms have been managed with antibiotics and prednisone  at urgent care, which provided relief.  She uses Symbicort  and Spiriva  for COPD management and has a nebulizer at home for as needed use.  She has been contacted for a lung cancer screening program due to her history of smoking and COPD but has not responded to the calls, as she often receives telemarketing calls and does not return them unless a message is left.  She is in the process of applying for disability benefits due to her condition and has a lawyer assisting her with this process.   She underwent PFTs on 15 July 2024 confirming moderate COPD.    DATA 04/29/2024 CXR PA and lateral: Findings consistent with COPD/emphysema, no acute disease.  Degenerative T-spine changes. 07/15/2024 PFTs: FEV1 1.86 L or 80% predicted, FVC 3.22 L or 108% predicted, FEV1/FVC 58%.  No bronchodilator response.  Lung volumes show  hyperinflation and air trapping.  Patient could not perform diffusing capacity reliably.  Consistent with moderate obstructive lung disease.  Review of Systems A 10 point review of systems was performed and it is as noted above otherwise negative.   Patient Active Problem List   Diagnosis Date Noted   History of colonic polyps 10/30/2022   Polyp of descending colon 10/30/2022   Low back pain 09/25/2022   Arm pain, left 03/13/2021   Psoriasis 11/25/2020   Dark stools 04/26/2020   Hyperlipemia 03/25/2020   Chronic obstructive pulmonary disease (HCC) 03/25/2020   Encounter for screening for lung cancer 07/07/2019   Hypernatremia 06/17/2017   Fatigue 05/31/2017   Psoriasis of scalp 05/31/2017   Special screening for malignant neoplasms, colon    Benign neoplasm of ascending colon    Benign neoplasm of transverse colon    Benign neoplasm of sigmoid colon    Allergic rhinitis 12/25/2013   Overweight 04/20/2011   Eczema 04/20/2011   VAGINITIS, ATROPHIC, POSTMENOPAUSAL 08/16/2010   OSTEOARTHRITIS, SACROILIAC JOINT 10/20/2007   Migraine headache 05/12/2007   Anxiety state 01/13/2007   GERD 01/13/2007   Hypothyroidism 12/05/2006   Major depressive disorder, recurrent episode 12/05/2006   Tobacco abuse 12/05/2006    Social History   Tobacco Use   Smoking status: Every Day    Current packs/day: 0.50    Average packs/day: 1.9 packs/day for 44.9 years (84.9 ttl pk-yrs)    Types: Cigarettes    Start date: 10/09/1979    Passive exposure:  Current   Smokeless tobacco: Never  Substance Use Topics   Alcohol use: No    Alcohol/week: 0.0 standard drinks of alcohol    No Known Allergies  Current Meds  Medication Sig   albuterol  (PROVENTIL ) (2.5 MG/3ML) 0.083% nebulizer solution Take 3 mLs (2.5 mg total) by nebulization every 6 (six) hours as needed for wheezing or shortness of breath.   amoxicillin -clavulanate (AUGMENTIN ) 875-125 MG tablet Take 1 tablet by mouth 2 (two) times daily for  7 days.   ciprofloxacin -hydrocortisone  (CIPRO  HC) OTIC suspension Place 3 drops into both ears 2 (two) times daily.   esomeprazole  (NEXIUM ) 40 MG capsule TAKE 1 CAPSULE (40 MG TOTAL) BY MOUTH DAILY AT 12 NOON.   fexofenadine  (ALLEGRA ) 180 MG tablet Take 180 mg by mouth daily.   fluticasone  (FLONASE ) 50 MCG/ACT nasal spray Place 1 spray into both nostrils daily.   levothyroxine  (SYNTHROID ) 112 MCG tablet Take 112 mcg by mouth daily.   pravastatin  (PRAVACHOL ) 40 MG tablet TAKE 1 TABLET (40 MG TOTAL) BY MOUTH DAILY. PLEASE SCHEDULE OFFICE VISIT BEFORE NEXT REFILL.   predniSONE  (STERAPRED UNI-PAK 21 TAB) 10 MG (21) TBPK tablet Take as directed on the package.  This is a taper pack.   SYMBICORT  160-4.5 MCG/ACT inhaler Inhale 2 puffs into the lungs 2 (two) times daily.   Tiotropium Bromide Monohydrate  (SPIRIVA  RESPIMAT) 2.5 MCG/ACT AERS Inhale 2 puffs into the lungs daily.   VENTOLIN  HFA 108 (90 Base) MCG/ACT inhaler INHALE 2 PUFFS BY MOUTH EVERY 4 HOURS AS NEEDED FOR WHEEZE OR FOR SHORTNESS OF BREATH    Immunization History  Administered Date(s) Administered   Td (Adult),unspecified 12/22/2003   Tdap 02/09/2014        Objective:     BP 104/60   Pulse 80   Temp 97.9 F (36.6 C) (Temporal)   Ht 5' 1 (1.549 m)   Wt 134 lb (60.8 kg)   SpO2 97%   BMI 25.32 kg/m   SpO2: 97 %  GENERAL: Well-developed, well-nourished woman, appears acutely ill but nontoxic.  No acute respiratory distress.  Fully ambulatory.  No conversational dyspnea. HEAD: Normocephalic, atraumatic.  EYES: Pupils equal, round, reactive to light.  No scleral icterus.  MOUTH: Dentures upper, partial lower, poor dentition.  Oral mucosa moist. NECK: Supple. No thyromegaly. Trachea midline. No JVD.  No adenopathy. PULMONARY: Good air entry bilaterally.  Coarse, no wheezes noted, few rhonchi at the bases. CARDIOVASCULAR: S1 and S2. Regular rate and rhythm.  No rubs, murmurs or gallops heard. ABDOMEN:  Benign. MUSCULOSKELETAL: No joint deformity, no clubbing, no edema.  Increased AP diameter, thoracic scoliosis NEUROLOGIC: No overt focal deficit, no gait disturbance, speech is fluent. SKIN: Intact,warm,dry.  Eczematous eruption on outer ears bilaterally. PSYCH: Mood and behavior normal.      Assessment & Plan:     ICD-10-CM   1. COPD with acute lower respiratory infection (HCC)  J44.0 DG Chest 2 View    2. Acute exacerbation of chronic obstructive pulmonary disease (COPD) (HCC)  J44.1 DG Chest 2 View    3. Tobacco dependence due to cigarettes  F17.210       Orders Placed This Encounter  Procedures   DG Chest 2 View    Standing Status:   Future    Number of Occurrences:   1    Expiration Date:   10/21/2025    Preferred imaging location?:   [facility]    Reason for exam::   Cough, fever, rule out pneumonia  Meds ordered this encounter  Medications   amoxicillin -clavulanate (AUGMENTIN ) 875-125 MG tablet    Sig: Take 1 tablet by mouth 2 (two) times daily for 7 days.    Dispense:  14 tablet    Refill:  0   predniSONE  (STERAPRED UNI-PAK 21 TAB) 10 MG (21) TBPK tablet    Sig: Take as directed on the package.  This is a taper pack.    Dispense:  21 tablet    Refill:  0   Discussion:    Acute exacerbation of chronic obstructive pulmonary disease with lower respiratory infection Acute exacerbation of COPD likely due to a lower respiratory infection, possibly pneumonia. Symptoms include severe coughing, fever, and yellowish sputum production. Differential diagnosis includes pneumonia, given the presence of fever and productive cough. Previous episodes have been managed with antibiotics and prednisone , which may be necessary again. - Ordered chest x-ray to evaluate for pneumonia, will call with results - Prescribed prednisone  taper - Prescribed Augmentin  875 mg BID - Sent prescriptions to pharmacy     Follow-up in 2 months time. Advised if symptoms do not improve or  worsen, to please contact office for sooner follow up or seek emergency care.    I spent 40 minutes of dedicated to the care of this patient on the date of this encounter to include pre-visit review of records, face-to-face time with the patient discussing conditions above, post visit ordering of testing, clinical documentation with the electronic health record, making appropriate referrals as documented, and communicating necessary findings to members of the patients care team.     C. Leita Sanders, MD Advanced Bronchoscopy PCCM [facility]    *This note was generated using voice recognition software/Dragon and/or AI transcription program.  Despite best efforts to proofread, errors can occur which can change the meaning. Any transcriptional errors that result from this process are unintentional and may not be fully corrected at the time of dictation.

## 2024-08-21 NOTE — Patient Instructions (Signed)
 VISIT SUMMARY:  Today, you were seen for an acute exacerbation of your chronic obstructive pulmonary disease (COPD), which has worsened over the past week. You developed a fever and severe coughing, which has led to urinary incontinence. Your symptoms are similar to previous episodes that were managed with antibiotics and prednisone .  YOUR PLAN:  -ACUTE EXACERBATION OF CHRONIC OBSTRUCTIVE PULMONARY DISEASE (COPD) WITH LOWER RESPIRATORY INFECTION: An acute exacerbation of COPD means that your COPD symptoms have suddenly worsened, likely due to a lower respiratory infection such as pneumonia. This is causing your severe coughing, fever, and yellowish sputum. We have ordered a chest x-ray to check for pneumonia, prescribed a prednisone  taper to reduce inflammation, and prescribed Augmentin  875 mg to be taken twice daily to treat the infection.  INSTRUCTIONS:  Please get a chest x-ray as soon as possible to check for pneumonia. Take the prescribed prednisone  and Augmentin  as directed. If your symptoms do not improve or worsen, please contact our office immediately. Additionally, please respond to the lung cancer screening program calls to schedule your screening.  Please follow-up with a lung cancer screening program to be enrolled in this this is a yearly program to screen for lung cancer.  Please quit smoking.

## 2024-08-25 ENCOUNTER — Telehealth: Payer: Self-pay | Admitting: Acute Care

## 2024-08-25 DIAGNOSIS — F17210 Nicotine dependence, cigarettes, uncomplicated: Secondary | ICD-10-CM

## 2024-08-25 DIAGNOSIS — Z87891 Personal history of nicotine dependence: Secondary | ICD-10-CM

## 2024-08-25 DIAGNOSIS — Z122 Encounter for screening for malignant neoplasm of respiratory organs: Secondary | ICD-10-CM

## 2024-08-25 NOTE — Telephone Encounter (Signed)
 Lung Cancer Screening Narrative/Criteria Questionnaire (Cigarette Smokers Only- No Cigars/Pipes/vapes)   Helen Jones   SDMV:09/10/2024 11:00 [name]       03/21/1966   LDCT: 09/11/2024 3:10p [facility]    57 y.o.   Phone: [phone]  Lung Screening Narrative (confirm age 43-77 yrs Medicare / 50-80 yrs Private pay insurance)   Insurance information:mcd   Referring Provider:Dr. Tamea   This screening involves an initial phone call with a team member from our program. It is called a shared decision making visit. The initial meeting is required by  insurance and Medicare to make sure you understand the program. This appointment takes about 15-20 minutes to complete. You will complete the screening scan at your scheduled date/time.  This scan takes about 5-10 minutes to complete. You can eat and drink normally before and after the scan.  Criteria questions for Lung Cancer Screening:   Are you a current or former smoker? Current Age began smoking: 58yo   If you are a former smoker, what year did you quit smoking? N/A(within 15 yrs)   To calculate your smoking history, I need an accurate estimate of how many packs of cigarettes you smoked per day and for how many years. (Not just the number of PPD you are now smoking)   Years smoking 45 x Packs per day 1.75 = Pack years 78.75   (at least 20 pack yrs)   (Make sure they understand that we need to know how much they have smoked in the past, not just the number of PPD they are smoking now)  Do you have a personal history of cancer?  No    Do you have a family history of cancer? Yes  (cancer type and and relative) Father - liver, sister - liver, mother - stomach   Are you coughing up blood?  No  Have you had unexplained weight loss of 15 lbs or more in the last 6 months? No  It looks like you meet all criteria.  When would be a good time for us  to schedule you for this screening?   Additional information: N/A

## 2024-09-10 ENCOUNTER — Ambulatory Visit: Admitting: *Deleted

## 2024-09-10 ENCOUNTER — Encounter: Payer: Self-pay | Admitting: *Deleted

## 2024-09-10 DIAGNOSIS — F17210 Nicotine dependence, cigarettes, uncomplicated: Secondary | ICD-10-CM

## 2024-09-10 NOTE — Patient Instructions (Signed)

## 2024-09-10 NOTE — Progress Notes (Signed)
 Virtual Visit via Telephone Note  I connected with Helen Jones on 09/10/24 at 11:00 AM EST by telephone and verified that I am speaking with the correct person using two identifiers.  Location: Patient: at home Provider: [address] [place], [place], [address]    I discussed the limitations, risks, security and privacy concerns of performing an evaluation and management service by telephone and the availability of in person appointments. I also discussed with the patient that there may be a patient responsible charge related to this service. The patient expressed understanding and agreed to proceed.    Shared Decision Making Visit Lung Cancer Screening Program [phone])   Eligibility: Age 58 y.o. Pack Years Smoking History Calculation 58 (# packs/per year x # years smoked) Recent History of coughing up blood  no Unexplained weight loss? no ( >Than 15 pounds within the last 6 months ) Prior History Lung / other cancer no (Diagnosis within the last 5 years already requiring surveillance chest CT Scans). Smoking Status Current Smoker Former Smokers: Years since quit: n/a  Quit Date: n/a  Visit Components: Discussion included one or more decision making aids. yes Discussion included risk/benefits of screening. yes Discussion included potential follow up diagnostic testing for abnormal scans. yes Discussion included meaning and risk of over diagnosis. yes Discussion included meaning and risk of False Positives. yes Discussion included meaning of total radiation exposure. yes  Counseling Included: Importance of adherence to annual lung cancer LDCT screening. yes Impact of comorbidities on ability to participate in the program. yes Ability and willingness to under diagnostic treatment. yes  Smoking Cessation Counseling: Current Smokers:  Discussed importance of smoking cessation. yes Information about tobacco cessation classes and interventions provided to patient.  yes Patient provided with ticket for LDCT Scan. no Symptomatic Patient. no   Diagnosis Code: Tobacco Use Z72.0 Asymptomatic Patient yes Smoking/Tobacco Cessation Counseling Helen Jones is a current user of tobacco or nicotine  products. She is ready to quit at this time. Counseling provided today addressed the risks of continued use and the benefits of cessation. Discussed tobacco/nicotine  use history, readiness to quit, and evidence-based treatment options including behavioral strategies, support resources, and pharmacologic therapies. Provided encouragement and educational materials on steps and resources to quit smoking. Patient questions were addressed, and follow-up recommended for continued support. Total time spent on counseling: 4 minutes.    Information about tobacco cessation classes and interventions provided to patient. Yes Patient provided with ticket for LDCT Scan. no Written Order for Lung Cancer Screening with LDCT placed in Epic. Yes (CT Chest Lung Cancer Screening Low Dose W/O CM) [id number] Z12.2-Screening of respiratory organs Z87.891-Personal history of nicotine  dependence   Laneta Speaks, RN

## 2024-09-11 ENCOUNTER — Ambulatory Visit
Admission: RE | Admit: 2024-09-11 | Discharge: 2024-09-11 | Disposition: A | Source: Ambulatory Visit | Attending: Acute Care | Admitting: Acute Care

## 2024-09-11 DIAGNOSIS — Z87891 Personal history of nicotine dependence: Secondary | ICD-10-CM

## 2024-09-11 DIAGNOSIS — Z122 Encounter for screening for malignant neoplasm of respiratory organs: Secondary | ICD-10-CM

## 2024-09-11 DIAGNOSIS — F17210 Nicotine dependence, cigarettes, uncomplicated: Secondary | ICD-10-CM

## 2024-09-17 ENCOUNTER — Other Ambulatory Visit: Payer: Self-pay

## 2024-09-17 DIAGNOSIS — F17210 Nicotine dependence, cigarettes, uncomplicated: Secondary | ICD-10-CM

## 2024-09-17 DIAGNOSIS — Z87891 Personal history of nicotine dependence: Secondary | ICD-10-CM

## 2024-09-17 DIAGNOSIS — Z122 Encounter for screening for malignant neoplasm of respiratory organs: Secondary | ICD-10-CM

## 2024-10-22 ENCOUNTER — Ambulatory Visit: Admitting: Pulmonary Disease

## 2024-10-22 ENCOUNTER — Encounter: Payer: Self-pay | Admitting: Pulmonary Disease

## 2024-10-22 VITALS — BP 130/90 | HR 74 | Ht 61.0 in | Wt 130.2 lb

## 2024-10-22 DIAGNOSIS — J439 Emphysema, unspecified: Secondary | ICD-10-CM

## 2024-10-22 DIAGNOSIS — F17210 Nicotine dependence, cigarettes, uncomplicated: Secondary | ICD-10-CM

## 2024-10-22 NOTE — Progress Notes (Signed)
 "  Subjective:    Patient ID: Helen Jones, female    DOB: [birth year], 59 y.o.   MRN: [id number]  Patient Care Team: Adele Song, MD as PCP - General  Chief Complaint  Patient presents with   Follow-up    BACKGROUND/INTERVAL:Patient is a 59 year old current smoker, with a history as noted below, who presents for follow-up of cough and shortness of breath.  She has COPD.  Initially evaluated here on 18 June 2024.  She was last seen on 21 August 2024 as an acute visit.  She is on Symbicort  and Spiriva .   HPI Discussed the use of AI scribe software for clinical note transcription with the patient, who gave verbal consent to proceed.  History of Present Illness   Helen Jones is a 59 year old female, current smoker, with COPD who presents for follow-up.  She continues to smoke approximately five cigarettes a day. She experiences shortness of breath, particularly when walking to her mailbox and back, and often feels out of breath. She uses Symbicort  and Spiriva  inhalers, which help manage her symptoms. She also takes Mucinex, particularly at night after using her inhaler, to help with mucus clearance.  She has a history of emphysema and reports that her CT scan showed emphysema. Her lung function tests from October were reviewed during the visit. She frequently coughs up mucus, which she describes as feeling 'stuck right there'.  In addition to her respiratory issues, she is facing orthopedic challenges. She has been advised to undergo knee replacement surgery and toe surgery. She has had x-rays showing deterioration and arthritis in her knee. She received a cortisone shot in her knee. She also mentions a potential need for hip surgery, pending further evaluation.  Her social history includes a significant smoking history, and she is working with a clinical research associate to obtain disability benefits due to her multiple health issues.     DATA 04/29/2024 CXR PA and lateral: Findings  consistent with COPD/emphysema, no acute disease.  Degenerative T-spine changes. 07/15/2024 PFTs: FEV1 1.86 L or 80% predicted, FVC 3.22 L or 108% predicted, FEV1/FVC 58%.  No bronchodilator response.  Lung volumes show hyperinflation and air trapping.  Patient could not perform diffusing capacity reliably.  Consistent with moderate obstructive lung disease. 09/11/2024 LDCT chest: Trace secretions within the airways.  Mild diffuse bronchial thickening.  Severe upper lobe predominant paraseptal and centrilobular emphysema.  Scattered pulmonary nodules measuring up to 3.2 mm in the medial left upper lobe.  RADS 2, continue annual screening.   Review of Systems A 10 point review of systems was performed and it is as noted above otherwise negative.   Patient Active Problem List   Diagnosis Date Noted   History of colonic polyps 10/30/2022   Polyp of descending colon 10/30/2022   Low back pain 09/25/2022   Arm pain, left 03/13/2021   Psoriasis 11/25/2020   Dark stools 04/26/2020   Hyperlipemia 03/25/2020   Chronic obstructive pulmonary disease (HCC) 03/25/2020   Encounter for screening for lung cancer 07/07/2019   Hypernatremia 06/17/2017   Fatigue 05/31/2017   Psoriasis of scalp 05/31/2017   Special screening for malignant neoplasms, colon    Benign neoplasm of ascending colon    Benign neoplasm of transverse colon    Benign neoplasm of sigmoid colon    Allergic rhinitis 12/25/2013   Overweight 04/20/2011   Eczema 04/20/2011   VAGINITIS, ATROPHIC, POSTMENOPAUSAL 08/16/2010   OSTEOARTHRITIS, SACROILIAC JOINT 10/20/2007   Migraine headache 05/12/2007   Anxiety  state 01/13/2007   GERD 01/13/2007   Hypothyroidism 12/05/2006   Major depressive disorder, recurrent episode 12/05/2006   Tobacco abuse 12/05/2006    Social History   Tobacco Use   Smoking status: Every Day    Current packs/day: 0.50    Average packs/day: 1.7 packs/day for 46.0 years (76.4 ttl pk-yrs)    Types:  Cigarettes    Start date: 10/08/1978    Passive exposure: Current   Smokeless tobacco: Never  Substance Use Topics   Alcohol use: No    Alcohol/week: 0.0 standard drinks of alcohol    Allergies[1]  Active Medications[2]  Immunization History  Administered Date(s) Administered   Td (Adult),unspecified 12/22/2003   Tdap 02/09/2014        Objective:     Vitals:   10/22/24 1510  BP: (!) 130/90  Pulse: 74  Height: 5' 1 (1.549 m)  Weight: 130 lb 4 oz (59.1 kg)  SpO2: 98%  BMI (Calculated): 24.62     SpO2: 98 %  GENERAL: Well-developed, well-nourished woman, appears acutely ill but nontoxic.  No acute respiratory distress.  Fully ambulatory.  No conversational dyspnea. HEAD: Normocephalic, atraumatic.  EYES: Pupils equal, round, reactive to light.  No scleral icterus.  MOUTH: Dentures upper, partial lower, poor dentition.  Oral mucosa moist. NECK: Supple. No thyromegaly. Trachea midline. No JVD.  No adenopathy. PULMONARY: Good air entry bilaterally.  Coarse, otherwise no adventitious sounds. CARDIOVASCULAR: S1 and S2. Regular rate and rhythm.  No rubs, murmurs or gallops heard. ABDOMEN: Benign. MUSCULOSKELETAL: No joint deformity, no clubbing, no edema.  Increased AP diameter, thoracic scoliosis NEUROLOGIC: No overt focal deficit, no gait disturbance, speech is fluent. SKIN: Intact,warm,dry.  Eczematous eruption on outer ears bilaterally. PSYCH: Mood and behavior normal.        Assessment & Plan:     ICD-10-CM   1. COPD with chronic bronchitis and emphysema (HCC)  J44.89    J43.9     2. Tobacco dependence due to cigarettes  F17.210      Discussion:    Chronic obstructive pulmonary disease with emphysema Advanced emphysema with marked changes on CT scan, particularly in the upper lung zones. Lung function is moderate with improvement post-inhaler use. Persistent mucus production and cough. Continues to smoke, contributing to disease progression. - Continue  Symbicort  and Spiriva  inhalers. - Use Mucinex for mucus management. - Prescribed Ohtuvayre  via nebulizer twice daily through specialty pharmacy. - Ordered new nebulizer with appropriate cup for Ohtuvayre . - Continue prednisone  and antibiotics as needed for exacerbations.  Nicotine  dependence Continues to smoke approximately five cigarettes per day. Smoking cessation is critical to prevent further lung deterioration. - Encouraged smoking cessation to stabilize lung function and prevent further deterioration.     Follow-up in 2 months time.  Advised if symptoms do not improve or worsen, to please contact office for sooner follow up or seek emergency care.    I spent 41 minutes of dedicated to the care of this patient on the date of this encounter to include pre-visit review of records, face-to-face time with the patient discussing conditions above, post visit ordering of testing, clinical documentation with the electronic health record, making appropriate referrals as documented, and communicating necessary findings to members of the patients care team.     C. Leita Sanders, MD Advanced Bronchoscopy PCCM [facility]    *This note was generated using voice recognition software/Dragon and/or AI transcription program.  Despite best efforts to proofread, errors can occur which can change the meaning.  Any transcriptional errors that result from this process are unintentional and may not be fully corrected at the time of dictation.     [1] No Known Allergies [2]  Current Meds  Medication Sig   albuterol  (PROVENTIL ) (2.5 MG/3ML) 0.083% nebulizer solution Take 3 mLs (2.5 mg total) by nebulization every 6 (six) hours as needed for wheezing or shortness of breath.   ciprofloxacin -hydrocortisone  (CIPRO  HC) OTIC suspension Place 3 drops into both ears 2 (two) times daily.   esomeprazole  (NEXIUM ) 40 MG capsule TAKE 1 CAPSULE (40 MG TOTAL) BY MOUTH DAILY AT 12 NOON.    fexofenadine  (ALLEGRA ) 180 MG tablet Take 180 mg by mouth daily.   fluticasone  (FLONASE ) 50 MCG/ACT nasal spray Place 1 spray into both nostrils daily.   levothyroxine  (SYNTHROID ) 112 MCG tablet Take 112 mcg by mouth daily.   pravastatin  (PRAVACHOL ) 40 MG tablet TAKE 1 TABLET (40 MG TOTAL) BY MOUTH DAILY. PLEASE SCHEDULE OFFICE VISIT BEFORE NEXT REFILL.   SYMBICORT  160-4.5 MCG/ACT inhaler Inhale 2 puffs into the lungs 2 (two) times daily.   Tiotropium Bromide Monohydrate  (SPIRIVA  RESPIMAT) 2.5 MCG/ACT AERS Inhale 2 puffs into the lungs daily.   VENTOLIN  HFA 108 (90 Base) MCG/ACT inhaler INHALE 2 PUFFS BY MOUTH EVERY 4 HOURS AS NEEDED FOR WHEEZE OR FOR SHORTNESS OF BREATH   "

## 2024-10-22 NOTE — Patient Instructions (Signed)
 VISIT SUMMARY:  During today's visit, we discussed your ongoing respiratory issues related to COPD and emphysema, as well as your orthopedic challenges. We reviewed your lung function tests and CT scan results, and we talked about your current medications and their effectiveness. We also addressed your smoking habits and the importance of smoking cessation.  YOUR PLAN:  -CHRONIC OBSTRUCTIVE PULMONARY DISEASE WITH EMPHYSEMA: COPD with emphysema is a chronic lung condition that causes breathing difficulties and is characterized by damage to the air sacs in the lungs. You should continue using Symbicort  and Spiriva  inhalers, take Mucinex for mucus management, and use Ohtuvayre  via nebulizer twice daily. A new nebulizer with the appropriate cup for Ohtuvayre  has been ordered. Continue using prednisone  and antibiotics as needed for exacerbations.  -NICOTINE  DEPENDENCE: Nicotine  dependence is an addiction to tobacco products. It is important to quit smoking to stabilize your lung function and prevent further deterioration. You are encouraged to stop smoking to improve your overall health.  INSTRUCTIONS:  Please continue using your prescribed medications as directed. Use the new nebulizer with Ohtuvayre  twice daily. If you experience any exacerbations, use prednisone  and antibiotics as needed. It is crucial to work on smoking cessation to prevent further lung damage. Follow up with your orthopedic surgeon regarding your knee and potential hip surgery. If you have any questions or concerns, please contact our office.

## 2024-10-23 ENCOUNTER — Encounter: Payer: Self-pay | Admitting: Pulmonary Disease

## 2024-10-27 ENCOUNTER — Other Ambulatory Visit: Payer: Self-pay | Admitting: Gastroenterology

## 2024-10-29 ENCOUNTER — Telehealth: Payer: Self-pay

## 2024-10-29 ENCOUNTER — Other Ambulatory Visit (HOSPITAL_COMMUNITY): Payer: Self-pay

## 2024-10-29 NOTE — Addendum Note (Signed)
 Addended by: VICCI EVALENE DEL on: 10/29/2024 10:57 AM   Modules accepted: Orders

## 2024-10-29 NOTE — Telephone Encounter (Signed)
 Received Ohtuvayre  new start paperwork. Completed form and faxed with clinicals and insurance card copy to [facility]   Phone#: [phone] Fax#: [phone]

## 2024-10-29 NOTE — Telephone Encounter (Signed)
 Referral has been placed. I have notified the patient that the pharmacy team is working on it.That they are a little behind due to the new year and having to do prior authorizations. She will wait for their call.  Nothing further needed.

## 2024-10-29 NOTE — Telephone Encounter (Signed)
 Copied from CRM [id number]. Topic: Clinical - Prescription Issue >> Oct 29, 2024  9:21 AM Joesph PARAS wrote: Reason for CRM: Patient states has not heard regarding new nebulizer and OHTUVRAY medication. Per last office note, has already been ordered but med not on list and patient has not heard from DME about nebulizer. Patient requesting and update.

## 2024-10-30 NOTE — Telephone Encounter (Signed)
 Received fax from [employer] Confirming receipt of enrollment form  Patient ID: [id number]

## 2024-10-31 NOTE — Telephone Encounter (Signed)
 Rx for Ohtuvayre  sent to [facility]  Pharmacy: [phone]

## 2024-11-02 NOTE — Telephone Encounter (Signed)
 Received fax from [employer] with summary of benefits. Referral form for Ohtuvayre  received. Rx will be triaged to [facility] (phone: [phone]). Once benefits investigation completed, pharmacy will reach out the patient to schedule shipment. If medication is unaffordable, patient will need to express financial hardship to be referred back to [facility] for patient assistance program pre-screening.   Patient ID: [id number] Pharmacy phone: [facility] (phone: [phone]) [facility] Phone#: [phone]

## 2024-12-22 ENCOUNTER — Ambulatory Visit: Admitting: Pulmonary Disease
